# Patient Record
Sex: Female | Born: 2000 | Race: Black or African American | Hispanic: No | Marital: Single | Smoking: Former smoker
Health system: Southern US, Community
[De-identification: ages and names within clinical notes are randomized; demographics above are authoritative.]

## PROBLEM LIST (undated history)

## (undated) ENCOUNTER — Inpatient Hospital Stay (HOSPITAL_COMMUNITY): Payer: Self-pay

## (undated) DIAGNOSIS — J45909 Unspecified asthma, uncomplicated: Secondary | ICD-10-CM

## (undated) DIAGNOSIS — G43909 Migraine, unspecified, not intractable, without status migrainosus: Secondary | ICD-10-CM

## (undated) DIAGNOSIS — O139 Gestational [pregnancy-induced] hypertension without significant proteinuria, unspecified trimester: Secondary | ICD-10-CM

## (undated) HISTORY — PX: WISDOM TOOTH EXTRACTION: SHX21

## (undated) HISTORY — DX: Gestational (pregnancy-induced) hypertension without significant proteinuria, unspecified trimester: O13.9

## (undated) NOTE — *Deleted (*Deleted)
Checking on the patient at 1050 she tells me she is not feeling well. States back pain, nausea and dizziness.  Second dose of feraheme was completed at 1038. VS taken with blood pressure slightly lower that admission at 96/69 pulse 96 O2 sat at 100. 1 L NS started at 1055. Call placed to MD. Orders received at 1105 and Zofran given as ordered.  Fetal heart rate at 140 at 1057.  No emesis at this time but pt remains nauseous and dizzy.   1148 BP 91/66, P77, O2 sat 99. Pt states nausea is slightly better but is very sleepy at this point. Will continue to monitor  1220 Pt asked to get up to the bathroom. When she was up she stated that her back pain continues on left side along with numbness and aching in upper legs and lower abdomen. Nausea and sleepyness seem. better but she is chilling. VS 101/60 Pulse 76 Resp 18 Sats 100%. Called Dr Adrian Blackwater and he said to bring her to maturity assessment unit.  Brought pt over there at 1245 and report called to Imagene Sheller, RN

---
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
## 2020-01-04 ENCOUNTER — Encounter (HOSPITAL_COMMUNITY): Payer: Self-pay | Admitting: Family Medicine

## 2020-01-04 ENCOUNTER — Inpatient Hospital Stay (HOSPITAL_COMMUNITY)
Admission: AD | Admit: 2020-01-04 | Discharge: 2020-01-07 | DRG: 832 | Disposition: A | Discharge status: Home / Self Care | Payer: Medicaid Other | Attending: Family Medicine | Admitting: Family Medicine

## 2020-01-04 ENCOUNTER — Other Ambulatory Visit: Payer: Self-pay

## 2020-01-04 DIAGNOSIS — Z1639 Resistance to other specified antimicrobial drug: Secondary | ICD-10-CM | POA: Diagnosis not present

## 2020-01-04 DIAGNOSIS — A549 Gonococcal infection, unspecified: Secondary | ICD-10-CM | POA: Diagnosis present

## 2020-01-04 DIAGNOSIS — A568 Sexually transmitted chlamydial infection of other sites: Secondary | ICD-10-CM

## 2020-01-04 DIAGNOSIS — R8271 Bacteriuria: Secondary | ICD-10-CM

## 2020-01-04 DIAGNOSIS — Z20822 Contact with and (suspected) exposure to covid-19: Secondary | ICD-10-CM | POA: Diagnosis present

## 2020-01-04 DIAGNOSIS — O2302 Infections of kidney in pregnancy, second trimester: Secondary | ICD-10-CM | POA: Diagnosis not present

## 2020-01-04 DIAGNOSIS — O98212 Gonorrhea complicating pregnancy, second trimester: Secondary | ICD-10-CM | POA: Diagnosis present

## 2020-01-04 DIAGNOSIS — O09892 Supervision of other high risk pregnancies, second trimester: Secondary | ICD-10-CM | POA: Diagnosis not present

## 2020-01-04 DIAGNOSIS — N390 Urinary tract infection, site not specified: Secondary | ICD-10-CM | POA: Diagnosis not present

## 2020-01-04 DIAGNOSIS — Z3A23 23 weeks gestation of pregnancy: Secondary | ICD-10-CM

## 2020-01-04 DIAGNOSIS — O09212 Supervision of pregnancy with history of pre-term labor, second trimester: Secondary | ICD-10-CM | POA: Diagnosis not present

## 2020-01-04 DIAGNOSIS — B964 Proteus (mirabilis) (morganii) as the cause of diseases classified elsewhere: Secondary | ICD-10-CM | POA: Diagnosis not present

## 2020-01-04 DIAGNOSIS — O09899 Supervision of other high risk pregnancies, unspecified trimester: Secondary | ICD-10-CM

## 2020-01-04 DIAGNOSIS — O09299 Supervision of pregnancy with other poor reproductive or obstetric history, unspecified trimester: Secondary | ICD-10-CM

## 2020-01-04 DIAGNOSIS — R509 Fever, unspecified: Secondary | ICD-10-CM | POA: Diagnosis not present

## 2020-01-04 DIAGNOSIS — Z3480 Encounter for supervision of other normal pregnancy, unspecified trimester: Secondary | ICD-10-CM

## 2020-01-04 DIAGNOSIS — IMO0002 Reserved for concepts with insufficient information to code with codable children: Secondary | ICD-10-CM

## 2020-01-04 DIAGNOSIS — Z362 Encounter for other antenatal screening follow-up: Secondary | ICD-10-CM | POA: Diagnosis not present

## 2020-01-04 DIAGNOSIS — O99012 Anemia complicating pregnancy, second trimester: Secondary | ICD-10-CM | POA: Diagnosis not present

## 2020-01-04 DIAGNOSIS — D649 Anemia, unspecified: Secondary | ICD-10-CM | POA: Diagnosis not present

## 2020-01-04 DIAGNOSIS — Z1629 Resistance to other single specified antibiotic: Secondary | ICD-10-CM | POA: Diagnosis present

## 2020-01-04 DIAGNOSIS — O368320 Maternal care for abnormalities of the fetal heart rate or rhythm, second trimester, not applicable or unspecified: Secondary | ICD-10-CM | POA: Diagnosis present

## 2020-01-04 DIAGNOSIS — O09292 Supervision of pregnancy with other poor reproductive or obstetric history, second trimester: Secondary | ICD-10-CM | POA: Diagnosis not present

## 2020-01-04 LAB — CBC
HCT: 31.3 % — ABNORMAL LOW (ref 36.0–46.0)
Hemoglobin: 10.3 g/dL — ABNORMAL LOW (ref 12.0–15.0)
MCH: 28.5 pg (ref 26.0–34.0)
MCHC: 32.9 g/dL (ref 30.0–36.0)
MCV: 86.5 fL (ref 80.0–100.0)
Platelets: 283 10*3/uL (ref 150–400)
RBC: 3.62 MIL/uL — ABNORMAL LOW (ref 3.87–5.11)
RDW: 11.6 % (ref 11.5–15.5)
WBC: 14.3 10*3/uL — ABNORMAL HIGH (ref 4.0–10.5)
nRBC: 0 % (ref 0.0–0.2)

## 2020-01-04 LAB — DIFFERENTIAL
Abs Immature Granulocytes: 0.08 10*3/uL — ABNORMAL HIGH (ref 0.00–0.07)
Basophils Absolute: 0 10*3/uL (ref 0.0–0.1)
Basophils Relative: 0 %
Eosinophils Absolute: 0 10*3/uL (ref 0.0–0.5)
Eosinophils Relative: 0 %
Immature Granulocytes: 1 %
Lymphocytes Relative: 7 %
Lymphs Abs: 1.1 10*3/uL (ref 0.7–4.0)
Monocytes Absolute: 1.1 10*3/uL — ABNORMAL HIGH (ref 0.1–1.0)
Monocytes Relative: 8 %
Neutro Abs: 12 10*3/uL — ABNORMAL HIGH (ref 1.7–7.7)
Neutrophils Relative %: 84 %

## 2020-01-04 LAB — COMPREHENSIVE METABOLIC PANEL
ALT: 10 U/L (ref 0–44)
AST: 12 U/L — ABNORMAL LOW (ref 15–41)
Albumin: 2.8 g/dL — ABNORMAL LOW (ref 3.5–5.0)
Alkaline Phosphatase: 45 U/L (ref 38–126)
Anion gap: 12 (ref 5–15)
BUN: <5 mg/dL — ABNORMAL LOW (ref 6–20)
CO2: 19 mmol/L — ABNORMAL LOW (ref 22–32)
Calcium: 8.6 mg/dL — ABNORMAL LOW (ref 8.9–10.3)
Chloride: 99 mmol/L (ref 98–111)
Creatinine, Ser: 0.74 mg/dL (ref 0.44–1.00)
GFR calc Af Amer: >60 mL/min (ref >60)
GFR calc non Af Amer: >60 mL/min (ref >60)
Glucose, Bld: 82 mg/dL (ref 70–99)
Potassium: 3.2 mmol/L — ABNORMAL LOW (ref 3.5–5.1)
Sodium: 130 mmol/L — ABNORMAL LOW (ref 135–145)
Total Bilirubin: 1 mg/dL (ref 0.3–1.2)
Total Protein: 6.4 g/dL — ABNORMAL LOW (ref 6.5–8.1)

## 2020-01-04 LAB — URINALYSIS, ROUTINE W REFLEX MICROSCOPIC
Bilirubin Urine: NEGATIVE
Glucose, UA: NEGATIVE mg/dL
Hgb urine dipstick: NEGATIVE
Ketones, ur: 80 mg/dL — AB
Nitrite: NEGATIVE
Protein, ur: 30 mg/dL — AB
Specific Gravity, Urine: 1.024 (ref 1.005–1.030)
pH: 7 (ref 5.0–8.0)

## 2020-01-04 LAB — TYPE AND SCREEN
ABO/RH(D): A POS
Antibody Screen: NEGATIVE

## 2020-01-04 LAB — LACTIC ACID, PLASMA: Lactic Acid, Venous: 0.9 mmol/L (ref 0.5–1.9)

## 2020-01-04 LAB — WET PREP, GENITAL
Clue Cells Wet Prep HPF POC: NONE SEEN
Sperm: NONE SEEN
Trich, Wet Prep: NONE SEEN
Yeast Wet Prep HPF POC: NONE SEEN

## 2020-01-04 LAB — SARS CORONAVIRUS 2 BY RT PCR (HOSPITAL ORDER, PERFORMED IN ~~LOC~~ HOSPITAL LAB): SARS Coronavirus 2: NEGATIVE

## 2020-01-04 MED ORDER — LACTATED RINGERS IV SOLN: INTRAVENOUS | Status: DC

## 2020-01-04 MED ORDER — ACETAMINOPHEN 325 MG PO TABS
650.0000 mg | ORAL_TABLET | ORAL | Status: DC | PRN
  Administered 2020-01-05 – 2020-01-06 (×3): 650 mg via ORAL
  Filled 2020-01-04 (×3): qty 2

## 2020-01-04 MED ORDER — PRENATAL MULTIVITAMIN CH
1.0000 | ORAL_TABLET | Freq: Every day | ORAL | Status: DC
  Administered 2020-01-05 – 2020-01-07 (×3): 1 via ORAL
  Filled 2020-01-04 (×3): qty 1

## 2020-01-04 MED ORDER — DOCUSATE SODIUM 100 MG PO CAPS
100.0000 mg | ORAL_CAPSULE | Freq: Every day | ORAL | Status: DC
  Administered 2020-01-05: 100 mg via ORAL
  Filled 2020-01-04 (×2): qty 1

## 2020-01-04 MED ORDER — ENOXAPARIN SODIUM 40 MG/0.4ML ~~LOC~~ SOLN
40.0000 mg | Freq: Every day | SUBCUTANEOUS | Status: DC
  Administered 2020-01-05 – 2020-01-06 (×3): 40 mg via SUBCUTANEOUS
  Filled 2020-01-04 (×3): qty 0.4

## 2020-01-04 MED ORDER — CALCIUM CARBONATE ANTACID 500 MG PO CHEW
2.0000 | CHEWABLE_TABLET | ORAL | Status: DC | PRN
  Administered 2020-01-06: 400 mg via ORAL
  Filled 2020-01-04: qty 2

## 2020-01-04 MED ORDER — ASPIRIN EC 81 MG PO TBEC
81.0000 mg | DELAYED_RELEASE_TABLET | Freq: Every day | ORAL | Status: DC
  Administered 2020-01-05 – 2020-01-07 (×3): 81 mg via ORAL
  Filled 2020-01-04 (×3): qty 1

## 2020-01-04 MED ORDER — ZOLPIDEM TARTRATE 5 MG PO TABS: 5.0000 mg | ORAL_TABLET | Freq: Every evening | ORAL | Status: DC | PRN

## 2020-01-04 MED ORDER — ACETAMINOPHEN 500 MG PO TABS
1000.0000 mg | ORAL_TABLET | Freq: Once | ORAL | Status: AC
  Administered 2020-01-04: 1000 mg via ORAL
  Filled 2020-01-04: qty 2

## 2020-01-04 MED ORDER — SODIUM CHLORIDE 0.9 % IV SOLN
2.0000 g | INTRAVENOUS | Status: DC
  Administered 2020-01-04 – 2020-01-06 (×3): 2 g via INTRAVENOUS
  Filled 2020-01-04: qty 2
  Filled 2020-01-04 (×3): qty 20

## 2020-01-04 MED ORDER — IBUPROFEN 600 MG PO TABS
600.0000 mg | ORAL_TABLET | Freq: Four times a day (QID) | ORAL | Status: DC | PRN
  Administered 2020-01-05 (×2): 600 mg via ORAL
  Filled 2020-01-04 (×2): qty 1

## 2020-01-04 NOTE — MAU Note (Signed)
Pt SO brought to bedside. SO verbalizes understanding regarding visitor policy.

## 2020-01-04 NOTE — MAU Note (Signed)
Presents with H/A, chills, and body aches since yesterday.  Reports took Tylenol @ 1400 for symptoms.  Denies VB, reports LOF fluid since 0300 but stopped @ 1300.  Reports decreased FM, no movement since 0500.Marland Kitchen

## 2020-01-04 NOTE — MAU Provider Note (Signed)
FACULTY PRACTICE ANTEPARTUM ADMISSION HISTORY AND PHYSICAL NOTE   History of Present Illness: [place]queline [place] 19 y.o. 158w2d  [facility]  Comes to MAU with fever for 2 days.  Has not checked her temp at home but has felt bad - hot and with a headache - and today took 2 pills of Tylenol and did get any relief from it.  Currently has chills with fever and cannot get warm.  Has not had cough, runny nose, sneezing, or shortness of breath.  Did have a nose bleed.  Has not had dysuria or vaginal bleeding, but did have vaginal leaking that she thought might have been her water breaking but has not had any leaking since 1 pm today.  Has had a headache and earlier today had some back pain across her lower back that resolved once she was in the bed in MAU.  Patient Active Problem List   Diagnosis Date Noted  . UTI (urinary tract infection) 01/04/2020  . Nausea and vomiting during pregnancy prior to [redacted] weeks gestation 11/10/2019  . GBS bacteriuria 10/11/2019  . Hx of preeclampsia, prior pregnancy, currently pregnant 10/06/2019  . Hx of postpartum hemorrhage, currently pregnant 10/06/2019  . Short interval between pregnancies complicating pregnancy, antepartum 10/06/2019  . Chlamydia trachomatis infection during pregnancy in first trimester 10/06/2019  . Current nicotine use 10/06/2019  . Supervision of other normal pregnancy, antepartum 09/29/2019    Past Medical History:  Diagnosis Date  . Asthma   . Migraines   . Pregnancy induced hypertension     Past Surgical History:  Procedure Laterality Date  . WISDOM TOOTH EXTRACTION      OB History  Gravida Para Term Preterm AB Living  2 1 0 1 0 2  SAB TAB Ectopic Multiple Live Births  0 0 0 1 2    # Outcome Date GA Lbr Len/2nd Weight Sex Delivery Anes PTL Lv  2 Current           1A Preterm 10/27/18 5738w3d 06:39 / 00:26 2185 g F Vag-Spont EPI  LIV  1B Preterm 10/27/18 138w3d 06:39 / 00:32 2565 g F Vag-Spont EPI   LIV    Social History   Socioeconomic History  . Marital status: Single    Spouse name: Not on file  . Number of children: Not on file  . Years of education: Not on file  . Highest education level: Not on file  Occupational History  . Not on file  Tobacco Use  . Smoking status: Never Smoker  . Smokeless tobacco: Never Used  Vaping Use  . Vaping Use: Former  Substance and Sexual Activity  . Alcohol use: Never    Alcohol/week: 0.0 standard drinks  . Drug use: Not Currently    Types: Marijuana    Comment: NOT SMOKED DURING PREG  . Sexual activity: Yes    Comment: pregnant   Other Topics Concern  . Not on file  Social History Narrative   Lives with:  Godmother, going well   School:  is in 9th grade and is doing okay, at [employer]   Support Systems:  Mother, aunt and godmother   Future Plans:  college   Exercise:  Cheerleading      Confidentiality was discussed with the patient and if applicable, with caregiver as well.      Patient's personal or confidential phone number: [phone]   Tobacco?  yes, has tried blacks, thinking she is going to stop  Drugs/ETOH?  no   Partner preference?  female Sexually Active?  yes, 1 partner    Pregnancy Prevention:  none, reviewed condoms & plan B   Safe at home, in school & in relationships?  yes   Safe to self?   yes, previous SI, wellcontrolled with care plan now   Guns in the home?  no       Social Determinants of Health   Financial Resource Strain:   . Difficulty of Paying Living Expenses:   Food Insecurity:   . Worried About Programme researcher, broadcasting/film/video in the Last Year:   . Barista in the Last Year:   Transportation Needs:   . Freight forwarder (Medical):   Marland Kitchen Lack of Transportation (Non-Medical):   Physical Activity:   . Days of Exercise per Week:   . Minutes of Exercise per Session:   Stress:   . Feeling of Stress :   Social Connections:   . Frequency of Communication with Friends and Family:   . Frequency of  Social Gatherings with Friends and Family:   . Attends Religious Services:   . Active Member of Clubs or Organizations:   . Attends Banker Meetings:   Marland Kitchen Marital Status:     Family History  Problem Relation Age of Onset  . Hypertension Mother   . Asthma Brother   . Hypertension Brother   . Diabetes Brother   . Hypertension Maternal Aunt   . Diabetes Maternal Aunt   . Autism spectrum disorder Maternal Aunt   . Hypertension Maternal Grandmother   . Aneurysm Maternal Uncle     Allergies  Allergen Reactions  . Tomato Other (See Comments)    Makes asthma worse    Medications Prior to Admission  Medication Sig Dispense Refill Last Dose  . aspirin EC 81 MG tablet Take 1 tablet (81 mg total) by mouth daily. Take after 12 weeks for prevention of preeclampsia later in pregnancy. Start taking on May 18th 300 tablet 0   . cefadroxil (DURICEF) 500 MG capsule Take 1 capsule (500 mg total) by mouth 2 (two) times daily. (Patient not taking: Reported on 12/08/2019) 14 capsule 0   . Doxylamine-Pyridoxine (DICLEGIS) 10-10 MG TBEC Take 2 tablets at bedtime and one in the morning and one in the afternoon as needed for nausea. (Patient not taking: Reported on 12/08/2019) 60 tablet 2   . famotidine (PEPCID) 20 MG tablet Take 1 tablet (20 mg total) by mouth 2 (two) times daily. 60 tablet 0   . prenatal vitamin w/FE, FA (PRENATAL 1 + 1) 27-1 MG TABS tablet Take 1 tablet by mouth daily at 12 noon.     Marland Kitchen terconazole (TERAZOL 7) 0.4 % vaginal cream Place 1 applicator vaginally at bedtime. Use for seven days (Patient not taking: Reported on 12/08/2019) 45 g 0     Review of Systems Constitutional: Positive for fever.  HENT: Positive for nosebleeds. Negative for rhinorrhea and sore throat.   Respiratory: Negative for cough and shortness of breath.   Gastrointestinal: Positive for diarrhea. Negative for abdominal pain, nausea and vomiting.  Genitourinary: Positive for vaginal discharge. Negative for  dysuria and vaginal bleeding.  Neurological: Positive for headaches.  Musculoskeletal:  Back pain earlier today.  Vitals:  BP 121/68   Pulse (!) 113   Temp (!) 102.2 F (39 C) (Oral)   Resp 20   Ht  (1.6 m)   Wt 69.7 kg   LMP 07/07/2019  SpO2 99%   BMI 27.23 kg/m    Physical Exam Vitals and nursing note reviewed.  Constitutional:      Appearance: She is well-developed.     Comments: Chills with fever  HENT:     Head: Normocephalic.  Abdominal:     Palpations: Abdomen is soft.     Tenderness: There is no abdominal tenderness. There is no guarding or rebound.     Comments: FHT 170 baseline with moderate variability.  No contractions.  Hard to trace FHT consistently.  No decelerations.  Genitourinary: No CVA tenderness    Comments: Speculum exam: Vagina - Small amount of white discharge, no odor, no blood Cervix - No contact bleeding, no leaking with valsalva, cervix appears closed. Bimanual exam deferred GC/Chlam, wet prep done Chaperone present for exam.  Musculoskeletal:        General: Normal range of motion.     Cervical back: Neck supple.  Skin:    General: Skin is warm and dry.     Findings: No rash.  Neurological:     Mental Status: She is alert and oriented to person, place, and time.    Cervix: Evaluated by sterile speculum exam. and found to be visually closed. Membranes:intact - no leaking seen on sterile speculum exam Fetal Monitoring:Baseline: 170 bpm Tocometer: no contractions  Labs:  Results for orders placed or performed during the hospital encounter of 01/04/20 (from the past 24 hour(s))  Urinalysis, Routine w reflex microscopic   Collection Time: 01/04/20  6:24 PM  Result Value Ref Range   Color, Urine AMBER (A) YELLOW   APPearance HAZY (A) CLEAR   Specific Gravity, Urine 1.024 1.005 - 1.030   pH 7.0 5.0 - 8.0   Glucose, UA NEGATIVE NEGATIVE mg/dL   Hgb urine dipstick NEGATIVE NEGATIVE   Bilirubin Urine NEGATIVE NEGATIVE   Ketones, ur  80 (A) NEGATIVE mg/dL   Protein, ur 30 (A) NEGATIVE mg/dL   Nitrite NEGATIVE NEGATIVE   Leukocytes,Ua MODERATE (A) NEGATIVE   RBC / HPF 0-5 0 - 5 RBC/hpf   WBC, UA 11-20 0 - 5 WBC/hpf   Bacteria, UA MANY (A) NONE SEEN   Squamous Epithelial / LPF 11-20 0 - 5   Mucus PRESENT   SARS Coronavirus 2 by RT PCR (hospital order, performed in [facility] hospital lab) Nasopharyngeal Nasopharyngeal Swab   Collection Time: 01/04/20  6:55 PM   Specimen: Nasopharyngeal Swab  Result Value Ref Range   SARS Coronavirus 2 NEGATIVE NEGATIVE  Wet prep, genital   Collection Time: 01/04/20  7:10 PM  Result Value Ref Range   Yeast Wet Prep HPF POC NONE SEEN NONE SEEN   Trich, Wet Prep NONE SEEN NONE SEEN   Clue Cells Wet Prep HPF POC NONE SEEN NONE SEEN   WBC, Wet Prep HPF POC MANY (A) NONE SEEN   Sperm NONE SEEN   CBC   Collection Time: 01/04/20  7:37 PM  Result Value Ref Range   WBC 14.3 (H) 4.0 - 10.5 K/uL   RBC 3.62 (L) 3.87 - 5.11 MIL/uL   Hemoglobin 10.3 (L) 12.0 - 15.0 g/dL   HCT 57.8 (L) 36 - 46 %   MCV 86.5 80.0 - 100.0 fL   MCH 28.5 26.0 - 34.0 pg   MCHC 32.9 30.0 - 36.0 g/dL   RDW 46.9 62.9 - 52.8 %   Platelets 283 150 - 400 K/uL   nRBC 0.0 0.0 - 0.2 %  Comprehensive metabolic panel   Collection  Time: 01/04/20  7:37 PM  Result Value Ref Range   Sodium 130 (L) 135 - 145 mmol/L   Potassium 3.2 (L) 3.5 - 5.1 mmol/L   Chloride 99 98 - 111 mmol/L   CO2 19 (L) 22 - 32 mmol/L   Glucose, Bld 82 70 - 99 mg/dL   BUN <5 (L) 6 - 20 mg/dL   Creatinine, Ser 1.19 0.44 - 1.00 mg/dL   Calcium 8.6 (L) 8.9 - 10.3 mg/dL   Total Protein 6.4 (L) 6.5 - 8.1 g/dL   Albumin 2.8 (L) 3.5 - 5.0 g/dL   AST 12 (L) 15 - 41 U/L   ALT 10 0 - 44 U/L   Alkaline Phosphatase 45 38 - 126 U/L   Total Bilirubin 1.0 0.3 - 1.2 mg/dL   GFR calc non Af Amer >60 >60 mL/min   GFR calc Af Amer >60 >60 mL/min   Anion gap 12 5 - 15  Differential   Collection Time: 01/04/20  7:37 PM  Result Value Ref Range   Neutrophils  Relative % 84 %   Neutro Abs 12.0 (H) 1.7 - 7.7 K/uL   Lymphocytes Relative 7 %   Lymphs Abs 1.1 0.7 - 4.0 K/uL   Monocytes Relative 8 %   Monocytes Absolute 1.1 (H) 0 - 1 K/uL   Eosinophils Relative 0 %   Eosinophils Absolute 0.0 0 - 0 K/uL   Basophils Relative 0 %   Basophils Absolute 0.0 0 - 0 K/uL   Immature Granulocytes 1 %   Abs Immature Granulocytes 0.08 (H) 0.00 - 0.07 K/uL    Imaging Studies: [place] MFM OB DETAIL +14 WK  Result Date: 12/08/2019 ----------------------------------------------------------------------  OBSTETRICS REPORT                       (Signed Final 12/08/2019 11:51 am) ---------------------------------------------------------------------- Patient Info  ID #:       [id number]                          D.O.B.:  02/11/2001 (19 yrs)  Name:       Jacqueline [name]                Visit Date: 12/08/2019 10:22 am ---------------------------------------------------------------------- Performed By  Attending:        Ma Rings MD         Ref. Address:     [address]                                                             [address] [phone]  [place]                                                             [address]  Performed By:     Earley Brooke     [facility], RD[facility]                                 [facility]  Referred By:      [facility] ---------------------------------------------------------------------- Orders  #  Description                           Code        Ordered By  1  [place] MFM OB DETAIL +14 WK               76811.01    Steward Drone ----------------------------------------------------------------------  #  Order #                     Accession #                Episode #  1  [id number]                   [id number]                 [id number]  ---------------------------------------------------------------------- Indications  Poor obstetric history: Previous               O09.299  preeclampsia / eclampsia/gestational HTN  Encounter for antenatal screening for          Z36.3  malformations  [redacted] weeks gestation of pregnancy                Z3A.19  Tobacco use complicating pregnancy,            O99.332  second trimester  Poor obstetric history: Previous preterm       O09.219  delivery, antepartum (Twins at 36 weeks)  Short interval between pregancies, 2nd         O09.892  trimester (Delivered 10-27-2018)  LOW Risk NIPS     Negative Horizon ---------------------------------------------------------------------- Fetal Evaluation  Num Of Fetuses:         1  Fetal Heart Rate(bpm):  166  Cardiac Activity:       Observed  Presentation:           Breech  Placenta:               Anterior  P. Cord Insertion:      Visualized  Amniotic Fluid  AFI FV:      Within normal limits                              Largest Pocket(cm)                              6.2 ---------------------------------------------------------------------- Biometry  BPD:  46.8  mm     G. Age:  20w 1d         79  %    CI:        72.23   %    70 - 86                                                          FL/HC:      17.3   %    16.1 - 18.3  HC:      175.2  mm     G. Age:  20w 0d         71  %    HC/AC:      1.23        1.09 - 1.39  AC:       143   mm     G. Age:  19w 5d         53  %    FL/BPD:     64.7   %  FL:       30.3  mm     G. Age:  19w 3d         40  %    FL/AC:      21.2   %    20 - 24  HUM:      29.1  mm     G. Age:  19w 3d         53  %  CER:      21.3  mm     G. Age:  20w 1d         85  %  NFT:       3.0  mm  LV:        5.8  mm  CM:        5.9  mm  Est. FW:     301  gm    0 lb 11 oz      55  % ---------------------------------------------------------------------- Gestational Age  U/S Today:     19w 6d                                        EDD:   04/27/20  Best:          19w 3d     Det.  By:  Previous Ultrasound      EDD:   04/30/20                                      (09/06/19) ---------------------------------------------------------------------- Anatomy  Cranium:               Appears normal         Aortic Arch:            Appears normal  Cavum:                 Appears normal         Ductal Arch:            Appears normal  Ventricles:  Appears normal         Diaphragm:              Appears normal  Choroid Plexus:        Appears normal         Stomach:                Appears normal, left                                                                        sided  Cerebellum:            Appears normal         Abdomen:                Appears normal  Posterior Fossa:       Appears normal         Abdominal Wall:         Appears nml (cord                                                                        insert, abd wall)  Nuchal Fold:           Appears normal         Cord Vessels:           Appears normal (3                                                                        vessel cord)  Face:                  Appears normal         Kidneys:                Appear normal                         (orbits and profile)  Lips:                  Appears normal         Bladder:                Appears normal  Thoracic:              Appears normal         Spine:                  Appears normal  Heart:                 Appears normal         Upper Extremities:      Appears normal                         (  4CH, axis, and                         situs)  RVOT:                  Appears normal         Lower Extremities:      Appears normal  LVOT:                  Not well visualized  Other:  Fetus appears to be a female. Heels visualized. Nasal bone visualized.          Open hands visualized. ---------------------------------------------------------------------- Cervix Uterus Adnexa  Cervix  Length:           3.06  cm.  Normal appearance by transabdominal scan.  Right Ovary  Within normal limits.  Left  Ovary  Within normal limits.  Adnexa  No abnormality visualized. ---------------------------------------------------------------------- Comments  This patient was seen for a detailed fetal anatomy scan.  She denies any significant past medical history and denies  any problems in her current pregnancy.  She had a cell free DNA test earlier in her pregnancy which  indicated a low risk for trisomy 33, 43, and 13. A female fetus is  predicted.  She was informed that the fetal growth and amniotic fluid  level were appropriate for her gestational age.  There were no obvious fetal anomalies noted on today's  ultrasound exam.  However, the views of the fetal anatomy  were limited today due to the fetal position.  The patient was informed that anomalies may be missed due  to technical limitations. If the fetus is in a suboptimal position  or maternal habitus is increased, visualization of the fetus in  the maternal uterus may be impaired.  A follow-up exam was scheduled in 4 weeks to complete the  views of the fetal anatomy. ----------------------------------------------------------------------                   Ma Rings, MD Electronically Signed Final Report   12/08/2019 11:51 am ----------------------------------------------------------------------    Assessment and Plan: Patient Active Problem List   Diagnosis Date Noted  . UTI (urinary tract infection) 01/04/2020  . Nausea and vomiting during pregnancy prior to [redacted] weeks gestation 11/10/2019  . GBS bacteriuria 10/11/2019  . Hx of preeclampsia, prior pregnancy, currently pregnant 10/06/2019  . Hx of postpartum hemorrhage, currently pregnant 10/06/2019  . Short interval between pregnancies complicating pregnancy, antepartum 10/06/2019  . Chlamydia trachomatis infection during pregnancy in first trimester 10/06/2019  . Current nicotine use 10/06/2019  . Supervision of other normal pregnancy, antepartum 09/29/2019   Assessment  Fever - unknown cause but  could be pyelo or sepsis, Fetal tachycardia Headache Maternal tachycardia Negative Covid test - no respiratory symptoms Consult with Dr. Adrian Blackwater and reviewed symptoms and labs Admit to Antenatal - Dr. Adrian Blackwater to place orders Will switch IV fluids to normal saline for Rocephin infusion. Urine culture pending. Gc/Chlam pending.  Nolene Bernheim, RN, MSN, NP-BC Nurse Practitioner, [facility] [employer] [facility], [facility] 01/04/2020 9:01 PM

## 2020-01-05 ENCOUNTER — Inpatient Hospital Stay (HOSPITAL_BASED_OUTPATIENT_CLINIC_OR_DEPARTMENT_OTHER): Payer: Medicaid Other

## 2020-01-05 ENCOUNTER — Ambulatory Visit: Payer: Medicaid Other

## 2020-01-05 DIAGNOSIS — O09212 Supervision of pregnancy with history of pre-term labor, second trimester: Secondary | ICD-10-CM | POA: Diagnosis not present

## 2020-01-05 DIAGNOSIS — F17200 Nicotine dependence, unspecified, uncomplicated: Secondary | ICD-10-CM

## 2020-01-05 DIAGNOSIS — D649 Anemia, unspecified: Secondary | ICD-10-CM

## 2020-01-05 DIAGNOSIS — Z3A23 23 weeks gestation of pregnancy: Secondary | ICD-10-CM

## 2020-01-05 DIAGNOSIS — O09292 Supervision of pregnancy with other poor reproductive or obstetric history, second trimester: Secondary | ICD-10-CM

## 2020-01-05 DIAGNOSIS — O2302 Infections of kidney in pregnancy, second trimester: Principal | ICD-10-CM

## 2020-01-05 DIAGNOSIS — O99012 Anemia complicating pregnancy, second trimester: Secondary | ICD-10-CM

## 2020-01-05 DIAGNOSIS — N390 Urinary tract infection, site not specified: Secondary | ICD-10-CM

## 2020-01-05 DIAGNOSIS — Z362 Encounter for other antenatal screening follow-up: Secondary | ICD-10-CM

## 2020-01-05 DIAGNOSIS — O99332 Smoking (tobacco) complicating pregnancy, second trimester: Secondary | ICD-10-CM

## 2020-01-05 DIAGNOSIS — O09892 Supervision of other high risk pregnancies, second trimester: Secondary | ICD-10-CM | POA: Diagnosis not present

## 2020-01-05 LAB — CBC
HCT: 27.1 % — ABNORMAL LOW (ref 36.0–46.0)
Hemoglobin: 9.2 g/dL — ABNORMAL LOW (ref 12.0–15.0)
MCH: 29.3 pg (ref 26.0–34.0)
MCHC: 33.9 g/dL (ref 30.0–36.0)
MCV: 86.3 fL (ref 80.0–100.0)
Platelets: 253 10*3/uL (ref 150–400)
RBC: 3.14 MIL/uL — ABNORMAL LOW (ref 3.87–5.11)
RDW: 11.7 % (ref 11.5–15.5)
WBC: 13.6 10*3/uL — ABNORMAL HIGH (ref 4.0–10.5)
nRBC: 0 % (ref 0.0–0.2)

## 2020-01-05 LAB — GC/CHLAMYDIA PROBE AMP (~~LOC~~) NOT AT ARMC
Chlamydia: NEGATIVE
Comment: NEGATIVE
Comment: NORMAL
Neisseria Gonorrhea: POSITIVE — AB

## 2020-01-05 LAB — LACTIC ACID, PLASMA: Lactic Acid, Venous: 1.1 mmol/L (ref 0.5–1.9)

## 2020-01-05 MED ORDER — FERROUS SULFATE 325 (65 FE) MG PO TABS
325.0000 mg | ORAL_TABLET | ORAL | Status: DC
  Administered 2020-01-05 – 2020-01-07 (×2): 325 mg via ORAL
  Filled 2020-01-05 (×2): qty 1

## 2020-01-05 MED ORDER — FERROUS SULFATE 325 (65 FE) MG PO TABS: 325.0000 mg | ORAL_TABLET | Freq: Three times a day (TID) | ORAL | Status: DC

## 2020-01-05 MED ORDER — CEFTRIAXONE SODIUM 500 MG IJ SOLR: 500.0000 mg | INTRAMUSCULAR | Status: DC

## 2020-01-05 NOTE — H&P (Signed)
FACULTY PRACTICE ANTEPARTUM ADMISSION HISTORY AND PHYSICAL NOTE   History of Present Illness: [place] 19 y.o. 158w2d  [facility]  Comes to [facility]U with fever for 2 days.  Has not checked her temp at home but has felt bad - hot and with a headache - and today took 2 pills of Tylenol and did get any relief from it.  Currently has chills with fever and cannot get warm.  Has not had cough, runny nose, sneezing, or shortness of breath.  Did have a nose bleed.  Has not had dysuria or vaginal bleeding, but did have vaginal leaking that she thought might have been her water breaking but has not had any leaking since 1 pm today.  Has had a headache and earlier today had some back pain across her lower back that resolved once she was in the bed in MAU.  Patient Active Problem List   Diagnosis Date Noted  . UTI (urinary tract infection) 01/04/2020  . Nausea and vomiting during pregnancy prior to [redacted] weeks gestation 11/10/2019  . GBS bacteriuria 10/11/2019  . Hx of preeclampsia, prior pregnancy, currently pregnant 10/06/2019  . Hx of postpartum hemorrhage, currently pregnant 10/06/2019  . Short interval between pregnancies complicating pregnancy, antepartum 10/06/2019  . Chlamydia trachomatis infection during pregnancy in first trimester 10/06/2019  . Current nicotine use 10/06/2019  . Supervision of other normal pregnancy, antepartum 09/29/2019    Past Medical History:  Diagnosis Date  . Asthma   . Migraines   . Pregnancy induced hypertension     Past Surgical History:  Procedure Laterality Date  . WISDOM TOOTH EXTRACTION      OB History  Gravida Para Term Preterm AB Living  2 1 0 1 0 2  SAB TAB Ectopic Multiple Live Births  0 0 0 1 2    # Outcome Date GA Lbr Len/2nd Weight Sex Delivery Anes PTL Lv  2 Current           1A Preterm 10/27/18 5738w3d 06:39 / 00:26 2185 g F Vag-Spont EPI  LIV  1B Preterm 10/27/18 138w3d 06:39 / 00:32 2565 g F Vag-Spont EPI   LIV    Social History   Socioeconomic History  . Marital status: Single    Spouse name: Not on file  . Number of children: Not on file  . Years of education: Not on file  . Highest education level: Not on file  Occupational History  . Not on file  Tobacco Use  . Smoking status: Never Smoker  . Smokeless tobacco: Never Used  Vaping Use  . Vaping Use: Former  Substance and Sexual Activity  . Alcohol use: Never    Alcohol/week: 0.0 standard drinks  . Drug use: Not Currently    Types: Marijuana    Comment: NOT SMOKED DURING PREG  . Sexual activity: Yes    Comment: pregnant   Other Topics Concern  . Not on file  Social History Narrative   Lives with:  Godmother, going well   School:  is in 9th grade and is doing okay, at [employer]   Support Systems:  Mother, aunt and godmother   Future Plans:  college   Exercise:  Cheerleading      Confidentiality was discussed with the patient and if applicable, with caregiver as well.      Patient's personal or confidential phone number: [phone]   Tobacco?  yes, has tried blacks, thinking she is going to stop  Drugs/ETOH?  no   Partner preference?  female Sexually Active?  yes, 1 partner    Pregnancy Prevention:  none, reviewed condoms & plan B   Safe at home, in school & in relationships?  yes   Safe to self?   yes, previous SI, wellcontrolled with care plan now   Guns in the home?  no       Social Determinants of Health   Financial Resource Strain:   . DProgramme researcher, broadcasting/film/videoing Living Expenses:   Food Insecurity:   . Worried About Running Out of Food in the Last Year:   . Barista in the Last Year:   Transportation Needs:   . Freight forwarder (Medical):   Marland Kitchen Lack of Transportation (Non-Medical):   Physical Activity:   . Days of Exercise per Week:   . Minutes of Exercise per Session:   Stress:   . Feeling of Stress :   Social Connections:   . Frequency of Communication with Friends and Family:   . Frequency of  Social Gatherings with Friends and Family:   . Attends Religious Services:   . Active Member of Clubs or Organizations:   . Attends Banker Meetings:   Marland Kitchen Marital Status:     Family History  Problem Relation Age of Onset  . Hypertension Mother   . Asthma Brother   . Hypertension Brother   . Diabetes Brother   . Hypertension Maternal Aunt   . Diabetes Maternal Aunt   . Autism spectrum disorder Maternal Aunt   . Hypertension Maternal Grandmother   . Aneurysm Maternal Uncle     Allergies  Allergen Reactions  . Tomato Other (See Comments)    Makes asthma worse    Medications Prior to Admission  Medication Sig Dispense Refill Last Dose  . aspirin EC 81 MG tablet Take 1 tablet (81 mg total) by mouth daily. Take after 12 weeks for prevention of preeclampsia later in pregnancy. Start taking on May 18th 300 tablet 0   . cefadroxil (DURICEF) 500 MG capsule Take 1 capsule (500 mg total) by mouth 2 (two) times daily. (Patient not taking: Reported on 12/08/2019) 14 capsule 0   . Doxylamine-Pyridoxine (DICLEGIS) 10-10 MG TBEC Take 2 tablets at bedtime and one in the morning and one in the afternoon as needed for nausea. (Patient not taking: Reported on 12/08/2019) 60 tablet 2   . famotidine (PEPCID) 20 MG tablet Take 1 tablet (20 mg total) by mouth 2 (two) times daily. 60 tablet 0   . prenatal vitamin w/FE, FA (PRENATAL 1 + 1) 27-1 MG TABS tablet Take 1 tablet by mouth daily at 12 noon.     Marland Kitchen terconazole (TERAZOL 7) 0.4 % vaginal cream Place 1 applicator vaginally at bedtime. Use for seven days (Patient not taking: Reported on 12/08/2019) 45 g 0     Review of Systems Constitutional: Positive for fever.  HENT: Positive for nosebleeds. Negative for rhinorrhea and sore throat.   Respiratory: Negative for cough and shortness of breath.   Gastrointestinal: Positive for diarrhea. Negative for abdominal pain, nausea and vomiting.  Genitourinary: Positive for vaginal discharge. Negative for  dysuria and vaginal bleeding.  Neurological: Positive for headaches.  Musculoskeletal:  Back pain earlier today.  Vitals:  BP 121/68   Pulse (!) 113   Temp (!) 102.2 F (39 C) (Oral)   Resp 20   Ht  (1.6 m)   Wt 69.7 kg   LMP 07/07/2019  SpO2 99%   BMI 27.23 kg/m    Physical Exam Vitals and nursing note reviewed.  Constitutional:      Appearance: She is well-developed.     Comments: Chills with fever  HENT:     Head: Normocephalic.  Abdominal:     Palpations: Abdomen is soft.     Tenderness: There is no abdominal tenderness. There is no guarding or rebound.     Comments: FHT 170 baseline with moderate variability.  No contractions.  Hard to trace FHT consistently.  No decelerations.  Genitourinary: No CVA tenderness    Comments: Speculum exam: Vagina - Small amount of white discharge, no odor, no blood Cervix - No contact bleeding, no leaking with valsalva, cervix appears closed. Bimanual exam deferred GC/Chlam, wet prep done Chaperone present for exam.  Musculoskeletal:        General: Normal range of motion.     Cervical back: Neck supple.  Skin:    General: Skin is warm and dry.     Findings: No rash.  Neurological:     Mental Status: She is alert and oriented to person, place, and time.     Cervix: Evaluated by sterile speculum exam. and found to be visually closed. Membranes:intact - no leaking seen on sterile speculum exam Fetal Monitoring:Baseline: 170 bpm Tocometer: no contractions  Labs:  Results for orders placed or performed during the hospital encounter of 01/04/20 (from the past 24 hour(s))  Urinalysis, Routine w reflex microscopic   Collection Time: 01/04/20  6:24 PM  Result Value Ref Range   Color, Urine AMBER (A) YELLOW   APPearance HAZY (A) CLEAR   Specific Gravity, Urine 1.024 1.005 - 1.030   pH 7.0 5.0 - 8.0   Glucose, UA NEGATIVE NEGATIVE mg/dL   Hgb urine dipstick NEGATIVE NEGATIVE   Bilirubin Urine NEGATIVE NEGATIVE   Ketones, ur  80 (A) NEGATIVE mg/dL   Protein, ur 30 (A) NEGATIVE mg/dL   Nitrite NEGATIVE NEGATIVE   Leukocytes,Ua MODERATE (A) NEGATIVE   RBC / HPF 0-5 0 - 5 RBC/hpf   WBC, UA 11-20 0 - 5 WBC/hpf   Bacteria, UA MANY (A) NONE SEEN   Squamous Epithelial / LPF 11-20 0 - 5   Mucus PRESENT   SARS Coronavirus 2 by RT PCR (hospital order, performed in [facility] hospital lab) Nasopharyngeal Nasopharyngeal Swab   Collection Time: 01/04/20  6:55 PM   Specimen: Nasopharyngeal Swab  Result Value Ref Range   SARS Coronavirus 2 NEGATIVE NEGATIVE  Wet prep, genital   Collection Time: 01/04/20  7:10 PM  Result Value Ref Range   Yeast Wet Prep HPF POC NONE SEEN NONE SEEN   Trich, Wet Prep NONE SEEN NONE SEEN   Clue Cells Wet Prep HPF POC NONE SEEN NONE SEEN   WBC, Wet Prep HPF POC MANY (A) NONE SEEN   Sperm NONE SEEN   CBC   Collection Time: 01/04/20  7:37 PM  Result Value Ref Range   WBC 14.3 (H) 4.0 - 10.5 K/uL   RBC 3.62 (L) 3.87 - 5.11 MIL/uL   Hemoglobin 10.3 (L) 12.0 - 15.0 g/dL   HCT 75.1 (L) 36 - 46 %   MCV 86.5 80.0 - 100.0 fL   MCH 28.5 26.0 - 34.0 pg   MCHC 32.9 30.0 - 36.0 g/dL   RDW 02.5 85.2 - 77.8 %   Platelets 283 150 - 400 K/uL   nRBC 0.0 0.0 - 0.2 %  Comprehensive metabolic panel  Collection Time: 01/04/20  7:37 PM  Result Value Ref Range   Sodium 130 (L) 135 - 145 mmol/L   Potassium 3.2 (L) 3.5 - 5.1 mmol/L   Chloride 99 98 - 111 mmol/L   CO2 19 (L) 22 - 32 mmol/L   Glucose, Bld 82 70 - 99 mg/dL   BUN <5 (L) 6 - 20 mg/dL   Creatinine, Ser 0.450.74 0.44 - 1.00 mg/dL   Calcium 8.6 (L) 8.9 - 10.3 mg/dL   Total Protein 6.4 (L) 6.5 - 8.1 g/dL   Albumin 2.8 (L) 3.5 - 5.0 g/dL   AST 12 (L) 15 - 41 U/L   ALT 10 0 - 44 U/L   Alkaline Phosphatase 45 38 - 126 U/L   Total Bilirubin 1.0 0.3 - 1.2 mg/dL   GFR calc non Af Amer >60 >60 mL/min   GFR calc Af Amer >60 >60 mL/min   Anion gap 12 5 - 15  Differential   Collection Time: 01/04/20  7:37 PM  Result Value Ref Range   Neutrophils  Relative % 84 %   Neutro Abs 12.0 (H) 1.7 - 7.7 K/uL   Lymphocytes Relative 7 %   Lymphs Abs 1.1 0.7 - 4.0 K/uL   Monocytes Relative 8 %   Monocytes Absolute 1.1 (H) 0 - 1 K/uL   Eosinophils Relative 0 %   Eosinophils Absolute 0.0 0 - 0 K/uL   Basophils Relative 0 %   Basophils Absolute 0.0 0 - 0 K/uL   Immature Granulocytes 1 %   Abs Immature Granulocytes 0.08 (H) 0.00 - 0.07 K/uL    Imaging Studies: US MFM OB DETAIL +14 WK  Result Date: 12/08/2019 ----------------------------------------------------------------------  OBSTETRICS REPORT                       (Signed Final 12/08/2019 11:51 am) ---------------------------------------------------------------------- Patient Info  ID #:       [id number]                          D.O.B.:  03-17-2001 (19 yrs)  Name:       [place]                Visit Date: 12/08/2019 10:22 am ---------------------------------------------------------------------- Performed By  Attending:        Ma RingsVictor Fang MD         Ref. Address:     [address]                                                             [address]                                                             [address] [phone]  [place]                                                             [address]  Performed By:     Earley Brooke     [facility], RD[facility]                                 [facility]  Referred By:      [facility] ---------------------------------------------------------------------- Orders  #  Description                           Code        Ordered By  1  [place] MFM OB DETAIL +14 WK               76811.01    Steward Drone ----------------------------------------------------------------------  #  Order #                     Accession #                Episode #  1  [id number]                   [id number]                 [id number]  ---------------------------------------------------------------------- Indications  Poor obstetric history: Previous               O09.299  preeclampsia / eclampsia/gestational HTN  Encounter for antenatal screening for          Z36.3  malformations  [redacted] weeks gestation of pregnancy                Z3A.19  Tobacco use complicating pregnancy,            O99.332  second trimester  Poor obstetric history: Previous preterm       O09.219  delivery, antepartum (Twins at 36 weeks)  Short interval between pregancies, 2nd         O09.892  trimester (Delivered 10-27-2018)  LOW Risk NIPS     Negative Horizon ---------------------------------------------------------------------- Fetal Evaluation  Num Of Fetuses:         1  Fetal Heart Rate(bpm):  166  Cardiac Activity:       Observed  Presentation:           Breech  Placenta:               Anterior  P. Cord Insertion:      Visualized  Amniotic Fluid  AFI FV:      Within normal limits                              Largest Pocket(cm)                              6.2 ---------------------------------------------------------------------- Biometry  BPD:  46.8  mm     G. Age:  20w 1d         79  %    CI:        72.23   %    70 - 86                                                          FL/HC:      17.3   %    16.1 - 18.3  HC:      175.2  mm     G. Age:  20w 0d         71  %    HC/AC:      1.23        1.09 - 1.39  AC:       143   mm     G. Age:  19w 5d         53  %    FL/BPD:     64.7   %  FL:       30.3  mm     G. Age:  19w 3d         40  %    FL/AC:      21.2   %    20 - 24  HUM:      29.1  mm     G. Age:  19w 3d         53  %  CER:      21.3  mm     G. Age:  20w 1d         85  %  NFT:       3.0  mm  LV:        5.8  mm  CM:        5.9  mm  Est. FW:     301  gm    0 lb 11 oz      55  % ---------------------------------------------------------------------- Gestational Age  U/S Today:     19w 6d                                        EDD:   04/27/20  Best:          19w 3d     Det.  By:  Previous Ultrasound      EDD:   04/30/20                                      (09/06/19) ---------------------------------------------------------------------- Anatomy  Cranium:               Appears normal         Aortic Arch:            Appears normal  Cavum:                 Appears normal         Ductal Arch:            Appears normal  Ventricles:  Appears normal         Diaphragm:              Appears normal  Choroid Plexus:        Appears normal         Stomach:                Appears normal, left                                                                        sided  Cerebellum:            Appears normal         Abdomen:                Appears normal  Posterior Fossa:       Appears normal         Abdominal Wall:         Appears nml (cord                                                                        insert, abd wall)  Nuchal Fold:           Appears normal         Cord Vessels:           Appears normal (3                                                                        vessel cord)  Face:                  Appears normal         Kidneys:                Appear normal                         (orbits and profile)  Lips:                  Appears normal         Bladder:                Appears normal  Thoracic:              Appears normal         Spine:                  Appears normal  Heart:                 Appears normal         Upper Extremities:      Appears normal                         (  4CH, axis, and                         situs)  RVOT:                  Appears normal         Lower Extremities:      Appears normal  LVOT:                  Not well visualized  Other:  Fetus appears to be a female. Heels visualized. Nasal bone visualized.          Open hands visualized. ---------------------------------------------------------------------- Cervix Uterus Adnexa  Cervix  Length:           3.06  cm.  Normal appearance by transabdominal scan.  Right Ovary  Within normal limits.  Left  Ovary  Within normal limits.  Adnexa  No abnormality visualized. ---------------------------------------------------------------------- Comments  This patient was seen for a detailed fetal anatomy scan.  She denies any significant past medical history and denies  any problems in her current pregnancy.  She had a cell free DNA test earlier in her pregnancy which  indicated a low risk for trisomy 36, 16, and 13. A female fetus is  predicted.  She was informed that the fetal growth and amniotic fluid  level were appropriate for her gestational age.  There were no obvious fetal anomalies noted on today's  ultrasound exam.  However, the views of the fetal anatomy  were limited today due to the fetal position.  The patient was informed that anomalies may be missed due  to technical limitations. If the fetus is in a suboptimal position  or maternal habitus is increased, visualization of the fetus in  the maternal uterus may be impaired.  A follow-up exam was scheduled in 4 weeks to complete the  views of the fetal anatomy. ----------------------------------------------------------------------                   Ma Rings, MD Electronically Signed Final Report   12/08/2019 11:51 am ----------------------------------------------------------------------    Assessment and Plan: Patient Active Problem List   Diagnosis Date Noted  . UTI (urinary tract infection) 01/04/2020  . Nausea and vomiting during pregnancy prior to [redacted] weeks gestation 11/10/2019  . GBS bacteriuria 10/11/2019  . Hx of preeclampsia, prior pregnancy, currently pregnant 10/06/2019  . Hx of postpartum hemorrhage, currently pregnant 10/06/2019  . Short interval between pregnancies complicating pregnancy, antepartum 10/06/2019  . Chlamydia trachomatis infection during pregnancy in first trimester 10/06/2019  . Current nicotine use 10/06/2019  . Supervision of other normal pregnancy, antepartum 09/29/2019   Assessment  Fever - unknown cause but  could be pyelo or sepsis, Fetal tachycardia Headache Maternal tachycardia Negative Covid test - no respiratory symptoms Consult with Dr. Adrian Blackwater and reviewed symptoms and labs Admit to Antenatal - Dr. Adrian Blackwater to place orders Will switch IV fluids to normal saline for Rocephin infusion. Urine culture pending. Gc/Chlam pending.  Nolene Bernheim, RN, MSN, NP-BC Nurse Practitioner, [facility] for [employer] [facility], [facility] 01/04/2020 9:01 PM  Will cover for UTI, given patient's prior history of UTI.  Urine culture pending Lactic acid normal. Repeat CBC in AM.  Levie Heritage, DO

## 2020-01-05 NOTE — Progress Notes (Addendum)
GC + from yesterday. Results given to patient. Discussed with pharmacy--IV Rocephin is adequate for treatment. Advised to notify partner, abstain from intercourse.

## 2020-01-05 NOTE — Progress Notes (Addendum)
Patient ID: Jacqueline [name], female   DOB: May 25, 2001, 19 y.o.   MRN: [id number] FACULTY PRACTICE ANTEPARTUM(COMPREHENSIVE) NOTE  Jacqueline [name] is a 19 y.o. G2P0102 at [redacted]w[redacted]d by early ultrasound who is admitted for fever, presumed pyelonephritis.   Fetal presentation is unsure. Length of Stay:  1  Days  ASSESSMENT: Active Problems:   Anemia affecting pregnancy in second trimester   UTI (urinary tract infection)   PLAN: Continue IV Rocephin Tylenol plus ibuprofen (for 48 hours) for fever control Await urine culture results Begin every other day iron, per pharmacy this improves absorption with every other day dosing.  Subjective: Feels like her fever is breaking, she has sweating.  Feels well right now. Patient reports the fetal movement as active. Patient reports uterine contraction  activity as none. Patient reports  vaginal bleeding as none. Patient describes fluid per vagina as None.  Vitals:  Blood pressure (!) 94/39, pulse (!) 105, temperature 97.6 F (36.4 C), temperature source Oral, resp. rate 18, height 5\' 3"  (1.6 m), weight 69.7 kg, last menstrual period 07/07/2019, SpO2 100 %, currently breastfeeding. Physical Examination:  General appearance - alert, well appearing, and in no distress Chest - normal effort Abdomen - gravid, non-tender Fundal Height:  size equals dates Extremities: Homans sign is negative, no sign of DVT  Membranes:intact  Fetal Monitoring:  Baseline: 155 bpm, Variability: Good {> 6 bpm), Accelerations: Reactive and Decelerations: Absent  Labs:  Results for orders placed or performed during the hospital encounter of 01/04/20 (from the past 24 hour(s))  Urinalysis, Routine w reflex microscopic   Collection Time: 01/04/20  6:24 PM  Result Value Ref Range   Color, Urine AMBER (A) YELLOW   APPearance HAZY (A) CLEAR   Specific Gravity, Urine 1.024 1.005 - 1.030   pH 7.0 5.0 - 8.0   Glucose, UA NEGATIVE NEGATIVE mg/dL   Hgb urine dipstick NEGATIVE  NEGATIVE   Bilirubin Urine NEGATIVE NEGATIVE   Ketones, ur 80 (A) NEGATIVE mg/dL   Protein, ur 30 (A) NEGATIVE mg/dL   Nitrite NEGATIVE NEGATIVE   Leukocytes,Ua MODERATE (A) NEGATIVE   RBC / HPF 0-5 0 - 5 RBC/hpf   WBC, UA 11-20 0 - 5 WBC/hpf   Bacteria, UA MANY (A) NONE SEEN   Squamous Epithelial / LPF 11-20 0 - 5   Mucus PRESENT   SARS Coronavirus 2 by RT PCR (hospital order, performed in [facility] hospital lab) Nasopharyngeal Nasopharyngeal Swab   Collection Time: 01/04/20  6:55 PM   Specimen: Nasopharyngeal Swab  Result Value Ref Range   SARS Coronavirus 2 NEGATIVE NEGATIVE  Wet prep, genital   Collection Time: 01/04/20  7:10 PM  Result Value Ref Range   Yeast Wet Prep HPF POC NONE SEEN NONE SEEN   Trich, Wet Prep NONE SEEN NONE SEEN   Clue Cells Wet Prep HPF POC NONE SEEN NONE SEEN   WBC, Wet Prep HPF POC MANY (A) NONE SEEN   Sperm NONE SEEN   CBC   Collection Time: 01/04/20  7:37 PM  Result Value Ref Range   WBC 14.3 (H) 4.0 - 10.5 K/uL   RBC 3.62 (L) 3.87 - 5.11 MIL/uL   Hemoglobin 10.3 (L) 12.0 - 15.0 g/dL   HCT 03/05/20 (L) 36 - 46 %   MCV 86.5 80.0 - 100.0 fL   MCH 28.5 26.0 - 34.0 pg   MCHC 32.9 30.0 - 36.0 g/dL   RDW 42.5 95.6 - 38.7 %   Platelets 283 150 - 400 K/uL  nRBC 0.0 0.0 - 0.2 %  Comprehensive metabolic panel   Collection Time: 01/04/20  7:37 PM  Result Value Ref Range   Sodium 130 (L) 135 - 145 mmol/L   Potassium 3.2 (L) 3.5 - 5.1 mmol/L   Chloride 99 98 - 111 mmol/L   CO2 19 (L) 22 - 32 mmol/L   Glucose, Bld 82 70 - 99 mg/dL   BUN <5 (L) 6 - 20 mg/dL   Creatinine, Ser 7.32 0.44 - 1.00 mg/dL   Calcium 8.6 (L) 8.9 - 10.3 mg/dL   Total Protein 6.4 (L) 6.5 - 8.1 g/dL   Albumin 2.8 (L) 3.5 - 5.0 g/dL   AST 12 (L) 15 - 41 U/L   ALT 10 0 - 44 U/L   Alkaline Phosphatase 45 38 - 126 U/L   Total Bilirubin 1.0 0.3 - 1.2 mg/dL   GFR calc non Af Amer >60 >60 mL/min   GFR calc Af Amer >60 >60 mL/min   Anion gap 12 5 - 15  Differential   Collection  Time: 01/04/20  7:37 PM  Result Value Ref Range   Neutrophils Relative % 84 %   Neutro Abs 12.0 (H) 1.7 - 7.7 K/uL   Lymphocytes Relative 7 %   Lymphs Abs 1.1 0.7 - 4.0 K/uL   Monocytes Relative 8 %   Monocytes Absolute 1.1 (H) 0 - 1 K/uL   Eosinophils Relative 0 %   Eosinophils Absolute 0.0 0 - 0 K/uL   Basophils Relative 0 %   Basophils Absolute 0.0 0 - 0 K/uL   Immature Granulocytes 1 %   Abs Immature Granulocytes 0.08 (H) 0.00 - 0.07 K/uL  Type and screen [facility]   Collection Time: 01/04/20  7:37 PM  Result Value Ref Range   ABO/RH(D) A POS    Antibody Screen NEG    Sample Expiration      01/07/2020,2359 Performed at [facility], [address], [place], [place] [address]   Culture, blood (routine x 2)   Collection Time: 01/04/20  9:17 PM   Specimen: BLOOD  Result Value Ref Range   Specimen Description BLOOD LEFT ANTECUBITAL    Special Requests      BOTTLES DRAWN AEROBIC AND ANAEROBIC Blood Culture adequate volume   Culture      NO GROWTH < 12 HOURS Performed at [facility], [address], [place], [place] [address]    Report Status PENDING   Culture, blood (routine x 2)   Collection Time: 01/04/20  9:17 PM   Specimen: BLOOD  Result Value Ref Range   Specimen Description BLOOD LEFT ANTECUBITAL    Special Requests      BOTTLES DRAWN AEROBIC AND ANAEROBIC Blood Culture adequate volume   Culture      NO GROWTH < 12 HOURS Performed at [facility], [address] [place], [place] [address]    Report Status PENDING   Lactic acid, plasma   Collection Time: 01/04/20  9:17 PM  Result Value Ref Range   Lactic Acid, Venous 0.9 0.5 - 1.9 mmol/L  Lactic acid, plasma   Collection Time: 01/05/20 12:49 AM  Result Value Ref Range   Lactic Acid, Venous 1.1 0.5 - 1.9 mmol/L  CBC   Collection Time: 01/05/20  7:22 AM  Result Value Ref Range   WBC 13.6 (H) 4.0 - 10.5 K/uL   RBC 3.14 (L) 3.87 - 5.11 MIL/uL   Hemoglobin 9.2 (L) 12.0  -  15.0 g/dL   HCT 92.1 (L) 36 - 46 %   MCV 86.3 80.0 - 100.0 fL   MCH 29.3 26.0 - 34.0 pg   MCHC 33.9 30.0 - 36.0 g/dL   RDW 19.4 17.4 - 08.1 %   Platelets 253 150 - 400 K/uL   nRBC 0.0 0.0 - 0.2 %    Medications:  Scheduled . aspirin EC  81 mg Oral Daily  . docusate sodium  100 mg Oral Daily  . enoxaparin (LOVENOX) injection  40 mg Subcutaneous QHS  . ferrous sulfate  325 mg Oral TID WC  . prenatal multivitamin  1 tablet Oral Q1200   I have reviewed the patient's current medications.   Reva Bores, MD 01/05/2020,9:54 AM

## 2020-01-06 LAB — CULTURE, OB URINE: Culture: >=100000 — AB

## 2020-01-06 MED ORDER — FAMOTIDINE 20 MG PO TABS
20.0000 mg | ORAL_TABLET | Freq: Every day | ORAL | Status: DC
  Administered 2020-01-06 – 2020-01-07 (×2): 20 mg via ORAL
  Filled 2020-01-06 (×3): qty 1

## 2020-01-06 MED ORDER — BUTALBITAL-APAP-CAFFEINE 50-325-40 MG PO TABS
1.0000 | ORAL_TABLET | Freq: Four times a day (QID) | ORAL | Status: DC | PRN
  Administered 2020-01-06 (×2): 1 via ORAL
  Filled 2020-01-06 (×2): qty 1

## 2020-01-06 NOTE — Progress Notes (Signed)
Patient ID: [place] Lohr, female   DOB: Nov 09, 2000, 19 y.o.   MRN: [id number] FACULTY PRACTICE ANTEPARTUM(COMPREHENSIVE) NOTE  [place] Troop is a 19 y.o. P2R5188 with Estimated Date of Delivery: 04/30/20   By   [redacted]w[redacted]d  who is admitted for presumed pyelonephritis.    Fetal presentation is unsure. Length of Stay:  2  Days  Date of admission:01/04/2020  Subjective: Feels better, still has headaches throughout the day Patient reports the fetal movement as active. Patient reports uterine contraction  activity as none. Patient reports  vaginal bleeding as none. Patient describes fluid per vagina as None.  Vitals:  Blood pressure (!) 103/53, pulse (!) 116, temperature 99 F (37.2 C), temperature source Oral, resp. rate 18, height 5\' 3"  (1.6 m), weight 69.7 kg, last menstrual period 07/07/2019, SpO2 100 %, currently breastfeeding. Vitals:   01/05/20 1841 01/05/20 2023 01/05/20 2203 01/06/20 0556  BP:  (!) 99/41 (!) 102/51 (!) 103/53  Pulse:  81 92 (!) 116  Resp:  18 18 18   Temp: 98 F (36.7 C) 97.6 F (36.4 C) 97.7 F (36.5 C) 99 F (37.2 C)  TempSrc: Oral Oral Oral Oral  SpO2:  100%  100%  Weight:      Height:       Physical Examination:  General appearance - alert, well appearing, and in no distress Back exam - mild Right CVAT Fundal Height:  size equals dates Pelvic Exam:  examination not indicated Cervical Exam: Not evaluated. Extremities: extremities normal, atraumatic, no cyanosis or edema with DTRs 2+ bilaterally Membranes:intact  Fetal Monitoring:  Dopller 150s     Labs:  Results for orders placed or performed during the hospital encounter of 01/04/20 (from the past 24 hour(s))  CBC   Collection Time: 01/05/20  7:22 AM  Result Value Ref Range   WBC 13.6 (H) 4.0 - 10.5 K/uL   RBC 3.14 (L) 3.87 - 5.11 MIL/uL   Hemoglobin 9.2 (L) 12.0 - 15.0 g/dL   HCT 41.6 (L) 36 - 46 %   MCV 86.3 80.0 - 100.0 fL   MCH 29.3 26.0 - 34.0 pg   MCHC 33.9 30.0 - 36.0 g/dL   RDW 60.6  30.1 - 60.1 %   Platelets 253 150 - 400 K/uL   nRBC 0.0 0.0 - 0.2 %    Imaging Studies:    [place] MFM OB FOLLOW UP  Result Date: 01/05/2020 ----------------------------------------------------------------------  OBSTETRICS REPORT                       (Signed Final 01/05/2020 01:55 pm) ---------------------------------------------------------------------- Patient Info  ID #:       [id number]                          D.O.B.:  [birth year] (19 yrs)  Name:       [place] Brinkmeier                Visit Date: 01/05/2020 11:25 am ---------------------------------------------------------------------- Performed By  Attending:        Ma Rings MD         Secondary Phy.:   Judeth Horn  CNM  Performed By:     Percell Boston          Address:          [address]                    [address]                                                             [address]  Referred By:      [facility]             [facility]  Ref. Address:     [address]                    [address]                    [place]                    [address] ---------------------------------------------------------------------- Orders  #  Description                           Code        Ordered By  1  [place] MFM OB FOLLOW UP                   [phone]    Tinnie Gens ----------------------------------------------------------------------  #  Order #                     Accession #                Episode #  1  [id number]                   [id number]                 [id number] ---------------------------------------------------------------------- Indications  Tobacco use complicating pregnancy,            O99.332  second trimester  [redacted] weeks gestation of pregnancy                Z3A.23  Poor obstetric history: Previous               O09.299  preeclampsia / eclampsia/gestational HTN  Poor  obstetric history: Previous preterm       O09.219  delivery, antepartum (Twins at 81 weeks)  Short interval between pregancies, 2nd         O09.892  trimester (Delivered 10-27-2018)  LOW Risk NIPS     Negative Horizon  Encounter for other antenatal screening        Z36.2  follow-up ---------------------------------------------------------------------- Fetal Evaluation  Num Of  Fetuses:         1  Fetal Heart Rate(bpm):  126  Cardiac Activity:       Observed  Presentation:           Cephalic  Placenta:               Anterior  P. Cord Insertion:      Visualized, central  Amniotic Fluid  AFI FV:      Within normal limits                              Largest Pocket(cm)                              7.2 ---------------------------------------------------------------------- Biometry  BPD:      61.6  mm     G. Age:  25w 0d         92  %    CI:        76.16   %    70 - 86                                                          FL/HC:      17.6   %    19.2 - 20.8  HC:      223.7  mm     G. Age:  24w 3d         72  %    HC/AC:      1.16        1.05 - 1.21  AC:      192.6  mm     G. Age:  24w 0d         59  %    FL/BPD:     64.0   %    71 - 87  FL:       39.4  mm     G. Age:  22w 5d         17  %    FL/AC:      20.5   %    20 - 24  Est. FW:     609  gm      1 lb 5 oz     50  % ---------------------------------------------------------------------- Gestational Age  U/S Today:     24w 0d                                        EDD:   04/26/20  Best:          23w 3d     Det. By:  Previous Ultrasound      EDD:   04/30/20                                      (09/06/19) ---------------------------------------------------------------------- Anatomy  Cranium:               Appears normal         Aortic Arch:  Previously seen  Cavum:                 Previously seen        Ductal Arch:            Previously seen  Ventricles:            Appears normal         Diaphragm:              Appears normal  Choroid Plexus:        Previously  seen        Stomach:                Appears normal, left                                                                        sided  Cerebellum:            Previously seen        Abdomen:                Appears normal  Posterior Fossa:       Previously seen        Abdominal Wall:         Appears nml (cord                                                                        insert, abd wall)  Nuchal Fold:           Previously seen        Cord Vessels:           Previously seen  Face:                  Orbits and profile     Kidneys:                Appear normal                         previously seen  Lips:                  Previously seen        Bladder:                Appears normal  Thoracic:              Appears normal         Spine:                  Previously seen  Heart:                 Previously seen        Upper Extremities:      Previously seen  RVOT:                  Appears normal  Lower Extremities:      Previously seen  LVOT:                  Appears normal  Other:  Fetus appears to be a female. Heels visualized previously. Nasal bone          visualized previously. Open hands visualized previously. ---------------------------------------------------------------------- Cervix Uterus Adnexa  Cervix  Length:            3.5  cm.  Normal appearance by transabdominal scan. ---------------------------------------------------------------------- Comments  This patient was seen for a follow up growth scan.  The fetal growth and amniotic fluid level appears appropriate  for her gestational age.  Follow-up as indicated. ----------------------------------------------------------------------                   Ma Rings, MD Electronically Signed Final Report   01/05/2020 01:55 pm ----------------------------------------------------------------------    Medications:  Scheduled . aspirin EC  81 mg Oral Daily  . docusate sodium  100 mg Oral Daily  . enoxaparin (LOVENOX) injection  40 mg Subcutaneous QHS   . famotidine  20 mg Oral Daily  . ferrous sulfate  325 mg Oral QODAY  . prenatal multivitamin  1 tablet Oral Q1200   I have reviewed the patient's current medications.  ASSESSMENT: [id number] [redacted]w[redacted]d Estimated Date of Delivery: 04/30/20  Presumed pyelonephritis:GNR in urine culture +GC   Patient Active Problem List   Diagnosis Date Noted  . UTI (urinary tract infection) 01/04/2020  . Nausea and vomiting during pregnancy prior to [redacted] weeks gestation 11/10/2019  . GBS bacteriuria 10/11/2019  . Hx of preeclampsia, prior pregnancy, currently pregnant 10/06/2019  . Hx of postpartum hemorrhage, currently pregnant 10/06/2019  . Short interval between pregnancies complicating pregnancy, antepartum 10/06/2019  . Chlamydia trachomatis infection during pregnancy in first trimester 10/06/2019  . Current nicotine use 10/06/2019  . Supervision of other normal pregnancy, antepartum 09/29/2019  . Anemia affecting pregnancy in second trimester 10/26/2018    PLAN: >Await ID and sensitivities >Continue Rocehin for at least 24 more hours(gets daily dose at 2100) >fioricet for headache  Amaryllis Dyke Mailey Landstrom 01/06/2020,7:14 AM

## 2020-01-07 DIAGNOSIS — B964 Proteus (mirabilis) (morganii) as the cause of diseases classified elsewhere: Secondary | ICD-10-CM

## 2020-01-07 DIAGNOSIS — O98212 Gonorrhea complicating pregnancy, second trimester: Secondary | ICD-10-CM

## 2020-01-07 DIAGNOSIS — Z1639 Resistance to other specified antimicrobial drug: Secondary | ICD-10-CM

## 2020-01-07 DIAGNOSIS — A549 Gonococcal infection, unspecified: Secondary | ICD-10-CM

## 2020-01-07 MED ORDER — CEFADROXIL 500 MG PO CAPS: 500.0000 mg | ORAL_CAPSULE | Freq: Two times a day (BID) | ORAL | 0 refills | Status: AC

## 2020-01-07 MED ORDER — CEPHALEXIN 500 MG PO CAPS
500.0000 mg | ORAL_CAPSULE | Freq: Two times a day (BID) | ORAL | 2 refills | Status: DC
Start: 2020-01-07 — End: 2020-02-13

## 2020-01-07 NOTE — Progress Notes (Signed)
Discharge instructions and prescriptions given to pt. Discussed signs and symptoms to report to the MD, upcoming appointments, and meds. Pt verbalizes understanding and has no questions or concerns at this time. Pt discharged from hospital in stable condition. 

## 2020-01-07 NOTE — Discharge Summary (Signed)
Appears normal  Thoracic:              Appears normal         Spine:                  Appears normal  Heart:                 Appears normal         Upper Extremities:      Appears normal                         (4CH, axis, and                         situs)  RVOT:                  Appears normal         Lower Extremities:      Appears normal  LVOT:                  Not well visualized  Other:  Fetus appears to be a female. Heels visualized. Nasal bone visualized.          Open hands visualized. ---------------------------------------------------------------------- Cervix Uterus Adnexa  Cervix  Length:           3.06  cm.  Normal appearance by transabdominal scan.  Right Ovary  Within normal limits.  Left Ovary  Within normal limits.  Adnexa  No abnormality visualized.  ---------------------------------------------------------------------- Comments  This patient was seen for a detailed fetal anatomy scan.  She denies any significant past medical history and denies  any problems in her current pregnancy.  She had a cell free DNA test earlier in her pregnancy which  indicated a low risk for trisomy 85, 84, and 13. A female fetus is  predicted.  She was informed that the fetal growth and amniotic fluid  level were appropriate for her gestational age.  There were no obvious fetal anomalies noted on today's  ultrasound exam.  However, the views of the fetal anatomy  were limited today due to the fetal position.  The patient was informed that anomalies may be missed due  to technical limitations. If the fetus is in a suboptimal position  or maternal habitus is increased, visualization of the fetus in  the maternal uterus may be impaired.  A follow-up exam was scheduled in 4 weeks to complete the  views of the fetal anatomy. ----------------------------------------------------------------------                   Ma Rings, MD Electronically Signed Final Report   12/08/2019 11:51 am ----------------------------------------------------------------------  [place] [facility] FOLLOW UP  Result Date: 01/05/2020 ----------------------------------------------------------------------  OBSTETRICS REPORT                       (Signed Final 01/05/2020 01:55 pm) ---------------------------------------------------------------------- Patient Info  ID #:       [id number]                          D.O.B.:  04-19-2001 (19 yrs)  Name:       Jacqueline [name]                Visit Date: 01/05/2020 11:25 am ---------------------------------------------------------------------- Performed By  Attending:        Ma Rings MD  Antenatal Physician Discharge Summary  Patient ID: Jacqueline [name] MRN: [id number] DOB/AGE: [birth year] 19 y.o.  Admit date: 01/04/2020 Discharge date: 01/07/2020  Admission Diagnoses:  Discharge Diagnoses:   Prenatal Procedures: ultrasound  Consults: Neonatology, Maternal Fetal Medicine  Hospital Course:  Jacqueline [name] is a 19 y.o. [id number] with IUP at [redacted]w[redacted]d admitted for pyelonephritis.  She reported fever and chills at home.  Initial source was not exactly clear.  She had negative blood cultures x2.  She was started on Rocephin.  Urine culture grew Proteus mirabilis resistant to Macrobid.  She did test positive for gonorrhea, following discussion with ID pharmacy it was felt that her IV Rocephin would cover this infection.  She was instructed to have her partner treated and to abstain from sexual intercourse for 1 week.  She last had a temperature 48 hours prior to discharge.  She was feeling better.  She was deemed stable for discharge to home with outpatient follow up.  Discharge Exam: Temp:  [97.5 F (36.4 C)-99.4 F (37.4 C)] 98 F (36.7 C) (08/06 0852) Pulse Rate:  [75-102] 76 (08/06 0852) Resp:  [17-18] 18 (08/06 0852) BP: (90-111)/(43-63) 108/55 (08/06 0852) SpO2:  [97 %-100 %] 97 % (08/06 0852) Physical Examination: CONSTITUTIONAL: Well-developed, well-nourished female in no acute distress.  HENT:  Normocephalic, atraumatic, External right and left ear normal. Oropharynx is clear and moist EYES: Conjunctivae and EOM are normal.  No scleral icterus.  NECK: Normal range of motion, supple, no masses SKIN: Skin is warm and dry. No rash noted. Not diaphoretic. No erythema. No pallor. NEUROLGIC: Alert and oriented to person, place, and time. Normal coordination. No cranial nerve deficit noted. PSYCHIATRIC: Normal mood and affect. Normal behavior. Normal judgment and thought content. CARDIOVASCULAR: Normal heart rate noted, regular rhythm RESPIRATORY: Effort and breath sounds  normal, no problems with respiration noted MUSCULOSKELETAL: Normal range of motion. No edema and no tenderness. 2+ distal pulses.  No CVA tenderness ABDOMEN: Soft, nontender, nondistended, gravid. CERVIX:    Fetal monitoring: FHR: 145 bpm, Variability: moderate, Accelerations: Not present, appropriate for gestational age, Decelerations: Absent  Uterine activity: No contractions per hour  Significant Diagnostic Studies:  Results for orders placed or performed during the hospital encounter of 01/04/20 (from the past 168 hour(s))  Urinalysis, Routine w reflex microscopic   Collection Time: 01/04/20  6:24 PM  Result Value Ref Range   Color, Urine AMBER (A) YELLOW   APPearance HAZY (A) CLEAR   Specific Gravity, Urine 1.024 1.005 - 1.030   pH 7.0 5.0 - 8.0   Glucose, UA NEGATIVE NEGATIVE mg/dL   Hgb urine dipstick NEGATIVE NEGATIVE   Bilirubin Urine NEGATIVE NEGATIVE   Ketones, ur 80 (A) NEGATIVE mg/dL   Protein, ur 30 (A) NEGATIVE mg/dL   Nitrite NEGATIVE NEGATIVE   Leukocytes,Ua MODERATE (A) NEGATIVE   RBC / HPF 0-5 0 - 5 RBC/hpf   WBC, UA 11-20 0 - 5 WBC/hpf   Bacteria, UA MANY (A) NONE SEEN   Squamous Epithelial / LPF 11-20 0 - 5   Mucus PRESENT   SARS Coronavirus 2 by RT PCR (hospital order, performed in [facility] hospital lab) Nasopharyngeal Nasopharyngeal Swab   Collection Time: 01/04/20  6:55 PM   Specimen: Nasopharyngeal Swab  Result Value Ref Range   SARS Coronavirus 2 NEGATIVE NEGATIVE  Wet prep, genital   Collection Time: 01/04/20  7:10 PM  Result Value Ref Range   Yeast Wet Prep HPF POC NONE SEEN NONE SEEN   Trich, Wet Prep  Antenatal Physician Discharge Summary  Patient ID: Jacqueline [name] MRN: [id number] DOB/AGE: [birth year] 19 y.o.  Admit date: 01/04/2020 Discharge date: 01/07/2020  Admission Diagnoses:  Discharge Diagnoses:   Prenatal Procedures: ultrasound  Consults: Neonatology, Maternal Fetal Medicine  Hospital Course:  Jacqueline [name] is a 19 y.o. [id number] with IUP at [redacted]w[redacted]d admitted for pyelonephritis.  She reported fever and chills at home.  Initial source was not exactly clear.  She had negative blood cultures x2.  She was started on Rocephin.  Urine culture grew Proteus mirabilis resistant to Macrobid.  She did test positive for gonorrhea, following discussion with ID pharmacy it was felt that her IV Rocephin would cover this infection.  She was instructed to have her partner treated and to abstain from sexual intercourse for 1 week.  She last had a temperature 48 hours prior to discharge.  She was feeling better.  She was deemed stable for discharge to home with outpatient follow up.  Discharge Exam: Temp:  [97.5 F (36.4 C)-99.4 F (37.4 C)] 98 F (36.7 C) (08/06 0852) Pulse Rate:  [75-102] 76 (08/06 0852) Resp:  [17-18] 18 (08/06 0852) BP: (90-111)/(43-63) 108/55 (08/06 0852) SpO2:  [97 %-100 %] 97 % (08/06 0852) Physical Examination: CONSTITUTIONAL: Well-developed, well-nourished female in no acute distress.  HENT:  Normocephalic, atraumatic, External right and left ear normal. Oropharynx is clear and moist EYES: Conjunctivae and EOM are normal.  No scleral icterus.  NECK: Normal range of motion, supple, no masses SKIN: Skin is warm and dry. No rash noted. Not diaphoretic. No erythema. No pallor. NEUROLGIC: Alert and oriented to person, place, and time. Normal coordination. No cranial nerve deficit noted. PSYCHIATRIC: Normal mood and affect. Normal behavior. Normal judgment and thought content. CARDIOVASCULAR: Normal heart rate noted, regular rhythm RESPIRATORY: Effort and breath sounds  normal, no problems with respiration noted MUSCULOSKELETAL: Normal range of motion. No edema and no tenderness. 2+ distal pulses.  No CVA tenderness ABDOMEN: Soft, nontender, nondistended, gravid. CERVIX:    Fetal monitoring: FHR: 145 bpm, Variability: moderate, Accelerations: Not present, appropriate for gestational age, Decelerations: Absent  Uterine activity: No contractions per hour  Significant Diagnostic Studies:  Results for orders placed or performed during the hospital encounter of 01/04/20 (from the past 168 hour(s))  Urinalysis, Routine w reflex microscopic   Collection Time: 01/04/20  6:24 PM  Result Value Ref Range   Color, Urine AMBER (A) YELLOW   APPearance HAZY (A) CLEAR   Specific Gravity, Urine 1.024 1.005 - 1.030   pH 7.0 5.0 - 8.0   Glucose, UA NEGATIVE NEGATIVE mg/dL   Hgb urine dipstick NEGATIVE NEGATIVE   Bilirubin Urine NEGATIVE NEGATIVE   Ketones, ur 80 (A) NEGATIVE mg/dL   Protein, ur 30 (A) NEGATIVE mg/dL   Nitrite NEGATIVE NEGATIVE   Leukocytes,Ua MODERATE (A) NEGATIVE   RBC / HPF 0-5 0 - 5 RBC/hpf   WBC, UA 11-20 0 - 5 WBC/hpf   Bacteria, UA MANY (A) NONE SEEN   Squamous Epithelial / LPF 11-20 0 - 5   Mucus PRESENT   SARS Coronavirus 2 by RT PCR (hospital order, performed in [facility] hospital lab) Nasopharyngeal Nasopharyngeal Swab   Collection Time: 01/04/20  6:55 PM   Specimen: Nasopharyngeal Swab  Result Value Ref Range   SARS Coronavirus 2 NEGATIVE NEGATIVE  Wet prep, genital   Collection Time: 01/04/20  7:10 PM  Result Value Ref Range   Yeast Wet Prep HPF POC NONE SEEN NONE SEEN   Trich, Wet Prep  Antenatal Physician Discharge Summary  Patient ID: Jacqueline [name] MRN: [id number] DOB/AGE: [birth year] 19 y.o.  Admit date: 01/04/2020 Discharge date: 01/07/2020  Admission Diagnoses:  Discharge Diagnoses:   Prenatal Procedures: ultrasound  Consults: Neonatology, Maternal Fetal Medicine  Hospital Course:  Jacqueline [name] is a 19 y.o. [id number] with IUP at [redacted]w[redacted]d admitted for pyelonephritis.  She reported fever and chills at home.  Initial source was not exactly clear.  She had negative blood cultures x2.  She was started on Rocephin.  Urine culture grew Proteus mirabilis resistant to Macrobid.  She did test positive for gonorrhea, following discussion with ID pharmacy it was felt that her IV Rocephin would cover this infection.  She was instructed to have her partner treated and to abstain from sexual intercourse for 1 week.  She last had a temperature 48 hours prior to discharge.  She was feeling better.  She was deemed stable for discharge to home with outpatient follow up.  Discharge Exam: Temp:  [97.5 F (36.4 C)-99.4 F (37.4 C)] 98 F (36.7 C) (08/06 0852) Pulse Rate:  [75-102] 76 (08/06 0852) Resp:  [17-18] 18 (08/06 0852) BP: (90-111)/(43-63) 108/55 (08/06 0852) SpO2:  [97 %-100 %] 97 % (08/06 0852) Physical Examination: CONSTITUTIONAL: Well-developed, well-nourished female in no acute distress.  HENT:  Normocephalic, atraumatic, External right and left ear normal. Oropharynx is clear and moist EYES: Conjunctivae and EOM are normal.  No scleral icterus.  NECK: Normal range of motion, supple, no masses SKIN: Skin is warm and dry. No rash noted. Not diaphoretic. No erythema. No pallor. NEUROLGIC: Alert and oriented to person, place, and time. Normal coordination. No cranial nerve deficit noted. PSYCHIATRIC: Normal mood and affect. Normal behavior. Normal judgment and thought content. CARDIOVASCULAR: Normal heart rate noted, regular rhythm RESPIRATORY: Effort and breath sounds  normal, no problems with respiration noted MUSCULOSKELETAL: Normal range of motion. No edema and no tenderness. 2+ distal pulses.  No CVA tenderness ABDOMEN: Soft, nontender, nondistended, gravid. CERVIX:    Fetal monitoring: FHR: 145 bpm, Variability: moderate, Accelerations: Not present, appropriate for gestational age, Decelerations: Absent  Uterine activity: No contractions per hour  Significant Diagnostic Studies:  Results for orders placed or performed during the hospital encounter of 01/04/20 (from the past 168 hour(s))  Urinalysis, Routine w reflex microscopic   Collection Time: 01/04/20  6:24 PM  Result Value Ref Range   Color, Urine AMBER (A) YELLOW   APPearance HAZY (A) CLEAR   Specific Gravity, Urine 1.024 1.005 - 1.030   pH 7.0 5.0 - 8.0   Glucose, UA NEGATIVE NEGATIVE mg/dL   Hgb urine dipstick NEGATIVE NEGATIVE   Bilirubin Urine NEGATIVE NEGATIVE   Ketones, ur 80 (A) NEGATIVE mg/dL   Protein, ur 30 (A) NEGATIVE mg/dL   Nitrite NEGATIVE NEGATIVE   Leukocytes,Ua MODERATE (A) NEGATIVE   RBC / HPF 0-5 0 - 5 RBC/hpf   WBC, UA 11-20 0 - 5 WBC/hpf   Bacteria, UA MANY (A) NONE SEEN   Squamous Epithelial / LPF 11-20 0 - 5   Mucus PRESENT   SARS Coronavirus 2 by RT PCR (hospital order, performed in [facility] hospital lab) Nasopharyngeal Nasopharyngeal Swab   Collection Time: 01/04/20  6:55 PM   Specimen: Nasopharyngeal Swab  Result Value Ref Range   SARS Coronavirus 2 NEGATIVE NEGATIVE  Wet prep, genital   Collection Time: 01/04/20  7:10 PM  Result Value Ref Range   Yeast Wet Prep HPF POC NONE SEEN NONE SEEN   Trich, Wet Prep  Appears normal  Thoracic:              Appears normal         Spine:                  Appears normal  Heart:                 Appears normal         Upper Extremities:      Appears normal                         (4CH, axis, and                         situs)  RVOT:                  Appears normal         Lower Extremities:      Appears normal  LVOT:                  Not well visualized  Other:  Fetus appears to be a female. Heels visualized. Nasal bone visualized.          Open hands visualized. ---------------------------------------------------------------------- Cervix Uterus Adnexa  Cervix  Length:           3.06  cm.  Normal appearance by transabdominal scan.  Right Ovary  Within normal limits.  Left Ovary  Within normal limits.  Adnexa  No abnormality visualized.  ---------------------------------------------------------------------- Comments  This patient was seen for a detailed fetal anatomy scan.  She denies any significant past medical history and denies  any problems in her current pregnancy.  She had a cell free DNA test earlier in her pregnancy which  indicated a low risk for trisomy 85, 84, and 13. A female fetus is  predicted.  She was informed that the fetal growth and amniotic fluid  level were appropriate for her gestational age.  There were no obvious fetal anomalies noted on today's  ultrasound exam.  However, the views of the fetal anatomy  were limited today due to the fetal position.  The patient was informed that anomalies may be missed due  to technical limitations. If the fetus is in a suboptimal position  or maternal habitus is increased, visualization of the fetus in  the maternal uterus may be impaired.  A follow-up exam was scheduled in 4 weeks to complete the  views of the fetal anatomy. ----------------------------------------------------------------------                   Ma Rings, MD Electronically Signed Final Report   12/08/2019 11:51 am ----------------------------------------------------------------------  [place] [facility] OB FOLLOW UP  Result Date: 01/05/2020 ----------------------------------------------------------------------  OBSTETRICS REPORT                       (Signed Final 01/05/2020 01:55 pm) ---------------------------------------------------------------------- Patient Info  ID #:       [id number]                          D.O.B.:  04-19-2001 (19 yrs)  Name:       Jacqueline [name]                Visit Date: 01/05/2020 11:25 am ---------------------------------------------------------------------- Performed By  Attending:        Ma Rings MD  Appears normal  Thoracic:              Appears normal         Spine:                  Appears normal  Heart:                 Appears normal         Upper Extremities:      Appears normal                         (4CH, axis, and                         situs)  RVOT:                  Appears normal         Lower Extremities:      Appears normal  LVOT:                  Not well visualized  Other:  Fetus appears to be a female. Heels visualized. Nasal bone visualized.          Open hands visualized. ---------------------------------------------------------------------- Cervix Uterus Adnexa  Cervix  Length:           3.06  cm.  Normal appearance by transabdominal scan.  Right Ovary  Within normal limits.  Left Ovary  Within normal limits.  Adnexa  No abnormality visualized.  ---------------------------------------------------------------------- Comments  This patient was seen for a detailed fetal anatomy scan.  She denies any significant past medical history and denies  any problems in her current pregnancy.  She had a cell free DNA test earlier in her pregnancy which  indicated a low risk for trisomy 85, 84, and 13. A female fetus is  predicted.  She was informed that the fetal growth and amniotic fluid  level were appropriate for her gestational age.  There were no obvious fetal anomalies noted on today's  ultrasound exam.  However, the views of the fetal anatomy  were limited today due to the fetal position.  The patient was informed that anomalies may be missed due  to technical limitations. If the fetus is in a suboptimal position  or maternal habitus is increased, visualization of the fetus in  the maternal uterus may be impaired.  A follow-up exam was scheduled in 4 weeks to complete the  views of the fetal anatomy. ----------------------------------------------------------------------                   Ma Rings, MD Electronically Signed Final Report   12/08/2019 11:51 am ----------------------------------------------------------------------  [place] [facility] OB FOLLOW UP  Result Date: 01/05/2020 ----------------------------------------------------------------------  OBSTETRICS REPORT                       (Signed Final 01/05/2020 01:55 pm) ---------------------------------------------------------------------- Patient Info  ID #:       [id number]                          D.O.B.:  04-19-2001 (19 yrs)  Name:       Jacqueline [name]                Visit Date: 01/05/2020 11:25 am ---------------------------------------------------------------------- Performed By  Attending:        Ma Rings MD  Antenatal Physician Discharge Summary  Patient ID: Jacqueline [name] MRN: [id number] DOB/AGE: [birth year] 19 y.o.  Admit date: 01/04/2020 Discharge date: 01/07/2020  Admission Diagnoses:  Discharge Diagnoses:   Prenatal Procedures: ultrasound  Consults: Neonatology, Maternal Fetal Medicine  Hospital Course:  Jacqueline [name] is a 19 y.o. [id number] with IUP at [redacted]w[redacted]d admitted for pyelonephritis.  She reported fever and chills at home.  Initial source was not exactly clear.  She had negative blood cultures x2.  She was started on Rocephin.  Urine culture grew Proteus mirabilis resistant to Macrobid.  She did test positive for gonorrhea, following discussion with ID pharmacy it was felt that her IV Rocephin would cover this infection.  She was instructed to have her partner treated and to abstain from sexual intercourse for 1 week.  She last had a temperature 48 hours prior to discharge.  She was feeling better.  She was deemed stable for discharge to home with outpatient follow up.  Discharge Exam: Temp:  [97.5 F (36.4 C)-99.4 F (37.4 C)] 98 F (36.7 C) (08/06 0852) Pulse Rate:  [75-102] 76 (08/06 0852) Resp:  [17-18] 18 (08/06 0852) BP: (90-111)/(43-63) 108/55 (08/06 0852) SpO2:  [97 %-100 %] 97 % (08/06 0852) Physical Examination: CONSTITUTIONAL: Well-developed, well-nourished female in no acute distress.  HENT:  Normocephalic, atraumatic, External right and left ear normal. Oropharynx is clear and moist EYES: Conjunctivae and EOM are normal.  No scleral icterus.  NECK: Normal range of motion, supple, no masses SKIN: Skin is warm and dry. No rash noted. Not diaphoretic. No erythema. No pallor. NEUROLGIC: Alert and oriented to person, place, and time. Normal coordination. No cranial nerve deficit noted. PSYCHIATRIC: Normal mood and affect. Normal behavior. Normal judgment and thought content. CARDIOVASCULAR: Normal heart rate noted, regular rhythm RESPIRATORY: Effort and breath sounds  normal, no problems with respiration noted MUSCULOSKELETAL: Normal range of motion. No edema and no tenderness. 2+ distal pulses.  No CVA tenderness ABDOMEN: Soft, nontender, nondistended, gravid. CERVIX:    Fetal monitoring: FHR: 145 bpm, Variability: moderate, Accelerations: Not present, appropriate for gestational age, Decelerations: Absent  Uterine activity: No contractions per hour  Significant Diagnostic Studies:  Results for orders placed or performed during the hospital encounter of 01/04/20 (from the past 168 hour(s))  Urinalysis, Routine w reflex microscopic   Collection Time: 01/04/20  6:24 PM  Result Value Ref Range   Color, Urine AMBER (A) YELLOW   APPearance HAZY (A) CLEAR   Specific Gravity, Urine 1.024 1.005 - 1.030   pH 7.0 5.0 - 8.0   Glucose, UA NEGATIVE NEGATIVE mg/dL   Hgb urine dipstick NEGATIVE NEGATIVE   Bilirubin Urine NEGATIVE NEGATIVE   Ketones, ur 80 (A) NEGATIVE mg/dL   Protein, ur 30 (A) NEGATIVE mg/dL   Nitrite NEGATIVE NEGATIVE   Leukocytes,Ua MODERATE (A) NEGATIVE   RBC / HPF 0-5 0 - 5 RBC/hpf   WBC, UA 11-20 0 - 5 WBC/hpf   Bacteria, UA MANY (A) NONE SEEN   Squamous Epithelial / LPF 11-20 0 - 5   Mucus PRESENT   SARS Coronavirus 2 by RT PCR (hospital order, performed in [facility] hospital lab) Nasopharyngeal Nasopharyngeal Swab   Collection Time: 01/04/20  6:55 PM   Specimen: Nasopharyngeal Swab  Result Value Ref Range   SARS Coronavirus 2 NEGATIVE NEGATIVE  Wet prep, genital   Collection Time: 01/04/20  7:10 PM  Result Value Ref Range   Yeast Wet Prep HPF POC NONE SEEN NONE SEEN   Trich, Wet Prep  Appears normal  Thoracic:              Appears normal         Spine:                  Appears normal  Heart:                 Appears normal         Upper Extremities:      Appears normal                         (4CH, axis, and                         situs)  RVOT:                  Appears normal         Lower Extremities:      Appears normal  LVOT:                  Not well visualized  Other:  Fetus appears to be a female. Heels visualized. Nasal bone visualized.          Open hands visualized. ---------------------------------------------------------------------- Cervix Uterus Adnexa  Cervix  Length:           3.06  cm.  Normal appearance by transabdominal scan.  Right Ovary  Within normal limits.  Left Ovary  Within normal limits.  Adnexa  No abnormality visualized.  ---------------------------------------------------------------------- Comments  This patient was seen for a detailed fetal anatomy scan.  She denies any significant past medical history and denies  any problems in her current pregnancy.  She had a cell free DNA test earlier in her pregnancy which  indicated a low risk for trisomy 85, 84, and 13. A female fetus is  predicted.  She was informed that the fetal growth and amniotic fluid  level were appropriate for her gestational age.  There were no obvious fetal anomalies noted on today's  ultrasound exam.  However, the views of the fetal anatomy  were limited today due to the fetal position.  The patient was informed that anomalies may be missed due  to technical limitations. If the fetus is in a suboptimal position  or maternal habitus is increased, visualization of the fetus in  the maternal uterus may be impaired.  A follow-up exam was scheduled in 4 weeks to complete the  views of the fetal anatomy. ----------------------------------------------------------------------                   Ma Rings, MD Electronically Signed Final Report   12/08/2019 11:51 am ----------------------------------------------------------------------  [place] [facility] OB FOLLOW UP  Result Date: 01/05/2020 ----------------------------------------------------------------------  OBSTETRICS REPORT                       (Signed Final 01/05/2020 01:55 pm) ---------------------------------------------------------------------- Patient Info  ID #:       [id number]                          D.O.B.:  04-19-2001 (19 yrs)  Name:       Jacqueline [name]                Visit Date: 01/05/2020 11:25 am ---------------------------------------------------------------------- Performed By  Attending:        Ma Rings MD  Appears normal  Thoracic:              Appears normal         Spine:                  Appears normal  Heart:                 Appears normal         Upper Extremities:      Appears normal                         (4CH, axis, and                         situs)  RVOT:                  Appears normal         Lower Extremities:      Appears normal  LVOT:                  Not well visualized  Other:  Fetus appears to be a female. Heels visualized. Nasal bone visualized.          Open hands visualized. ---------------------------------------------------------------------- Cervix Uterus Adnexa  Cervix  Length:           3.06  cm.  Normal appearance by transabdominal scan.  Right Ovary  Within normal limits.  Left Ovary  Within normal limits.  Adnexa  No abnormality visualized.  ---------------------------------------------------------------------- Comments  This patient was seen for a detailed fetal anatomy scan.  She denies any significant past medical history and denies  any problems in her current pregnancy.  She had a cell free DNA test earlier in her pregnancy which  indicated a low risk for trisomy 85, 84, and 13. A female fetus is  predicted.  She was informed that the fetal growth and amniotic fluid  level were appropriate for her gestational age.  There were no obvious fetal anomalies noted on today's  ultrasound exam.  However, the views of the fetal anatomy  were limited today due to the fetal position.  The patient was informed that anomalies may be missed due  to technical limitations. If the fetus is in a suboptimal position  or maternal habitus is increased, visualization of the fetus in  the maternal uterus may be impaired.  A follow-up exam was scheduled in 4 weeks to complete the  views of the fetal anatomy. ----------------------------------------------------------------------                   Ma Rings, MD Electronically Signed Final Report   12/08/2019 11:51 am ----------------------------------------------------------------------  [place] [facility] OB FOLLOW UP  Result Date: 01/05/2020 ----------------------------------------------------------------------  OBSTETRICS REPORT                       (Signed Final 01/05/2020 01:55 pm) ---------------------------------------------------------------------- Patient Info  ID #:       [id number]                          D.O.B.:  04-19-2001 (19 yrs)  Name:       Jacqueline [name]                Visit Date: 01/05/2020 11:25 am ---------------------------------------------------------------------- Performed By  Attending:        Ma Rings MD  Antenatal Physician Discharge Summary  Patient ID: Jacqueline [name] MRN: [id number] DOB/AGE: [birth year] 19 y.o.  Admit date: 01/04/2020 Discharge date: 01/07/2020  Admission Diagnoses:  Discharge Diagnoses:   Prenatal Procedures: ultrasound  Consults: Neonatology, Maternal Fetal Medicine  Hospital Course:  Jacqueline [name] is a 19 y.o. [id number] with IUP at [redacted]w[redacted]d admitted for pyelonephritis.  She reported fever and chills at home.  Initial source was not exactly clear.  She had negative blood cultures x2.  She was started on Rocephin.  Urine culture grew Proteus mirabilis resistant to Macrobid.  She did test positive for gonorrhea, following discussion with ID pharmacy it was felt that her IV Rocephin would cover this infection.  She was instructed to have her partner treated and to abstain from sexual intercourse for 1 week.  She last had a temperature 48 hours prior to discharge.  She was feeling better.  She was deemed stable for discharge to home with outpatient follow up.  Discharge Exam: Temp:  [97.5 F (36.4 C)-99.4 F (37.4 C)] 98 F (36.7 C) (08/06 0852) Pulse Rate:  [75-102] 76 (08/06 0852) Resp:  [17-18] 18 (08/06 0852) BP: (90-111)/(43-63) 108/55 (08/06 0852) SpO2:  [97 %-100 %] 97 % (08/06 0852) Physical Examination: CONSTITUTIONAL: Well-developed, well-nourished female in no acute distress.  HENT:  Normocephalic, atraumatic, External right and left ear normal. Oropharynx is clear and moist EYES: Conjunctivae and EOM are normal.  No scleral icterus.  NECK: Normal range of motion, supple, no masses SKIN: Skin is warm and dry. No rash noted. Not diaphoretic. No erythema. No pallor. NEUROLGIC: Alert and oriented to person, place, and time. Normal coordination. No cranial nerve deficit noted. PSYCHIATRIC: Normal mood and affect. Normal behavior. Normal judgment and thought content. CARDIOVASCULAR: Normal heart rate noted, regular rhythm RESPIRATORY: Effort and breath sounds  normal, no problems with respiration noted MUSCULOSKELETAL: Normal range of motion. No edema and no tenderness. 2+ distal pulses.  No CVA tenderness ABDOMEN: Soft, nontender, nondistended, gravid. CERVIX:    Fetal monitoring: FHR: 145 bpm, Variability: moderate, Accelerations: Not present, appropriate for gestational age, Decelerations: Absent  Uterine activity: No contractions per hour  Significant Diagnostic Studies:  Results for orders placed or performed during the hospital encounter of 01/04/20 (from the past 168 hour(s))  Urinalysis, Routine w reflex microscopic   Collection Time: 01/04/20  6:24 PM  Result Value Ref Range   Color, Urine AMBER (A) YELLOW   APPearance HAZY (A) CLEAR   Specific Gravity, Urine 1.024 1.005 - 1.030   pH 7.0 5.0 - 8.0   Glucose, UA NEGATIVE NEGATIVE mg/dL   Hgb urine dipstick NEGATIVE NEGATIVE   Bilirubin Urine NEGATIVE NEGATIVE   Ketones, ur 80 (A) NEGATIVE mg/dL   Protein, ur 30 (A) NEGATIVE mg/dL   Nitrite NEGATIVE NEGATIVE   Leukocytes,Ua MODERATE (A) NEGATIVE   RBC / HPF 0-5 0 - 5 RBC/hpf   WBC, UA 11-20 0 - 5 WBC/hpf   Bacteria, UA MANY (A) NONE SEEN   Squamous Epithelial / LPF 11-20 0 - 5   Mucus PRESENT   SARS Coronavirus 2 by RT PCR (hospital order, performed in [facility] hospital lab) Nasopharyngeal Nasopharyngeal Swab   Collection Time: 01/04/20  6:55 PM   Specimen: Nasopharyngeal Swab  Result Value Ref Range   SARS Coronavirus 2 NEGATIVE NEGATIVE  Wet prep, genital   Collection Time: 01/04/20  7:10 PM  Result Value Ref Range   Yeast Wet Prep HPF POC NONE SEEN NONE SEEN   Trich, Wet Prep  Antenatal Physician Discharge Summary  Patient ID: Jacqueline [name] MRN: [id number] DOB/AGE: [birth year] 19 y.o.  Admit date: 01/04/2020 Discharge date: 01/07/2020  Admission Diagnoses:  Discharge Diagnoses:   Prenatal Procedures: ultrasound  Consults: Neonatology, Maternal Fetal Medicine  Hospital Course:  Jacqueline [name] is a 19 y.o. E7M0947 with IUP at [redacted]w[redacted]d admitted for pyelonephritis.  She reported fever and chills at home.  Initial source was not exactly clear.  She had negative blood cultures x2.  She was started on Rocephin.  Urine culture grew Proteus mirabilis resistant to Macrobid.  She did test positive for gonorrhea, following discussion with ID pharmacy it was felt that her IV Rocephin would cover this infection.  She was instructed to have her partner treated and to abstain from sexual intercourse for 1 week.  She last had a temperature 48 hours prior to discharge.  She was feeling better.  She was deemed stable for discharge to home with outpatient follow up.  Discharge Exam: Temp:  [97.5 F (36.4 C)-99.4 F (37.4 C)] 98 F (36.7 C) (08/06 0852) Pulse Rate:  [75-102] 76 (08/06 0852) Resp:  [17-18] 18 (08/06 0852) BP: (90-111)/(43-63) 108/55 (08/06 0852) SpO2:  [97 %-100 %] 97 % (08/06 0852) Physical Examination: CONSTITUTIONAL: Well-developed, well-nourished female in no acute distress.  HENT:  Normocephalic, atraumatic, External right and left ear normal. Oropharynx is clear and moist EYES: Conjunctivae and EOM are normal.  No scleral icterus.  NECK: Normal range of motion, supple, no masses SKIN: Skin is warm and dry. No rash noted. Not diaphoretic. No erythema. No pallor. NEUROLGIC: Alert and oriented to person, place, and time. Normal coordination. No cranial nerve deficit noted. PSYCHIATRIC: Normal mood and affect. Normal behavior. Normal judgment and thought content. CARDIOVASCULAR: Normal heart rate noted, regular rhythm RESPIRATORY: Effort and breath sounds  normal, no problems with respiration noted MUSCULOSKELETAL: Normal range of motion. No edema and no tenderness. 2+ distal pulses.  No CVA tenderness ABDOMEN: Soft, nontender, nondistended, gravid. CERVIX:    Fetal monitoring: FHR: 145 bpm, Variability: moderate, Accelerations: Not present, appropriate for gestational age, Decelerations: Absent  Uterine activity: No contractions per hour  Significant Diagnostic Studies:  Results for orders placed or performed during the hospital encounter of 01/04/20 (from the past 168 hour(s))  Urinalysis, Routine w reflex microscopic   Collection Time: 01/04/20  6:24 PM  Result Value Ref Range   Color, Urine AMBER (A) YELLOW   APPearance HAZY (A) CLEAR   Specific Gravity, Urine 1.024 1.005 - 1.030   pH 7.0 5.0 - 8.0   Glucose, UA NEGATIVE NEGATIVE mg/dL   Hgb urine dipstick NEGATIVE NEGATIVE   Bilirubin Urine NEGATIVE NEGATIVE   Ketones, ur 80 (A) NEGATIVE mg/dL   Protein, ur 30 (A) NEGATIVE mg/dL   Nitrite NEGATIVE NEGATIVE   Leukocytes,Ua MODERATE (A) NEGATIVE   RBC / HPF 0-5 0 - 5 RBC/hpf   WBC, UA 11-20 0 - 5 WBC/hpf   Bacteria, UA MANY (A) NONE SEEN   Squamous Epithelial / LPF 11-20 0 - 5   Mucus PRESENT   SARS Coronavirus 2 by RT PCR (hospital order, performed in [facility] hospital lab) Nasopharyngeal Nasopharyngeal Swab   Collection Time: 01/04/20  6:55 PM   Specimen: Nasopharyngeal Swab  Result Value Ref Range   SARS Coronavirus 2 NEGATIVE NEGATIVE  Wet prep, genital   Collection Time: 01/04/20  7:10 PM  Result Value Ref Range   Yeast Wet Prep HPF POC NONE SEEN NONE SEEN   Trich, Wet Prep  Appears normal  Thoracic:              Appears normal         Spine:                  Appears normal  Heart:                 Appears normal         Upper Extremities:      Appears normal                         (4CH, axis, and                         situs)  RVOT:                  Appears normal         Lower Extremities:      Appears normal  LVOT:                  Not well visualized  Other:  Fetus appears to be a female. Heels visualized. Nasal bone visualized.          Open hands visualized. ---------------------------------------------------------------------- Cervix Uterus Adnexa  Cervix  Length:           3.06  cm.  Normal appearance by transabdominal scan.  Right Ovary  Within normal limits.  Left Ovary  Within normal limits.  Adnexa  No abnormality visualized.  ---------------------------------------------------------------------- Comments  This patient was seen for a detailed fetal anatomy scan.  She denies any significant past medical history and denies  any problems in her current pregnancy.  She had a cell free DNA test earlier in her pregnancy which  indicated a low risk for trisomy 85, 84, and 13. A female fetus is  predicted.  She was informed that the fetal growth and amniotic fluid  level were appropriate for her gestational age.  There were no obvious fetal anomalies noted on today's  ultrasound exam.  However, the views of the fetal anatomy  were limited today due to the fetal position.  The patient was informed that anomalies may be missed due  to technical limitations. If the fetus is in a suboptimal position  or maternal habitus is increased, visualization of the fetus in  the maternal uterus may be impaired.  A follow-up exam was scheduled in 4 weeks to complete the  views of the fetal anatomy. ----------------------------------------------------------------------                   Ma Rings, MD Electronically Signed Final Report   12/08/2019 11:51 am ----------------------------------------------------------------------  [place] [facility] OB FOLLOW UP  Result Date: 01/05/2020 ----------------------------------------------------------------------  OBSTETRICS REPORT                       (Signed Final 01/05/2020 01:55 pm) ---------------------------------------------------------------------- Patient Info  ID #:       [id number]                          D.O.B.:  04-19-2001 (19 yrs)  Name:       Jacqueline [name]                Visit Date: 01/05/2020 11:25 am ---------------------------------------------------------------------- Performed By  Attending:        Ma Rings MD

## 2020-01-07 NOTE — Discharge Instructions (Signed)
Pyelonephritis During Pregnancy ° °What are the causes? °This condition is caused by a bacterial infection in the lower urinary tract that spreads to the kidney. °What increases the risk? °You are more likely to develop this condition if: °· You have diabetes. °· You have a history of frequent urinary tract infections. °What are the signs or symptoms? °Symptoms of this condition may begin with symptoms of a lower urinary tract infection. These may include: °· A frequent urge to pass urine. °· Burning pain when passing urine. °· Pain and pressure in your lower abdomen. °· Blood in your urine. °· Cloudy or smelly urine. °As the infection spreads to your kidney, you may have these symptoms: °· Fever. °· Chills. °· Pain and tenderness in your upper abdomen or in your back and sides (flank pain). Flank pain often affects one side of the body, usually the right side. °· Nausea, vomiting, or loss of appetite. °How is this diagnosed? °This condition may be diagnosed based on: °· Your symptoms and medical history. °· A physical exam. °· Tests to confirm the diagnosis. These may include: °? Blood tests to check kidney function and look for signs of infection. °? Urine tests to check for signs of infection, including bacteria, white or red blood cells, and protein. °? Tests to grow and identify the type of bacteria that is causing the infection (urine culture). °? Imaging studies of your kidneys to learn more about your condition. °How is this treated? °This condition is treated in the hospital with antibiotics that are given into one of your veins through an IV. Your health care provider: °· Will start you on an antibiotic that is effective against common urinary tract infections. °· May switch to another antibiotic if the results of your urine culture show that your infection is caused by different bacteria. °· Will be careful to choose antibiotics that are the safest during pregnancy. °Other treatments may include: °· IV  fluids if you are nauseous and not able to drink fluids. °· Pain and fever medicines. °· Medicines for nausea and vomiting. °You will be able to go home when your infection is under control. To prevent another infection, you may need to continue taking antibiotics by mouth until your baby is born. °Follow these instructions at home: °Medicines ° °· Take over-the-counter and prescription medicines only as told by your health care provider. °· Take your antibiotic medicine as told by your health care provider. Do not stop taking the antibiotic even if you start to feel better. °· Continue to take your prenatal vitamins. °Lifestyle ° °· Follow instructions from your health care provider about eating or drinking restrictions. You may need to avoid: °? Foods and drinks with added sugar. °? Caffeine and fruit juice. °· Drink enough fluid to keep your urine pale yellow. °· Go to the bathroom frequently. Do not hold your urine. Try to empty your bladder completely. °· Change your underwear every day. Wear all-cotton underwear. Do not wear tight underwear or pants. °General instructions ° °· Take these steps to lower the risk of bacteria getting into your urinary tract: °? Use liquid soap instead of bar soap when showering or bathing. Bacteria can grow on bar soap. °? When you wash yourself, clean the urethra opening first. Use a washcloth to clean the area between your vagina and anus. Pat the area dry with a clean towel. °? Wash your hands before and after you go to the bathroom. °? Wipe yourself from front to back   after going to the bathroom. °? Do not use douches, perfumed soap, creams, or powders. °? Do not soak in a bath for more than 30 minutes. °· Return to your normal activities as told by your health care provider. Ask your health care provider what activities are safe for you. °· Keep all follow-up visits as told by your health care provider. This is important. °Contact a health care provider if: °· You have  chills or a fever. °· You have any symptoms of infection that do not get better at home. °· Symptoms of infection come back. °· You have a reaction or side effects from your antibiotic. °Get help right away if: °· You start having contractions. °Summary °· Pyelonephritis is an infection of the kidney or kidneys. °· This condition results when a bacterial infection in the lower urinary tract spreads to the kidney. °· Lower urinary tract infections are common during pregnancy. °· Pyelonephritis causes chills, a fever, flank pain, and nausea. °· Pyelonephritis is a serious infection that is usually treated in the hospital with IV antibiotics. °This information is not intended to replace advice given to you by your health care provider. Make sure you discuss any questions you have with your health care provider. °Document Revised: 09/11/2018 Document Reviewed: 08/21/2017 °Elsevier Patient Education © 2020 [employer] ° °

## 2020-01-09 LAB — CULTURE, BLOOD (ROUTINE X 2)
Culture: NO GROWTH
Culture: NO GROWTH
Special Requests: ADEQUATE
Special Requests: ADEQUATE

## 2020-01-10 ENCOUNTER — Other Ambulatory Visit: Payer: Self-pay

## 2020-01-10 ENCOUNTER — Ambulatory Visit (INDEPENDENT_AMBULATORY_CARE_PROVIDER_SITE_OTHER): Payer: Medicaid Other | Admitting: Advanced Practice Midwife

## 2020-01-10 VITALS — BP 103/61 | HR 75 | Temp 98.9°F | Wt 160.2 lb

## 2020-01-10 DIAGNOSIS — Z3480 Encounter for supervision of other normal pregnancy, unspecified trimester: Secondary | ICD-10-CM

## 2020-01-10 DIAGNOSIS — Z3A24 24 weeks gestation of pregnancy: Secondary | ICD-10-CM

## 2020-01-10 DIAGNOSIS — O09899 Supervision of other high risk pregnancies, unspecified trimester: Secondary | ICD-10-CM

## 2020-01-10 DIAGNOSIS — O09299 Supervision of pregnancy with other poor reproductive or obstetric history, unspecified trimester: Secondary | ICD-10-CM

## 2020-01-10 NOTE — Progress Notes (Addendum)
   PRENATAL VISIT NOTE  Subjective:  Jacqueline [name] is a 19 y.o. G2P0102 at [redacted]w[redacted]d being seen today for ongoing prenatal care.  She is currently monitored for the following issues for this low-risk pregnancy and has Anemia affecting pregnancy in second trimester; Supervision of other normal pregnancy, antepartum; Hx of preeclampsia, prior pregnancy, currently pregnant; Hx of postpartum hemorrhage, currently pregnant; Short interval between pregnancies complicating pregnancy, antepartum; Chlamydia trachomatis infection during pregnancy in first trimester; Current nicotine use; GBS bacteriuria; Nausea and vomiting during pregnancy prior to [redacted] weeks gestation; and Pyelonephritis affecting pregnancy in second trimester on their problem list.  Patient reports no complaints.  Contractions: Not present. Vag. Bleeding: None.  Movement: Present. Denies leaking of fluid.   The following portions of the patient's history were reviewed and updated as appropriate: allergies, current medications, past family history, past medical history, past social history, past surgical history and problem list.   Objective:   Vitals:   01/10/20 0948  BP: 103/61  Pulse: 75  Temp: 98.9 F (37.2 C)  Weight: 160 lb 3.2 oz (72.7 kg)    Fetal Status: Fetal Heart Rate (bpm): 140   Movement: Present     General:  Alert, oriented and cooperative. Patient is in no acute distress.  Skin: Skin is warm and dry. No rash noted.   Cardiovascular: Normal heart rate noted  Respiratory: Normal respiratory effort, no problems with respiration noted  Abdomen: Soft, gravid, appropriate for gestational age.  Pain/Pressure: Absent     Pelvic: Cervical exam deferred        Extremities: Normal range of motion.  Edema: None  Mental Status: Normal mood and affect. Normal behavior. Normal judgment and thought content.   Assessment and Plan:  Pregnancy: G2P0102 at [redacted]w[redacted]d 1. Supervision of other normal pregnancy, antepartum --Anticipatory  guidance about next visits/weeks of pregnancy given. --Interest in waterbirth.  Had PEC last pregnancy with twins. --Questions answered, consent signed today. Pt to see midwives for most visits.  2. Hx of preeclampsia, prior pregnancy, currently pregnant --Normotensive this pregnancy, including at today's visit  3. Short interval between pregnancies complicating pregnancy, antepartum  4. [redacted] weeks gestation of pregnancy   Preterm labor symptoms and general obstetric precautions including but not limited to vaginal bleeding, contractions, leaking of fluid and fetal movement were reviewed in detail with the patient. Please refer to After Visit Summary for other counseling recommendations.   No follow-ups on file.  No future appointments.  Sharen Counter, CNM

## 2020-01-10 NOTE — Patient Instructions (Signed)
Considering Waterbirth? °Guide for patients at [facility] °Why consider waterbirth? °• Gentle birth for babies  °• Less pain medicine used in labor  °• May allow for passive descent/less pushing  °• May reduce perineal tears  °• More mobility and instinctive maternal position changes  °• Increased maternal relaxation  °• Reduced blood pressure in labor  ° °Is waterbirth safe? What are the risks of infection, drowning or other complications? °• Infection:  °• Very low risk (3.7 % for tub vs 4.8% for bed)  °• 7 in 8000 waterbirths with documented infection  °• Poorly cleaned equipment most common cause  °• Slightly lower group B strep transmission rate  °• Drowning  °• Maternal:  °• Very low risk  °• Related to seizures or fainting  °• Newborn:  °• Very low risk. No evidence of increased risk of respiratory problems in multiple large studies  °• Physiological protection from breathing under water  °• Avoid underwater birth if there are any fetal complications  °• Once baby's head is out of the water, keep it out.  °• Birth complication  °• Some reports of cord trauma, but risk decreased by bringing baby to surface gradually  °• No evidence of increased risk of shoulder dystocia. Mothers can usually change positions faster in water than in a bed, possibly aiding the maneuvers to free the shoulder.  °? °You must attend a Waterbirth class at [facility] [place] °• 3rd Wednesday of every month from 7-9pm  °• Free  °• Register by calling [phone] or online at [email or web address]  °• Bring us the certificate from the class to your prenatal appointment  °Meet with a midwife at 36 weeks to see if you can still plan a waterbirth and to sign the consent.  ° °If you plan a waterbirth at [facility] at , you can opt to purchase the following: °• Fish Net °• Bathing suit top (optional)  °• Long-handled mirror (optional)  °•  °Things that would  prevent you from having a waterbirth: °• Unknown or Positive COVID-19 diagnosis upon admission to hospital  °• Premature, <37wks  °• Previous cesarean birth  °• Presence of thick meconium-stained fluid  °• Multiple gestation (Twins, triplets, etc.)  °• Uncontrolled diabetes or gestational diabetes requiring medication  °• Hypertension requiring medication or diagnosis of pre-eclampsia  °• Heavy vaginal bleeding  °• Non-reassuring fetal heart rate  °• Active infection (MRSA, etc.). Group B Strep is NOT a contraindication for waterbirth.  °• If your labor has to be induced and induction method requires continuous monitoring of the baby's heart rate  °• Other risks/issues identified by your obstetrical provider  °Please remember that birth is unpredictable. Under certain unforeseeable circumstances your provider may advise against giving birth in the tub. These decisions will be made on a case-by-case basis and with the safety of you and your baby as our highest priority. ° °**Please remember that in order to have a waterbirth, you must test Negative to COVID-19 upon admission to the hospital.** ° °

## 2020-02-08 ENCOUNTER — Encounter: Payer: Self-pay | Admitting: Obstetrics and Gynecology

## 2020-02-08 ENCOUNTER — Other Ambulatory Visit: Payer: Self-pay

## 2020-02-08 ENCOUNTER — Ambulatory Visit (INDEPENDENT_AMBULATORY_CARE_PROVIDER_SITE_OTHER): Payer: Medicaid Other | Admitting: Obstetrics and Gynecology

## 2020-02-08 ENCOUNTER — Other Ambulatory Visit (HOSPITAL_COMMUNITY)
Admission: RE | Admit: 2020-02-08 | Discharge: 2020-02-08 | Disposition: A | Discharge status: Home / Self Care | Payer: Medicaid Other | Source: Ambulatory Visit | Attending: Obstetrics and Gynecology | Admitting: Obstetrics and Gynecology

## 2020-02-08 ENCOUNTER — Other Ambulatory Visit: Payer: Self-pay | Admitting: Obstetrics and Gynecology

## 2020-02-08 ENCOUNTER — Other Ambulatory Visit: Payer: Medicaid Other

## 2020-02-08 VITALS — BP 118/60 | HR 70 | Wt 163.0 lb

## 2020-02-08 DIAGNOSIS — R8271 Bacteriuria: Secondary | ICD-10-CM

## 2020-02-08 DIAGNOSIS — O98311 Other infections with a predominantly sexual mode of transmission complicating pregnancy, first trimester: Secondary | ICD-10-CM | POA: Insufficient documentation

## 2020-02-08 DIAGNOSIS — Z3480 Encounter for supervision of other normal pregnancy, unspecified trimester: Secondary | ICD-10-CM

## 2020-02-08 DIAGNOSIS — A568 Sexually transmitted chlamydial infection of other sites: Secondary | ICD-10-CM

## 2020-02-08 DIAGNOSIS — Z3A28 28 weeks gestation of pregnancy: Secondary | ICD-10-CM

## 2020-02-08 DIAGNOSIS — O09299 Supervision of pregnancy with other poor reproductive or obstetric history, unspecified trimester: Secondary | ICD-10-CM

## 2020-02-08 NOTE — Progress Notes (Signed)
   LOW-RISK PREGNANCY OFFICE VISIT Patient name: Jacqueline [name] Clay MRN [id number]  Date of birth: 2000/10/25 Chief Complaint:   Routine Prenatal Visit  History of Present Illness:   Jacqueline [name] Olkowski is a 19 y.o. G64P0102 female at [redacted]w[redacted]d with an Estimated Date of Delivery: 04/30/20 being seen today for ongoing management of a low-risk pregnancy.  Today she reports no complaints. Contractions: Not present. Vag. Bleeding: None.  Movement: Present. denies leaking of fluid. Review of Systems:   Pertinent items are noted in HPI Denies abnormal vaginal discharge w/ itching/odor/irritation, headaches, visual changes, shortness of breath, chest pain, abdominal pain, severe nausea/vomiting, or problems with urination or bowel movements unless otherwise stated above. Pertinent History Reviewed:  Reviewed past medical,surgical, social, obstetrical and family history.  Reviewed problem list, medications and allergies. Physical Assessment:   Vitals:   02/08/20 1036  BP: 118/60  Pulse: 70  Weight: 163 lb (73.9 kg)  Body mass index is 28.87 kg/m.        Physical Examination:   General appearance: Well appearing, and in no distress  Mental status: Alert, oriented to person, place, and time  Skin: Warm & dry  Cardiovascular: Normal heart rate noted  Respiratory: Normal respiratory effort, no distress  Abdomen: Soft, gravid, nontender  Pelvic: Cervical exam deferred         Extremities: Edema: None  Fetal Status: Fetal Heart Rate (bpm): 152   Movement: Present    No results found for this or any previous visit (from the past 24 hour(s)).  Assessment & Plan:  1) Low-risk pregnancy G2P0102 at [redacted]w[redacted]d with an Estimated Date of Delivery: 04/30/20   2) Supervision of other normal pregnancy, antepartum - Advised to get registered for and take the waterbirth class - Consent was reported to be signed at last visit, but not seen in scanned documents - Continue with CNM visits  3) Hx of preeclampsia,  prior pregnancy, currently pregnant - Taking bASA daily  4) GBS bacteriuria - Will not need GBS swab at 36 wks - treat with abx in labor  5) [redacted] weeks gestation of pregnancy  6) Chlamydia trachomatis infection during pregnancy in first trimester - TOC today   Meds: No orders of the defined types were placed in this encounter.  Labs/procedures today: 2 hr GTT, 3rd trimester labs, Wet Prep  Plan:  Continue routine obstetrical care   Reviewed: Preterm labor symptoms and general obstetric precautions including but not limited to vaginal bleeding, contractions, leaking of fluid and fetal movement were reviewed in detail with the patient.  All questions were answered. Has home bp cuff. Check bp weekly, let us know if >140/90.   Follow-up: No follow-ups on file.  No orders of the defined types were placed in this encounter.  Raelyn Mora MSN, CNM 02/08/2020 10:56 AM

## 2020-02-08 NOTE — Patient Instructions (Signed)
Fetal Movement Counts Patient Name: ________________________________________________ Patient Due Date: ____________________ What is a fetal movement count?  A fetal movement count is the number of times that you feel your baby move during a certain amount of time. This may also be called a fetal kick count. A fetal movement count is recommended for every pregnant woman. You may be asked to start counting fetal movements as early as week 28 of your pregnancy. Pay attention to when your baby is most active. You may notice your baby's sleep and wake cycles. You may also notice things that make your baby move more. You should do a fetal movement count:  When your baby is normally most active.  At the same time each day. A good time to count movements is while you are resting, after having something to eat and drink. How do I count fetal movements? 1. Find a quiet, comfortable area. Sit, or lie down on your side. 2. Write down the date, the start time and stop time, and the number of movements that you felt between those two times. Take this information with you to your health care visits. 3. Write down your start time when you feel the first movement. 4. Count kicks, flutters, swishes, rolls, and jabs. You should feel at least 10 movements. 5. You may stop counting after you have felt 10 movements, or if you have been counting for 2 hours. Write down the stop time. 6. If you do not feel 10 movements in 2 hours, contact your health care provider for further instructions. Your health care provider may want to do additional tests to assess your baby's well-being. Contact a health care provider if:  You feel fewer than 10 movements in 2 hours.  Your baby is not moving like he or she usually does. Date: ____________ Start time: ____________ Stop time: ____________ Movements: ____________ Date: ____________ Start time: ____________ Stop time: ____________ Movements: ____________ Date: ____________  Start time: ____________ Stop time: ____________ Movements: ____________ Date: ____________ Start time: ____________ Stop time: ____________ Movements: ____________ Date: ____________ Start time: ____________ Stop time: ____________ Movements: ____________ Date: ____________ Start time: ____________ Stop time: ____________ Movements: ____________ Date: ____________ Start time: ____________ Stop time: ____________ Movements: ____________ Date: ____________ Start time: ____________ Stop time: ____________ Movements: ____________ Date: ____________ Start time: ____________ Stop time: ____________ Movements: ____________ This information is not intended to replace advice given to you by your health care provider. Make sure you discuss any questions you have with your health care provider. Document Revised: 01/07/2019 Document Reviewed: 01/07/2019 Elsevier Patient Education  2020 Elsevier Inc. Iron-Rich Diet  Iron is a mineral that helps your body to produce hemoglobin. Hemoglobin is a protein in red blood cells that carries oxygen to your body's tissues. Eating too little iron may cause you to feel weak and tired, and it can increase your risk of infection. Iron is naturally found in many foods, and many foods have iron added to them (iron-fortified foods). You may need to follow an iron-rich diet if you do not have enough iron in your body due to certain medical conditions. The amount of iron that you need each day depends on your age, your sex, and any medical conditions you have. Follow instructions from your health care provider or a diet and nutrition specialist (dietitian) about how much iron you should eat each day. What are tips for following this plan? Reading food labels  Check food labels to see how many milligrams (mg) of iron are in each   serving. Cooking  Cook foods in pots and pans that are made from iron.  Take these steps to make it easier for your body to absorb iron from certain  foods: ? Soak beans overnight before cooking. ? Soak whole grains overnight and drain them before using. ? Ferment flours before baking, such as by using yeast in bread dough. Meal planning  When you eat foods that contain iron, you should eat them with foods that are high in vitamin C. These include oranges, peppers, tomatoes, potatoes, and mango. Vitamin C helps your body to absorb iron. General information  Take iron supplements only as told by your health care provider. An overdose of iron can be life-threatening. If you were prescribed iron supplements, take them with orange juice or a vitamin C supplement.  When you eat iron-fortified foods or take an iron supplement, you should also eat foods that naturally contain iron, such as meat, poultry, and fish. Eating naturally iron-rich foods helps your body to absorb the iron that is added to other foods or contained in a supplement.  Certain foods and drinks prevent your body from absorbing iron properly. Avoid eating these foods in the same meal as iron-rich foods or with iron supplements. These foods include: ? Coffee, black tea, and red wine. ? Milk, dairy products, and foods that are high in calcium. ? Beans and soybeans. ? Whole grains. What foods should I eat? Fruits Prunes. Raisins. Eat fruits high in vitamin C, such as oranges, grapefruits, and strawberries, alongside iron-rich foods. Vegetables Spinach (cooked). Green peas. Broccoli. Fermented vegetables. Eat vegetables high in vitamin C, such as leafy greens, potatoes, bell peppers, and tomatoes, alongside iron-rich foods. Grains Iron-fortified breakfast cereal. Iron-fortified whole-wheat bread. Enriched rice. Sprouted grains. Meats and other proteins Beef liver. Oysters. Beef. Shrimp. Turkey. Chicken. Tuna. Sardines. Chickpeas. Nuts. Tofu. Pumpkin seeds. Beverages Tomato juice. Fresh orange juice. Prune juice. Hibiscus tea. Fortified instant breakfast shakes. Sweets and  desserts Blackstrap molasses. Seasonings and condiments Tahini. Fermented soy sauce. Other foods Wheat germ. The items listed above may not be a complete list of recommended foods and beverages. Contact a dietitian for more information. What foods should I avoid? Grains Whole grains. Bran cereal. Bran flour. Oats. Meats and other proteins Soybeans. Products made from soy protein. Black beans. Lentils. Mung beans. Split peas. Dairy Milk. Cream. Cheese. Yogurt. Cottage cheese. Beverages Coffee. Black tea. Red wine. Sweets and desserts Cocoa. Chocolate. Ice cream. Other foods Basil. Oregano. Large amounts of parsley. The items listed above may not be a complete list of foods and beverages to avoid. Contact a dietitian for more information. Summary  Iron is a mineral that helps your body to produce hemoglobin. Hemoglobin is a protein in red blood cells that carries oxygen to your body's tissues.  Iron is naturally found in many foods, and many foods have iron added to them (iron-fortified foods).  When you eat foods that contain iron, you should eat them with foods that are high in vitamin C. Vitamin C helps your body to absorb iron.  Certain foods and drinks prevent your body from absorbing iron properly, such as whole grains and dairy products. You should avoid eating these foods in the same meal as iron-rich foods or with iron supplements. This information is not intended to replace advice given to you by your health care provider. Make sure you discuss any questions you have with your health care provider. Document Revised: 05/02/2017 Document Reviewed: 04/15/2017 Elsevier Patient Education  2020 Elsevier   Inc.  

## 2020-02-08 NOTE — Progress Notes (Signed)
Pt presents for ROB and 2 gtt labs. Tdap and flu declined per pt  TOC GC due today

## 2020-02-09 LAB — CBC
Hematocrit: 31.8 % — ABNORMAL LOW (ref 34.0–46.6)
Hemoglobin: 10.3 g/dL — ABNORMAL LOW (ref 11.1–15.9)
MCH: 27.6 pg (ref 26.6–33.0)
MCHC: 32.4 g/dL (ref 31.5–35.7)
MCV: 85 fL (ref 79–97)
Platelets: 330 10*3/uL (ref 150–450)
RBC: 3.73 x10E6/uL — ABNORMAL LOW (ref 3.77–5.28)
RDW: 12.7 % (ref 11.7–15.4)
WBC: 9.3 10*3/uL (ref 3.4–10.8)

## 2020-02-09 LAB — CERVICOVAGINAL ANCILLARY ONLY
Chlamydia: NEGATIVE
Comment: NEGATIVE
Comment: NORMAL
Neisseria Gonorrhea: NEGATIVE

## 2020-02-09 LAB — RPR: RPR Ser Ql: NONREACTIVE

## 2020-02-09 LAB — HIV ANTIBODY (ROUTINE TESTING W REFLEX): HIV Screen 4th Generation wRfx: NONREACTIVE

## 2020-02-09 LAB — GLUCOSE TOLERANCE, 2 HOURS W/ 1HR
Glucose, 1 hour: 64 mg/dL — ABNORMAL LOW (ref 65–179)
Glucose, 2 hour: 70 mg/dL (ref 65–152)
Glucose, Fasting: 74 mg/dL (ref 65–91)

## 2020-02-12 ENCOUNTER — Inpatient Hospital Stay (HOSPITAL_COMMUNITY)
Admission: AD | Admit: 2020-02-12 | Discharge: 2020-02-13 | Disposition: A | Discharge status: Home / Self Care | Payer: Medicaid Other | Attending: Obstetrics & Gynecology | Admitting: Obstetrics & Gynecology

## 2020-02-12 ENCOUNTER — Other Ambulatory Visit: Payer: Self-pay

## 2020-02-12 DIAGNOSIS — Z7982 Long term (current) use of aspirin: Secondary | ICD-10-CM | POA: Insufficient documentation

## 2020-02-12 DIAGNOSIS — Z3689 Encounter for other specified antenatal screening: Secondary | ICD-10-CM

## 2020-02-12 DIAGNOSIS — R109 Unspecified abdominal pain: Secondary | ICD-10-CM | POA: Insufficient documentation

## 2020-02-12 DIAGNOSIS — Z3A29 29 weeks gestation of pregnancy: Secondary | ICD-10-CM | POA: Insufficient documentation

## 2020-02-12 DIAGNOSIS — R102 Pelvic and perineal pain: Secondary | ICD-10-CM | POA: Insufficient documentation

## 2020-02-12 DIAGNOSIS — O26893 Other specified pregnancy related conditions, third trimester: Secondary | ICD-10-CM | POA: Insufficient documentation

## 2020-02-12 DIAGNOSIS — O4703 False labor before 37 completed weeks of gestation, third trimester: Secondary | ICD-10-CM | POA: Insufficient documentation

## 2020-02-13 ENCOUNTER — Encounter (HOSPITAL_COMMUNITY): Payer: Self-pay | Admitting: Obstetrics & Gynecology

## 2020-02-13 DIAGNOSIS — R102 Pelvic and perineal pain: Secondary | ICD-10-CM | POA: Diagnosis not present

## 2020-02-13 DIAGNOSIS — Z3A29 29 weeks gestation of pregnancy: Secondary | ICD-10-CM

## 2020-02-13 DIAGNOSIS — R109 Unspecified abdominal pain: Secondary | ICD-10-CM | POA: Diagnosis present

## 2020-02-13 DIAGNOSIS — O26893 Other specified pregnancy related conditions, third trimester: Secondary | ICD-10-CM | POA: Diagnosis not present

## 2020-02-13 DIAGNOSIS — O4703 False labor before 37 completed weeks of gestation, third trimester: Secondary | ICD-10-CM | POA: Diagnosis not present

## 2020-02-13 DIAGNOSIS — Z7982 Long term (current) use of aspirin: Secondary | ICD-10-CM | POA: Diagnosis not present

## 2020-02-13 LAB — WET PREP, GENITAL
Sperm: NONE SEEN
Trich, Wet Prep: NONE SEEN
Yeast Wet Prep HPF POC: NONE SEEN

## 2020-02-13 LAB — AMNISURE RUPTURE OF MEMBRANE (ROM) NOT AT ARMC: Amnisure ROM: NEGATIVE

## 2020-02-13 MED ORDER — BETAMETHASONE SOD PHOS & ACET 6 (3-3) MG/ML IJ SUSP
12.0000 mg | Freq: Once | INTRAMUSCULAR | Status: AC
  Administered 2020-02-13: 12 mg via INTRAMUSCULAR
  Filled 2020-02-13: qty 5

## 2020-02-13 MED ORDER — LACTATED RINGERS IV BOLUS
250.0000 mL | Freq: Once | INTRAVENOUS | Status: AC
  Administered 2020-02-13: 250 mL via INTRAVENOUS

## 2020-02-13 MED ORDER — NIFEDIPINE 10 MG PO CAPS: 10.0000 mg | ORAL_CAPSULE | Freq: Four times a day (QID) | ORAL | 1 refills | Status: DC | PRN

## 2020-02-13 MED ORDER — TERBUTALINE SULFATE 1 MG/ML IJ SOLN
0.2500 mg | Freq: Once | INTRAMUSCULAR | Status: AC
  Administered 2020-02-13: 0.25 mg via SUBCUTANEOUS
  Filled 2020-02-13: qty 1

## 2020-02-13 MED ORDER — FENTANYL CITRATE (PF) 100 MCG/2ML IJ SOLN
100.0000 ug | Freq: Once | INTRAMUSCULAR | Status: AC
  Administered 2020-02-13: 100 ug via INTRAVENOUS
  Filled 2020-02-13: qty 2

## 2020-02-13 MED ORDER — LACTATED RINGERS IV BOLUS
1000.0000 mL | Freq: Once | INTRAVENOUS | Status: AC
  Administered 2020-02-13: 1000 mL via INTRAVENOUS

## 2020-02-13 NOTE — Discharge Instructions (Signed)
Betamethasone injection What is this medicine? BETAMETHASONE (bay ta METH a sone) is a corticosteroid. It helps to reduce swelling, redness, itching, and allergic reactions. It is used to treat asthma, allergies, arthritis, Crohn's disease, and ulcerative colitis. It is also used for other conditions, like blood disorders and diseases of the adrenal glands. This medicine may be used for other purposes; ask your health care provider or pharmacist if you have questions. COMMON BRAND NAME(S): Adbeon, Beta 1 Kit, BSP 0820, Celestone What should I tell my health care provider before I take this medicine? They need to know if you have any of these conditions:  blood clotting problems  Cushing's syndrome  diabetes  eye disease, vision problems  glaucoma or cataracts  heart problems or disease  high blood pressure  infection like chickenpox, fungus, herpes, measles, or tuberculosis  kidney disease  liver disease  mental problems  myasthenia gravis  osteoporosis  seizures  stomach, intestinal disease  an unusual or allergic reaction to betamethasone, corticosteroids, other medicines, foods, dyes, or preservatives  pregnant or trying to get pregnant  breast-feeding How should I use this medicine? This medicine is for injection into a muscle, joint, lesion, or other tissue. It is given by a health care professional in a hospital or clinic setting. Talk to your pediatrician regarding the use of this medicine in children. Special care may be needed. Overdosage: If you think you have taken too much of this medicine contact a poison control center or emergency room at once. NOTE: This medicine is only for you. Do not share this medicine with others. What if I miss a dose? This does not apply. What may interact with this medicine? Do not take this medicine with any of the following medications:  mifepristone This medicine may also interact with the following  medications:  aspirin  vaccines  warfarin This list may not describe all possible interactions. Give your health care provider a list of all the medicines, herbs, non-prescription drugs, or dietary supplements you use. Also tell them if you smoke, drink alcohol, or use illegal drugs. Some items may interact with your medicine. What should I watch for while using this medicine? Visit your doctor or health care professional for regular checks on your progress. Tell your doctor or healthcare professional if your symptoms do not start to get better or if they get worse. If you are taking this medicine over a prolonged period, carry an identification card with your name and address, the type and dose of your medicine, and your doctor's name and address. This medicine may increase your risk of getting an infection. Stay away from people who are sick. Tell your doctor or health care professional if you are around anyone with measles or chickenpox. If you are going to have surgery, tell your doctor or health care professional that you have taken this medicine within the last twelve months. Ask your doctor or health care professional about your diet. You may need to lower the amount of salt you eat. This medicine may increase blood sugar. Ask your healthcare provider if changes in diet or medicines are needed if you have diabetes. You may need to avoid immunization with certain vaccines while you are taking this medicine. Tell your doctor if you have taken this medicine before receiving any vaccine. What side effects may I notice from receiving this medicine? Side effects that you should report to your doctor or health care professional as soon as possible:  allergic reactions like skin rash,  itching or hives, swelling of the face, lips, or tongue  black, tarry stools  breathing problems  bulging eyes  fever, sore throat, infection, sores that do not heal  frequent passing of urine  high blood  pressure  pain in hips, back, ribs, arms, shoulders, or legs  signs and symptoms of high blood sugar such as being more thirsty or hungry or having to urinate more than normal. You may also feel very tired or have blurry vision.  swelling of feet or lower legs Side effects that usually do not require medical attention (report to your doctor or health care professional if they continue or are bothersome):  confusion, excitement, restlessness  headache  nausea, vomiting  skin problems, acne, thin and shiny skin  stomach upset  trouble sleeping  weight gain This list may not describe all possible side effects. Call your doctor for medical advice about side effects. You may report side effects to FDA at 1-800-FDA-1088. Where should I keep my medicine? This drug is given in a hospital or clinic and will not be stored at home. NOTE: This sheet is a summary. It may not cover all possible information. If you have questions about this medicine, talk to your doctor, pharmacist, or health care provider.  2020 Elsevier/Gold Standard (2018-02-18 09:53:14)

## 2020-02-13 NOTE — MAU Provider Note (Signed)
History     CSN: [id number]  Arrival date and time: 02/12/20 2358   First Provider Initiated Contact with Patient 02/13/20 0030      Chief Complaint  Patient presents with   Abdominal Pain   Pelvic Pain   [place] is a 19 y.o. G2P0102 at [redacted]w[redacted]d who receives care at [facility].  She presents today for Abdominal Pain and Pelvic Pain.  Patient states she started contracting about one hour ago.  She states she was trying to rest when the contractions started.  Patient endorses fetal movement and denies vaginal bleeding or discharge.  Patient appears uncomfortable and breathing with contractions.  She reports last sexual activity on Thursday.    OB History    Gravida  2   Para  1   Term  0   Preterm  1   AB  0   Living  2     SAB  0   TAB  0   Ectopic  0   Multiple  1   Live Births  2           Past Medical History:  Diagnosis Date   Asthma    Migraines    Pregnancy induced hypertension     Past Surgical History:  Procedure Laterality Date   WISDOM TOOTH EXTRACTION      Family History  Problem Relation Age of Onset   Hypertension Mother    Asthma Brother    Hypertension Brother    Diabetes Brother    Hypertension Maternal Aunt    Diabetes Maternal Aunt    Autism spectrum disorder Maternal Aunt    Hypertension Maternal Grandmother    Aneurysm Maternal Uncle     Social History   Tobacco Use   Smoking status: Never Smoker   Smokeless tobacco: Never Used  Vaping Use   Vaping Use: Former  Substance Use Topics   Alcohol use: Never    Alcohol/week: 0.0 standard drinks   Drug use: Not Currently    Types: Marijuana    Comment: NOT SMOKED DURING PREG    Allergies:  Allergies  Allergen Reactions   Tomato Other (See Comments)    Makes asthma worse    Medications Prior to Admission  Medication Sig Dispense Refill Last Dose   aspirin EC 81 MG tablet Take 1 tablet (81 mg total) by mouth daily. Take after 12 weeks  for prevention of preeclampsia later in pregnancy. Start taking on May 18th 300 tablet 0 02/12/2020 at Unknown time   Prenatal Vit-Fe Fumarate-FA (MULTIVITAMIN-PRENATAL) 27-0.8 MG TABS tablet Take 1 tablet by mouth daily at 12 noon.   02/12/2020 at Unknown time   acetaminophen (TYLENOL) 325 MG tablet Take 650 mg by mouth every 6 (six) hours as needed for mild pain or headache. (Patient not taking: Reported on 02/08/2020)      cephALEXin (KEFLEX) 500 MG capsule Take 1 capsule (500 mg total) by mouth 2 (two) times daily. Begin after completion of Duricef (Patient not taking: Reported on 02/08/2020) 180 capsule 2    famotidine (PEPCID) 20 MG tablet Take 1 tablet (20 mg total) by mouth 2 (two) times daily. 60 tablet 0     Review of Systems  Gastrointestinal: Positive for abdominal pain. Negative for constipation, diarrhea, nausea and vomiting.  Genitourinary: Positive for dysuria (Pressure). Negative for difficulty urinating, vaginal bleeding and vaginal discharge.   Physical Exam   Blood pressure 116/65, pulse 98, temperature 98.1 F (36.7 C), temperature source Oral, last menstrual  period 07/07/2019, currently breastfeeding.  Physical Exam Vitals and nursing note reviewed. Exam conducted with a chaperone present.  Constitutional:      General: She is in acute distress.     Appearance: She is well-developed.  HENT:     Head: Normocephalic and atraumatic.  Eyes:     Conjunctiva/sclera: Conjunctivae normal.  Cardiovascular:     Rate and Rhythm: Normal rate and regular rhythm.  Pulmonary:     Effort: Pulmonary effort is normal. No respiratory distress.     Breath sounds: Normal breath sounds.  Abdominal:     Palpations: Abdomen is soft.     Tenderness: There is no abdominal tenderness.     Comments: Gravid, Appears AGA  Genitourinary:    Labia:        Right: No tenderness or lesion.        Left: No tenderness or lesion.      Vagina: Vaginal discharge present. No tenderness.     Cervix:  No cervical motion tenderness, friability or erythema.     Comments: Speculum Exam: -Normal External Genitalia: Non tender, no apparent discharge at introitus.  -Vaginal Vault: Pink mucosa with good rugae. Small amt pooling of murky colored liquid -wet and fern prep collected -Cervix:Pink, no lesions, cysts, or polyps.  Appears closed. No active bleeding or leaking from os-GC/CT collected -Bimanual Exam: Dilation: 1 Effacement (%): 50 Station: Ballotable Exam by:: Sabas Sous, CNM  Skin:    General: Skin is warm and dry.  Neurological:     Mental Status: She is alert and oriented to person, place, and time.  Psychiatric:        Mood and Affect: Mood normal.        Behavior: Behavior normal.        Thought Content: Thought content normal.     Fetal Assessment 140 bpm, Mod Var, -Decels, +15x15Accels Toco: Q56min, palpates moderate to strong  MAU Course   Results for orders placed or performed during the hospital encounter of 02/12/20 (from the past 24 hour(s))  Wet prep, genital     Status: Abnormal   Collection Time: 02/13/20 12:42 AM  Result Value Ref Range   Yeast Wet Prep HPF POC NONE SEEN NONE SEEN   Trich, Wet Prep NONE SEEN NONE SEEN   Clue Cells Wet Prep HPF POC PRESENT (A) NONE SEEN   WBC, Wet Prep HPF POC MANY (A) NONE SEEN   Sperm NONE SEEN   Amnisure rupture of membrane (rom)not at [facility]     Status: None   Collection Time: 02/13/20  1:20 AM  Result Value Ref Range   Amnisure ROM NEGATIVE    No results found.  MDM PE Labs: Wet Prep, GC/CT EFM Start IV LR Bolus  Pain Medication Tocolytics MD Consult Assessment and Plan  19 year old G8P0102  SIUP at 29weeks Cat I FT Preterm Contractions  -Start IV and LR bolus. -Collect and hold admission labs.  -Exam performed and findings discussed. -Fern collected and negative. -Dr. Macon Large updated on patient status and findings. Advised *Collect amnisure. -Will await results and monitor.  -NST Reactive   Cherre Robins MSN, CNM 02/13/2020, 12:30 AM   Reassessment (1:44 AM)  -Contractions pattern difficult to distinguish. -Provider to bedside and patient reports contractions remain 10/10. -Offered and accepts pain medication.  Fentanyl ordered. -Nurse instructed to adjust monitor. -Will continue to observe.   Reassessment (3:03 AM) -Patient continues to have contractions despite fluid bolus. -Patient reports pain remains  the same, but required moderate touch before acknowledging provider at bedside. -Will give terbutaline for rapid cessation of contractions. -Will evaluate cervix after tocolytic.   Reassessment (3:49 AM)  -Ctx subsided with terb dosing. -Cervical exam remains the same. -Discussed usage of procardia at home for contractions that are <4/hr -Instructed to report to [facility] on Monday for repeat BMZ dosing.  -Message sent to clinical and scheduling staff. -PTLabor and Bleeding Precautions Given. -Patient verbalized understanding and without questions or concerns. -Encouraged to call or return to MAU if symptoms worsen or with the onset of new symptoms. -Discharged to home in stable condition.  Cherre Robins MSN, CNM Advanced Practice Provider, [facility]

## 2020-02-13 NOTE — MAU Note (Signed)
Pt reports to MAU stating she is having a lot of pelvic pressure as well as a constant abdominal pain. Pt reports +FM. No bleeding or LOF. This is ongoing x 1 hour.

## 2020-02-14 ENCOUNTER — Other Ambulatory Visit: Payer: Self-pay

## 2020-02-14 ENCOUNTER — Inpatient Hospital Stay (HOSPITAL_COMMUNITY)
Admission: AD | Admit: 2020-02-14 | Discharge: 2020-02-14 | Disposition: A | Discharge status: Home / Self Care | Payer: Medicaid Other | Attending: Obstetrics and Gynecology | Admitting: Obstetrics and Gynecology

## 2020-02-14 DIAGNOSIS — O4703 False labor before 37 completed weeks of gestation, third trimester: Secondary | ICD-10-CM | POA: Insufficient documentation

## 2020-02-14 DIAGNOSIS — Z3A29 29 weeks gestation of pregnancy: Secondary | ICD-10-CM | POA: Diagnosis not present

## 2020-02-14 LAB — GC/CHLAMYDIA PROBE AMP (~~LOC~~) NOT AT ARMC
Chlamydia: NEGATIVE
Comment: NEGATIVE
Comment: NORMAL
Neisseria Gonorrhea: NEGATIVE

## 2020-02-14 MED ORDER — BETAMETHASONE SOD PHOS & ACET 6 (3-3) MG/ML IJ SUSP
12.0000 mg | Freq: Once | INTRAMUSCULAR | Status: AC
  Administered 2020-02-14: 12 mg via INTRAMUSCULAR

## 2020-02-14 NOTE — MAU Note (Signed)
Pt here for second dose of betamethasone. Denies any pain or cramping . But does report some pressure. Good fetal movement felt. Has not picked up procardia yet.

## 2020-02-14 NOTE — MAU Provider Note (Signed)
S Ms. [place] Dazey is a 19 y.o. [phone] non-pregnant female who presents to MAU today for her second dose of betamethasone. She denies worsening contractions, cramping, LOF, or VB.  She is still having some very occasional, very mild contractions but they are less than 1/hr. She has not been able to get her Procardia as she's been with her daughter who is admitted in the peds unit.    O LMP 07/07/2019  Physical Exam Vitals and nursing note reviewed.  Constitutional:      Appearance: Normal appearance. She is normal weight.  Cardiovascular:     Rate and Rhythm: Normal rate.     Pulses: Normal pulses.  Pulmonary:     Effort: Pulmonary effort is normal.  Abdominal:     Palpations: Abdomen is soft.  Musculoskeletal:        General: Normal range of motion.  Skin:    General: Skin is warm and dry.     Capillary Refill: Capillary refill takes less than 2 seconds.  Neurological:     Mental Status: She is alert and oriented to person, place, and time.  Psychiatric:        Mood and Affect: Mood normal.        Behavior: Behavior normal.        Thought Content: Thought content normal.        Judgment: Judgment normal.     A G2P0102 at [redacted]w[redacted]d Betamethasone shot (2nd) Medical screening exam complete  P Discharge from MAU in stable condition Preterm labor precautions given Follow up with regular OB provider  Edd Arbour, CNM, MSN, [facility] 02/14/20 8:30 PM

## 2020-02-14 NOTE — Telephone Encounter (Signed)
-----   Message from Gerrit Heck, [place] sent at 02/13/2020  4:50 AM EDT ----- Regarding: BMZ Hello Clinical and Research officer, trade union,  This patient will arrive on Monday, without an appt, for a repeat BMZ dose.  She was instructed to come in the morning.  If she has not arrived before 12, please call.  Thanks,  Shanda Bumps, [place]

## 2020-02-14 NOTE — Telephone Encounter (Signed)
Patient was seen in the Women's and Children unit last night. She received a dose of BMZ. She was instructed to come back to [facility] for her second dose on 02/14/2020. I called patient to see if I could schedule her an appointment. Patient stated her child has been admitted to [facility] and she will not be able to come in for her second dose. I explain to patient the importance of coming in for her next dose. I ask her if she could maybe walk down to Women's and children unit to explain to them what was going on and maybe they could assist her with her next dose. Patient voice understanding.

## 2020-02-14 NOTE — Discharge Instructions (Signed)

## 2020-03-03 ENCOUNTER — Telehealth: Payer: Self-pay

## 2020-03-03 NOTE — Telephone Encounter (Signed)
LVM for pt c/b

## 2020-03-07 ENCOUNTER — Telehealth (INDEPENDENT_AMBULATORY_CARE_PROVIDER_SITE_OTHER): Payer: Medicaid Other | Admitting: Advanced Practice Midwife

## 2020-03-07 DIAGNOSIS — O09299 Supervision of pregnancy with other poor reproductive or obstetric history, unspecified trimester: Secondary | ICD-10-CM

## 2020-03-07 DIAGNOSIS — Z3A32 32 weeks gestation of pregnancy: Secondary | ICD-10-CM

## 2020-03-07 DIAGNOSIS — O4703 False labor before 37 completed weeks of gestation, third trimester: Secondary | ICD-10-CM

## 2020-03-07 DIAGNOSIS — O0990 Supervision of high risk pregnancy, unspecified, unspecified trimester: Secondary | ICD-10-CM

## 2020-03-07 DIAGNOSIS — O09293 Supervision of pregnancy with other poor reproductive or obstetric history, third trimester: Secondary | ICD-10-CM

## 2020-03-07 NOTE — Progress Notes (Signed)
OBSTETRICS PRENATAL VIRTUAL VISIT ENCOUNTER NOTE  Provider location: [facility] [place]   I connected with [place] Hollerbach on 03/07/20 at  9:15 AM EDT by MyChart Video Encounter at home and verified that I am speaking with the correct person using two identifiers.   I discussed the limitations, risks, security and privacy concerns of performing an evaluation and management service virtually and the availability of in person appointments. I also discussed with the patient that there may be a patient responsible charge related to this service. The patient expressed understanding and agreed to proceed. Subjective:  [name] is a 19 y.o. G2P0102 at [redacted]w[redacted]d being seen today for ongoing prenatal care.  She is currently monitored for the following issues for this high-risk pregnancy and has Anemia affecting pregnancy in second trimester; Supervision of other normal pregnancy, antepartum; Hx of preeclampsia, prior pregnancy, currently pregnant; Hx of postpartum hemorrhage, currently pregnant; Short interval between pregnancies complicating pregnancy, antepartum; Chlamydia trachomatis infection during pregnancy in first trimester; Current nicotine use; GBS bacteriuria; Nausea and vomiting during pregnancy prior to [redacted] weeks gestation; and Pyelonephritis affecting pregnancy in second trimester on their problem list.  Patient reports frequent episodes of cramping/contractions that resolve.   .  .   . Denies any leaking of fluid.   The following portions of the patient's history were reviewed and updated as appropriate: allergies, current medications, past family history, past medical history, past social history, past surgical history and problem list.   Objective:  There were no vitals filed for this visit. BP 118/68 on home cuff today  Fetal Status:           General:  Alert, oriented and cooperative. Patient is in no acute distress.  Respiratory: Normal respiratory effort,  no problems with respiration noted  Mental Status: Normal mood and affect. Normal behavior. Normal judgment and thought content.  Rest of physical exam deferred due to type of encounter  Imaging: No results found.  Assessment and Plan:  Pregnancy: G2P0102 at 106w2d 1. Threatened premature labor in third trimester --Pt had MAU visits for PTL, cervix 1/50/-3 but with frequent contractions. BMZ given x 2 in MAU, pt was not admitted. --Contractions irregular today but there are episodes with frequent painful close together contractions that resolve eventually but are not improved with Procardia Rx that was given. --No signs of active labor during today's visit but precautions given on when to seek care in MAU --F/U in office in 1 week   2. Hx of preeclampsia, prior pregnancy, currently pregnant --No HTN this pregnancy, on BASA. --BP 118/68 on home cuff today  3. Supervision of high risk pregnancy, antepartum --Pregnancy is low risk except for recent MAU visits for contractions.  No HTN this pregnancy. Cervix was 1/50/-2.  No regular contractions, but irregular cramping at today's visit. --If no HTN this pregnancy, and no preterm labor/delivery, there are no contraindications to waterbirth.  Pt has class scheduled Thursday.  Needs consent. --F/U in 1 week in office due to frequent contractions.   --Needs waterbirth consent  4. [redacted] weeks gestation of pregnancy  Preterm labor symptoms and general obstetric precautions including but not limited to vaginal bleeding, contractions, leaking of fluid and fetal movement were reviewed in detail with the patient. I discussed the assessment and treatment plan with the patient. The patient was provided an opportunity to ask questions and all were answered. The patient agreed with the plan and demonstrated an understanding of the instructions. The patient was  advised to call back or seek an in-person office evaluation/go to MAU at [facility]  for any urgent or concerning symptoms. Please refer to After Visit Summary for other counseling recommendations.   I provided 7 minutes of face-to-face time during this encounter.  Return in about 1 week (around 03/14/2020).  No future appointments.  Sharen Counter, CNM [facility], [facility]

## 2020-03-10 ENCOUNTER — Telehealth: Payer: Self-pay

## 2020-03-10 NOTE — Telephone Encounter (Signed)
Patient called stating she has been bleeding for 2-3 days now. No recent trauma or sexual contact. Patient reports baby is moving. I have advised patient to report to Women's and Childrens unit to be seen.

## 2020-03-11 ENCOUNTER — Encounter (HOSPITAL_COMMUNITY): Payer: Self-pay | Admitting: Obstetrics and Gynecology

## 2020-03-11 ENCOUNTER — Other Ambulatory Visit: Payer: Self-pay

## 2020-03-11 ENCOUNTER — Inpatient Hospital Stay (HOSPITAL_COMMUNITY)
Admission: AD | Admit: 2020-03-11 | Discharge: 2020-03-11 | Disposition: A | Discharge status: Home / Self Care | Payer: Medicaid Other | Attending: Obstetrics and Gynecology | Admitting: Obstetrics and Gynecology

## 2020-03-11 DIAGNOSIS — Z7982 Long term (current) use of aspirin: Secondary | ICD-10-CM | POA: Diagnosis not present

## 2020-03-11 DIAGNOSIS — Z3A32 32 weeks gestation of pregnancy: Secondary | ICD-10-CM

## 2020-03-11 DIAGNOSIS — J45909 Unspecified asthma, uncomplicated: Secondary | ICD-10-CM | POA: Insufficient documentation

## 2020-03-11 DIAGNOSIS — Z0371 Encounter for suspected problem with amniotic cavity and membrane ruled out: Secondary | ICD-10-CM | POA: Diagnosis not present

## 2020-03-11 DIAGNOSIS — O98813 Other maternal infectious and parasitic diseases complicating pregnancy, third trimester: Secondary | ICD-10-CM

## 2020-03-11 DIAGNOSIS — B373 Candidiasis of vulva and vagina: Secondary | ICD-10-CM | POA: Diagnosis not present

## 2020-03-11 DIAGNOSIS — O99513 Diseases of the respiratory system complicating pregnancy, third trimester: Secondary | ICD-10-CM | POA: Insufficient documentation

## 2020-03-11 DIAGNOSIS — B3731 Acute candidiasis of vulva and vagina: Secondary | ICD-10-CM

## 2020-03-11 DIAGNOSIS — O26893 Other specified pregnancy related conditions, third trimester: Secondary | ICD-10-CM | POA: Diagnosis present

## 2020-03-11 LAB — URINALYSIS, ROUTINE W REFLEX MICROSCOPIC
Bilirubin Urine: NEGATIVE
Glucose, UA: NEGATIVE mg/dL
Ketones, ur: NEGATIVE mg/dL
Nitrite: NEGATIVE
Protein, ur: NEGATIVE mg/dL
Specific Gravity, Urine: 1.005 (ref 1.005–1.030)
WBC, UA: >50 WBC/hpf — ABNORMAL HIGH (ref 0–5)
pH: 7 (ref 5.0–8.0)

## 2020-03-11 LAB — WET PREP, GENITAL
Clue Cells Wet Prep HPF POC: NONE SEEN
Sperm: NONE SEEN
Trich, Wet Prep: NONE SEEN

## 2020-03-11 LAB — POCT FERN TEST: POCT Fern Test: NEGATIVE

## 2020-03-11 MED ORDER — TRIAMCINOLONE ACETONIDE 0.1 % EX CREA
TOPICAL_CREAM | Freq: Once | CUTANEOUS | Status: AC
  Administered 2020-03-11: 1 via TOPICAL
  Filled 2020-03-11: qty 15

## 2020-03-11 MED ORDER — TERCONAZOLE 0.4 % VA CREA
1.0000 | TOPICAL_CREAM | Freq: Every day | VAGINAL | 0 refills | Status: DC
Start: 2020-03-11 — End: 2020-03-27

## 2020-03-11 NOTE — MAU Note (Signed)
Pt stated she had some vag bleeding earlier this week. Stared having cramping and pain last night that woke her up. Has been hurting ever since.  Pain is more constant in lower  Pelvic are. Good fetal movement felt. Pt also reports she is leaking a yellow fluid for about 1 hr.

## 2020-03-11 NOTE — Discharge Instructions (Signed)
Vaginal Yeast Infection, Adult  Vaginal yeast infection is a condition that causes vaginal discharge as well as soreness, swelling, and redness (inflammation) of the vagina. This is a common condition. Some women get this infection frequently. What are the causes? This condition is caused by a change in the normal balance of the yeast (candida) and bacteria that live in the vagina. This change causes an overgrowth of yeast, which causes the inflammation. What increases the risk? The condition is more likely to develop in women who:  Take antibiotic medicines.  Have diabetes.  Take birth control pills.  Are pregnant.  Douche often.  Have a weak body defense system (immune system).  Have been taking steroid medicines for a long time.  Frequently wear tight clothing. What are the signs or symptoms? Symptoms of this condition include:  White, thick, creamy vaginal discharge.  Swelling, itching, redness, and irritation of the vagina. The lips of the vagina (vulva) may be affected as well.  Pain or a burning feeling while urinating.  Pain during sex. How is this diagnosed? This condition is diagnosed based on:  Your medical history.  A physical exam.  A pelvic exam. Your health care provider will examine a sample of your vaginal discharge under a microscope. Your health care provider may send this sample for testing to confirm the diagnosis. How is this treated? This condition is treated with medicine. Medicines may be over-the-counter or prescription. You may be told to use one or more of the following:  Medicine that is taken by mouth (orally).  Medicine that is applied as a cream (topically).  Medicine that is inserted directly into the vagina (suppository). Follow these instructions at home:  Lifestyle  Do not have sex until your health care provider approves. Tell your sex partner that you have a yeast infection. That person should go to his or her health care  provider and ask if they should also be treated.  Do not wear tight clothes, such as pantyhose or tight pants.  Wear breathable cotton underwear. General instructions  Take or apply over-the-counter and prescription medicines only as told by your health care provider.  Eat more yogurt. This may help to keep your yeast infection from returning.  Do not use tampons until your health care provider approves.  Try taking a sitz bath to help with discomfort. This is a warm water bath that is taken while you are sitting down. The water should only come up to your hips and should cover your buttocks. Do this 3-4 times per day or as told by your health care provider.  Do not douche.  If you have diabetes, keep your blood sugar levels under control.  Keep all follow-up visits as told by your health care provider. This is important. Contact a health care provider if:  You have a fever.  Your symptoms go away and then return.  Your symptoms do not get better with treatment.  Your symptoms get worse.  You have new symptoms.  You develop blisters in or around your vagina.  You have blood coming from your vagina and it is not your menstrual period.  You develop pain in your abdomen. Summary  Vaginal yeast infection is a condition that causes discharge as well as soreness, swelling, and redness (inflammation) of the vagina.  This condition is treated with medicine. Medicines may be over-the-counter or prescription.  Take or apply over-the-counter and prescription medicines only as told by your health care provider.  Do not douche.   Do not have sex or use tampons until your health care provider approves.  Contact a health care provider if your symptoms do not get better with treatment or your symptoms go away and then return. This information is not intended to replace advice given to you by your health care provider. Make sure you discuss any questions you have with your health care  provider. Document Revised: 12/18/2018 Document Reviewed: 10/06/2017 Elsevier Patient Education  2020 Elsevier Inc.  

## 2020-03-11 NOTE — MAU Provider Note (Signed)
History     CSN: [id number]  Arrival date and time: 03/11/20 1550   First Provider Initiated Contact with Patient 03/11/20 1640      Chief Complaint  Patient presents with  . Rupture of Membranes  . Contractions   [place] Caslin is a 19 y.o. D5H2992 at [redacted]w[redacted]d who presents with complaints of vaginal discharge & abdominal pain. Reports gush of clear yellow fluid earlier today & feels like her underwear has been wet. Has had vaginal itching & irritation since yesterday. Also reports lower abdominal cramping. Can't tell how frequently. Denies fever, dysuria, n/v/d, vaginal bleeding. Last intercourse was 2 days ago. Good fetal movement.   Location: pelvic Quality: pressure Severity: 10/10 on pain scale Duration: 1 day Timing: intermittent Modifying factors: none Associated signs and symptoms: vaginal discharge    OB History    Gravida  2   Para  1   Term  0   Preterm  1   AB  0   Living  2     SAB  0   TAB  0   Ectopic  0   Multiple  1   Live Births  2           Past Medical History:  Diagnosis Date  . Asthma   . Migraines   . Pregnancy induced hypertension     Past Surgical History:  Procedure Laterality Date  . WISDOM TOOTH EXTRACTION      Family History  Problem Relation Age of Onset  . Hypertension Mother   . Asthma Brother   . Hypertension Brother   . Diabetes Brother   . Hypertension Maternal Aunt   . Diabetes Maternal Aunt   . Autism spectrum disorder Maternal Aunt   . Hypertension Maternal Grandmother   . Aneurysm Maternal Uncle     Social History   Tobacco Use  . Smoking status: Never Smoker  . Smokeless tobacco: Never Used  Vaping Use  . Vaping Use: Former  Substance Use Topics  . Alcohol use: Never    Alcohol/week: 0.0 standard drinks  . Drug use: Not Currently    Types: Marijuana    Comment: NOT SMOKED DURING PREG    Allergies:  Allergies  Allergen Reactions  . Tomato Other (See Comments)    Makes asthma worse     Medications Prior to Admission  Medication Sig Dispense Refill Last Dose  . aspirin EC 81 MG tablet Take 1 tablet (81 mg total) by mouth daily. Take after 12 weeks for prevention of preeclampsia later in pregnancy. Start taking on May 18th 300 tablet 0 03/11/2020 at Unknown time  . NIFEdipine (PROCARDIA) 10 MG capsule Take 1 capsule (10 mg total) by mouth every 6 (six) hours as needed. For contractions. 30 capsule 1 03/11/2020 at Unknown time  . Prenatal Vit-Fe Fumarate-FA (MULTIVITAMIN-PRENATAL) 27-0.8 MG TABS tablet Take 1 tablet by mouth daily at 12 noon.   03/11/2020 at Unknown time  . famotidine (PEPCID) 20 MG tablet Take 1 tablet (20 mg total) by mouth 2 (two) times daily. 60 tablet 0     Review of Systems  Constitutional: Negative.   Gastrointestinal: Positive for abdominal pain. Negative for constipation, diarrhea, nausea and vomiting.  Genitourinary: Positive for vaginal discharge. Negative for dysuria, flank pain, frequency, genital sores and vaginal bleeding.   Physical Exam   Blood pressure 116/70, pulse 81, temperature 98.2 F (36.8 C), resp. rate 18, height 5\' 4"  (1.626 m), weight 73 kg, last menstrual period 07/07/2019, currently  breastfeeding.  Physical Exam Vitals and nursing note reviewed. Exam conducted with a chaperone present.  Constitutional:      General: She is not in acute distress.    Appearance: Normal appearance.  HENT:     Head: Normocephalic and atraumatic.  Pulmonary:     Effort: Pulmonary effort is normal. No respiratory distress.  Abdominal:     Tenderness: There is no abdominal tenderness.     Comments: Gravid uterus  Genitourinary:    Exam position: Lithotomy position.     Labia:        Right: Tenderness present.        Left: Tenderness present.      Vagina: Vaginal discharge, erythema and tenderness present. No bleeding.     Comments: Bilateral labia swollen & erythematous with excoriation.  Moderate amount of white clumpy discharge adherent  to vaginal walls & cervix.   Dilation: Closed Effacement (%): 50 Cervical Position: Posterior Exam by:: E. Mauricio Dahlen,NP  Neurological:     Mental Status: She is alert.      Fetal Tracing:  Baseline: 140 Variability: moderate Accelerations: 15x15 Decelerations: none  Toco: none   MAU Course  Procedures Results for orders placed or performed during the hospital encounter of 03/11/20 (from the past 24 hour(s))  POCT fern test     Status: Normal   Collection Time: 03/11/20  5:01 PM  Result Value Ref Range   POCT Fern Test Negative = intact amniotic membranes   Wet prep, genital     Status: Abnormal   Collection Time: 03/11/20  5:02 PM   Specimen: PATH Cytology Cervicovaginal Ancillary Only  Result Value Ref Range   Yeast Wet Prep HPF POC PRESENT (A) NONE SEEN   Trich, Wet Prep NONE SEEN NONE SEEN   Clue Cells Wet Prep HPF POC NONE SEEN NONE SEEN   WBC, Wet Prep HPF POC MANY (A) NONE SEEN   Sperm NONE SEEN   Urinalysis, Routine w reflex microscopic Urine, Clean Catch     Status: Abnormal   Collection Time: 03/11/20  5:14 PM  Result Value Ref Range   Color, Urine YELLOW YELLOW   APPearance HAZY (A) CLEAR   Specific Gravity, Urine 1.005 1.005 - 1.030   pH 7.0 5.0 - 8.0   Glucose, UA NEGATIVE NEGATIVE mg/dL   Hgb urine dipstick SMALL (A) NEGATIVE   Bilirubin Urine NEGATIVE NEGATIVE   Ketones, ur NEGATIVE NEGATIVE mg/dL   Protein, ur NEGATIVE NEGATIVE mg/dL   Nitrite NEGATIVE NEGATIVE   Leukocytes,Ua LARGE (A) NEGATIVE   RBC / HPF 6-10 0 - 5 RBC/hpf   WBC, UA >50 (H) 0 - 5 WBC/hpf   Bacteria, UA RARE (A) NONE SEEN   Squamous Epithelial / LPF 6-10 0 - 5   Mucus PRESENT    Budding Yeast PRESENT    Non Squamous Epithelial 0-5 (A) NONE SEEN    MDM Reactive fetal tracing. No contractions on monitor. Abdomen soft & non tender. Cervix closed/thick. Patient with hx of pyelo this pregnancy. Currently no urinary complaints, flank pain, or fever. U/a with leuks, will get  urine culture.   Vaginal yeast infection evident on exam & wet prep. No evidence of PROM (no pooling & fern negative). Topical triamcinolone given in MAU to apply to vulva. Will d/c home with rx terazol. GC/CT pending.   Assessment and Plan   1. Vaginal yeast infection  -positive on wet prep & exam -Rx terazol -no intercourse until treatment completed -continue to use triamcinolone  externally until symptoms improve -GC/CT pending  2. Encounter for suspected PROM, with rupture of membranes not found  -no pooling & fern negative -reviewed PTL precautions  3. [redacted] weeks gestation of pregnancy      Judeth Horn 03/11/2020, 5:15 PM

## 2020-03-12 LAB — CULTURE, OB URINE

## 2020-03-13 LAB — GC/CHLAMYDIA PROBE AMP (~~LOC~~) NOT AT ARMC
Chlamydia: NEGATIVE
Comment: NEGATIVE
Comment: NORMAL
Neisseria Gonorrhea: NEGATIVE

## 2020-03-21 ENCOUNTER — Other Ambulatory Visit: Payer: Self-pay

## 2020-03-21 ENCOUNTER — Encounter: Payer: Self-pay | Admitting: Obstetrics

## 2020-03-21 ENCOUNTER — Ambulatory Visit (INDEPENDENT_AMBULATORY_CARE_PROVIDER_SITE_OTHER): Payer: Medicaid Other | Admitting: Advanced Practice Midwife

## 2020-03-21 VITALS — BP 109/63 | HR 92 | Wt 161.4 lb

## 2020-03-21 DIAGNOSIS — Z3A34 34 weeks gestation of pregnancy: Secondary | ICD-10-CM

## 2020-03-21 DIAGNOSIS — Z3480 Encounter for supervision of other normal pregnancy, unspecified trimester: Secondary | ICD-10-CM

## 2020-03-21 DIAGNOSIS — R102 Pelvic and perineal pain: Secondary | ICD-10-CM

## 2020-03-21 DIAGNOSIS — O26893 Other specified pregnancy related conditions, third trimester: Secondary | ICD-10-CM

## 2020-03-21 MED ORDER — COMFORT FIT MATERNITY SUPP MED MISC: 1.0000 | Freq: Every day | 0 refills | Status: DC

## 2020-03-21 NOTE — Patient Instructions (Signed)
Third Trimester of Pregnancy The third trimester is from week 28 through week 40 (months 7 through 9). The third trimester is a time when the unborn baby (fetus) is growing rapidly. At the end of the ninth month, the fetus is about 20 inches in length and weighs 6-10 pounds. Body changes during your third trimester Your body will continue to go through many changes during pregnancy. The changes vary from woman to woman. During the third trimester:  Your weight will continue to increase. You can expect to gain 25-35 pounds (11-16 kg) by the end of the pregnancy.  You may begin to get stretch marks on your hips, abdomen, and breasts.  You may urinate more often because the fetus is moving lower into your pelvis and pressing on your bladder.  You may develop or continue to have heartburn. This is caused by increased hormones that slow down muscles in the digestive tract.  You may develop or continue to have constipation because increased hormones slow digestion and cause the muscles that push waste through your intestines to relax.  You may develop hemorrhoids. These are swollen veins (varicose veins) in the rectum that can itch or be painful.  You may develop swollen, bulging veins (varicose veins) in your legs.  You may have increased body aches in the pelvis, back, or thighs. This is due to weight gain and increased hormones that are relaxing your joints.  You may have changes in your hair. These can include thickening of your hair, rapid growth, and changes in texture. Some women also have hair loss during or after pregnancy, or hair that feels dry or thin. Your hair will most likely return to normal after your baby is born.  Your breasts will continue to grow and they will continue to become tender. A yellow fluid (colostrum) may leak from your breasts. This is the first milk you are producing for your baby.  Your belly button may stick out.  You may notice more swelling in your hands,  face, or ankles.  You may have increased tingling or numbness in your hands, arms, and legs. The skin on your belly may also feel numb.  You may feel short of breath because of your expanding uterus.  You may have more problems sleeping. This can be caused by the size of your belly, increased need to urinate, and an increase in your body's metabolism.  You may notice the fetus "dropping," or moving lower in your abdomen (lightening).  You may have increased vaginal discharge.  You may notice your joints feel loose and you may have pain around your pelvic bone. What to expect at prenatal visits You will have prenatal exams every 2 weeks until week 36. Then you will have weekly prenatal exams. During a routine prenatal visit:  You will be weighed to make sure you and the baby are growing normally.  Your blood pressure will be taken.  Your abdomen will be measured to track your baby's growth.  The fetal heartbeat will be listened to.  Any test results from the previous visit will be discussed.  You may have a cervical check near your due date to see if your cervix has softened or thinned (effaced).  You will be tested for Group B streptococcus. This happens between 35 and 37 weeks. Your health care provider may ask you:  What your birth plan is.  How you are feeling.  If you are feeling the baby move.  If you have had any abnormal   symptoms, such as leaking fluid, bleeding, severe headaches, or abdominal cramping.  If you are using any tobacco products, including cigarettes, chewing tobacco, and electronic cigarettes.  If you have any questions. Other tests or screenings that may be performed during your third trimester include:  Blood tests that check for low iron levels (anemia).  Fetal testing to check the health, activity level, and growth of the fetus. Testing is done if you have certain medical conditions or if there are problems during the pregnancy.  Nonstress test  (NST). This test checks the health of your baby to make sure there are no signs of problems, such as the baby not getting enough oxygen. During this test, a belt is placed around your belly. The baby is made to move, and its heart rate is monitored during movement. What is false labor? False labor is a condition in which you feel small, irregular tightenings of the muscles in the womb (contractions) that usually go away with rest, changing position, or drinking water. These are called Braxton Hicks contractions. Contractions may last for hours, days, or even weeks before true labor sets in. If contractions come at regular intervals, become more frequent, increase in intensity, or become painful, you should see your health care provider. What are the signs of labor?  Abdominal cramps.  Regular contractions that start at 10 minutes apart and become stronger and more frequent with time.  Contractions that start on the top of the uterus and spread down to the lower abdomen and back.  Increased pelvic pressure and dull back pain.  A watery or bloody mucus discharge that comes from the vagina.  Leaking of amniotic fluid. This is also known as your "water breaking." It could be a slow trickle or a gush. Let your health care provider know if it has a color or strange odor. If you have any of these signs, call your health care provider right away, even if it is before your due date. Follow these instructions at home: Medicines  Follow your health care provider's instructions regarding medicine use. Specific medicines may be either safe or unsafe to take during pregnancy.  Take a prenatal vitamin that contains at least 600 micrograms (mcg) of folic acid.  If you develop constipation, try taking a stool softener if your health care provider approves. Eating and drinking   Eat a balanced diet that includes fresh fruits and vegetables, whole grains, good sources of protein such as meat, eggs, or tofu,  and low-fat dairy. Your health care provider will help you determine the amount of weight gain that is right for you.  Avoid raw meat and uncooked cheese. These carry germs that can cause birth defects in the baby.  If you have low calcium intake from food, talk to your health care provider about whether you should take a daily calcium supplement.  Eat four or five small meals rather than three large meals a day.  Limit foods that are high in fat and processed sugars, such as fried and sweet foods.  To prevent constipation: ? Drink enough fluid to keep your urine clear or pale yellow. ? Eat foods that are high in fiber, such as fresh fruits and vegetables, whole grains, and beans. Activity  Exercise only as directed by your health care provider. Most women can continue their usual exercise routine during pregnancy. Try to exercise for 30 minutes at least 5 days a week. Stop exercising if you experience uterine contractions.  Avoid heavy lifting.  Do   not exercise in extreme heat or humidity, or at high altitudes.  Wear low-heel, comfortable shoes.  Practice good posture.  You may continue to have sex unless your health care provider tells you otherwise. Relieving pain and discomfort  Take frequent breaks and rest with your legs elevated if you have leg cramps or low back pain.  Take warm sitz baths to soothe any pain or discomfort caused by hemorrhoids. Use hemorrhoid cream if your health care provider approves.  Wear a good support bra to prevent discomfort from breast tenderness.  If you develop varicose veins: ? Wear support pantyhose or compression stockings as told by your healthcare provider. ? Elevate your feet for 15 minutes, 3-4 times a day. Prenatal care  Write down your questions. Take them to your prenatal visits.  Keep all your prenatal visits as told by your health care provider. This is important. Safety  Wear your seat belt at all times when driving.  Make  a list of emergency phone numbers, including numbers for family, friends, the hospital, and police and fire departments. General instructions  Avoid cat litter boxes and soil used by cats. These carry germs that can cause birth defects in the baby. If you have a cat, ask someone to clean the litter box for you.  Do not travel far distances unless it is absolutely necessary and only with the approval of your health care provider.  Do not use hot tubs, steam rooms, or saunas.  Do not drink alcohol.  Do not use any products that contain nicotine or tobacco, such as cigarettes and e-cigarettes. If you need help quitting, ask your health care provider.  Do not use any medicinal herbs or unprescribed drugs. These chemicals affect the formation and growth of the baby.  Do not douche or use tampons or scented sanitary pads.  Do not cross your legs for long periods of time.  To prepare for the arrival of your baby: ? Take prenatal classes to understand, practice, and ask questions about labor and delivery. ? Make a trial run to the hospital. ? Visit the hospital and tour the maternity area. ? Arrange for maternity or paternity leave through employers. ? Arrange for family and friends to take care of pets while you are in the hospital. ? Purchase a rear-facing car seat and make sure you know how to install it in your car. ? Pack your hospital bag. ? Prepare the baby's nursery. Make sure to remove all pillows and stuffed animals from the baby's crib to prevent suffocation.  Visit your dentist if you have not gone during your pregnancy. Use a soft toothbrush to brush your teeth and be gentle when you floss. Contact a health care provider if:  You are unsure if you are in labor or if your water has broken.  You become dizzy.  You have mild pelvic cramps, pelvic pressure, or nagging pain in your abdominal area.  You have lower back pain.  You have persistent nausea, vomiting, or  diarrhea.  You have an unusual or bad smelling vaginal discharge.  You have pain when you urinate. Get help right away if:  Your water breaks before 37 weeks.  You have regular contractions less than 5 minutes apart before 37 weeks.  You have a fever.  You are leaking fluid from your vagina.  You have spotting or bleeding from your vagina.  You have severe abdominal pain or cramping.  You have rapid weight loss or weight gain.  You have   shortness of breath with chest pain.  You notice sudden or extreme swelling of your face, hands, ankles, feet, or legs.  Your baby makes fewer than 10 movements in 2 hours.  You have severe headaches that do not go away when you take medicine.  You have vision changes. Summary  The third trimester is from week 28 through week 40, months 7 through 9. The third trimester is a time when the unborn baby (fetus) is growing rapidly.  During the third trimester, your discomfort may increase as you and your baby continue to gain weight. You may have abdominal, leg, and back pain, sleeping problems, and an increased need to urinate.  During the third trimester your breasts will keep growing and they will continue to become tender. A yellow fluid (colostrum) may leak from your breasts. This is the first milk you are producing for your baby.  False labor is a condition in which you feel small, irregular tightenings of the muscles in the womb (contractions) that eventually go away. These are called Braxton Hicks contractions. Contractions may last for hours, days, or even weeks before true labor sets in.  Signs of labor can include: abdominal cramps; regular contractions that start at 10 minutes apart and become stronger and more frequent with time; watery or bloody mucus discharge that comes from the vagina; increased pelvic pressure and dull back pain; and leaking of amniotic fluid. This information is not intended to replace advice given to you by your  health care provider. Make sure you discuss any questions you have with your health care provider. Document Revised: 09/10/2018 Document Reviewed: 06/25/2016 Elsevier Patient Education  2020 [employer]  

## 2020-03-21 NOTE — Progress Notes (Signed)
   PRENATAL VISIT NOTE  Subjective:  [name] is a 19 y.o. G2P0102 at [redacted]w[redacted]d being seen today for ongoing prenatal care.  She is currently monitored for the following issues for this low-risk pregnancy and has Supervision of other normal pregnancy, antepartum; Hx of preeclampsia, prior pregnancy, currently pregnant; Hx of postpartum hemorrhage, currently pregnant; Short interval between pregnancies complicating pregnancy, antepartum; Chlamydia trachomatis infection during pregnancy in first trimester; Current nicotine use; Nausea and vomiting during pregnancy prior to [redacted] weeks gestation; and Pyelonephritis affecting pregnancy in second trimester on their problem list.  Patient reports pelvic pain.  Contractions: Not present. Vag. Bleeding: Scant.  Movement: Present. Denies leaking of fluid.   The following portions of the patient's history were reviewed and updated as appropriate: allergies, current medications, past family history, past medical history, past social history, past surgical history and problem list.   Objective:   Vitals:   03/21/20 0915  BP: 109/63  Pulse: 92  Weight: 161 lb 6.4 oz (73.2 kg)    Fetal Status: Fetal Heart Rate (bpm): 135   Movement: Present     General:  Alert, oriented and cooperative. Patient is in no acute distress.  Skin: Skin is warm and dry. No rash noted.   Cardiovascular: Normal heart rate noted  Respiratory: Normal respiratory effort, no problems with respiration noted  Abdomen: Soft, gravid, appropriate for gestational age.  Pain/Pressure: Absent     Pelvic: Cervical exam deferred        Extremities: Normal range of motion.  Edema: None  Mental Status: Normal mood and affect. Normal behavior. Normal judgment and thought content.   Assessment and Plan:  Pregnancy: G2P0102 at [redacted]w[redacted]d 1. Supervision of other normal pregnancy, antepartum --Anticipatory guidance about next visits/weeks of pregnancy given. --Next visit in 2 weeks in office for  GBS  2. [redacted] weeks gestation of pregnancy   3. Pelvic pain affecting pregnancy in third trimester, antepartum --Pain with walking, movement --Rest/ice/heat/warm bath/Tylenol/pregnancy support belt  - Elastic Bandages & Supports (COMFORT FIT MATERNITY SUPP MED) MISC; 1 Device by Does not apply route daily.  Dispense: 1 each; Refill: 0  Preterm labor symptoms and general obstetric precautions including but not limited to vaginal bleeding, contractions, leaking of fluid and fetal movement were reviewed in detail with the patient. Please refer to After Visit Summary for other counseling recommendations.   Return in about 2 weeks (around 04/04/2020).  Future Appointments  Date Time Provider Department Center  04/04/2020  8:55 AM [place]Kirby, Wilmer Floor, CNM [facility] None    Sharen Counter, CNM

## 2020-03-27 ENCOUNTER — Encounter (HOSPITAL_COMMUNITY): Payer: Self-pay | Admitting: Obstetrics and Gynecology

## 2020-03-27 ENCOUNTER — Other Ambulatory Visit: Payer: Self-pay

## 2020-03-27 ENCOUNTER — Inpatient Hospital Stay (HOSPITAL_COMMUNITY)
Admission: AD | Admit: 2020-03-27 | Discharge: 2020-03-27 | Disposition: A | Discharge status: Home / Self Care | Payer: Medicaid Other | Attending: Obstetrics and Gynecology | Admitting: Obstetrics and Gynecology

## 2020-03-27 DIAGNOSIS — Z3A35 35 weeks gestation of pregnancy: Secondary | ICD-10-CM | POA: Diagnosis not present

## 2020-03-27 DIAGNOSIS — O479 False labor, unspecified: Secondary | ICD-10-CM

## 2020-03-27 DIAGNOSIS — O4703 False labor before 37 completed weeks of gestation, third trimester: Secondary | ICD-10-CM

## 2020-03-27 LAB — URINALYSIS, ROUTINE W REFLEX MICROSCOPIC
Bilirubin Urine: NEGATIVE
Glucose, UA: NEGATIVE mg/dL
Hgb urine dipstick: NEGATIVE
Ketones, ur: NEGATIVE mg/dL
Nitrite: NEGATIVE
Protein, ur: NEGATIVE mg/dL
Specific Gravity, Urine: 1.009 (ref 1.005–1.030)
WBC, UA: >50 WBC/hpf — ABNORMAL HIGH (ref 0–5)
pH: 7 (ref 5.0–8.0)

## 2020-03-27 NOTE — MAU Note (Signed)
...  [name] is a 19 y.o. at [redacted]w[redacted]d here in MAU reporting: CTX that have been increasing in intensity since 2351 last night. She reports they were initially 4 minutes apart and are now 16 minutes apart. She reports she has not felt the baby move since 2351 last night when the CTX began. No VB or LOF.

## 2020-03-27 NOTE — MAU Provider Note (Signed)
History     CSN: [id number]  Arrival date and time: 03/27/20 0127   First Provider Initiated Contact with Patient 03/27/20 0210      Chief Complaint  Patient presents with  . Contractions   HPI [name] is a 19 y.o. G2P0102 at [redacted]w[redacted]d who presents with contractions. Symptoms started around 2315. Initially reports contractions were every 4 minutes but states more recently they are every 16 minutes. Denies n/v, dysuria, vaginal bleeding, or LOF. Good fetal movement.  Had twins at 36 wks last year - states she was induced.   Location: abdomen Quality: contractions Severity: 10/10 on pain scale Duration: 3 hours Timing: every 4-16 minutes Modifying factors: none Associated signs and symptoms: none  OB History    Gravida  2   Para  1   Term  0   Preterm  1   AB  0   Living  2     SAB  0   TAB  0   Ectopic  0   Multiple  1   Live Births  2           Past Medical History:  Diagnosis Date  . Asthma   . Migraines   . Pregnancy induced hypertension     Past Surgical History:  Procedure Laterality Date  . WISDOM TOOTH EXTRACTION      Family History  Problem Relation Age of Onset  . Hypertension Mother   . Asthma Brother   . Hypertension Brother   . Diabetes Brother   . Hypertension Maternal Aunt   . Diabetes Maternal Aunt   . Autism spectrum disorder Maternal Aunt   . Hypertension Maternal Grandmother   . Aneurysm Maternal Uncle     Social History   Tobacco Use  . Smoking status: Never Smoker  . Smokeless tobacco: Never Used  Vaping Use  . Vaping Use: Former  Substance Use Topics  . Alcohol use: Never    Alcohol/week: 0.0 standard drinks  . Drug use: Not Currently    Types: Marijuana    Comment: NOT SMOKED DURING PREG    Allergies:  Allergies  Allergen Reactions  . Tomato Other (See Comments)    Makes asthma worse    Medications Prior to Admission  Medication Sig Dispense Refill Last Dose  . aspirin EC 81 MG tablet  Take 1 tablet (81 mg total) by mouth daily. Take after 12 weeks for prevention of preeclampsia later in pregnancy. Start taking on May 18th 300 tablet 0 03/26/2020 at Unknown time  . NIFEdipine (PROCARDIA) 10 MG capsule Take 1 capsule (10 mg total) by mouth every 6 (six) hours as needed. For contractions. 30 capsule 1 03/26/2020 at 2315  . Prenatal Vit-Fe Fumarate-FA (MULTIVITAMIN-PRENATAL) 27-0.8 MG TABS tablet Take 1 tablet by mouth daily at 12 noon.   03/26/2020 at Unknown time  . Elastic Bandages & Supports (COMFORT FIT MATERNITY SUPP MED) MISC 1 Device by Does not apply route daily. 1 each 0   . famotidine (PEPCID) 20 MG tablet Take 1 tablet (20 mg total) by mouth 2 (two) times daily. 60 tablet 0   . terconazole (TERAZOL 7) 0.4 % vaginal cream Place 1 applicator vaginally at bedtime. Use for seven days (Patient not taking: Reported on 03/21/2020) 45 g 0     Review of Systems  Constitutional: Negative.   Gastrointestinal: Positive for abdominal pain.  Genitourinary: Negative.    Physical Exam   Blood pressure 113/64, pulse 99, temperature 98.5 F (36.9 C),  temperature source Oral, resp. rate 17, height 5\' 4"  (1.626 m), weight 72.9 kg, last menstrual period 07/07/2019, SpO2 100 %, currently breastfeeding.  Physical Exam Vitals and nursing note reviewed. Exam conducted with a chaperone present.  Constitutional:      Appearance: Normal appearance.  Pulmonary:     Effort: Pulmonary effort is normal. No respiratory distress.  Abdominal:     Comments: Gravid uterus  Genitourinary:    Comments: Dilation: 2.5 Effacement (%): 60 Station: -3 Presentation: Vertex Exam by:: [email or web address], NP  Neurological:     Mental Status: She is alert.      Fetal Tracing:  Baseline: 140 Variability: moderate Accelerations: 15x15 Decelerations: none  Toco: Q2-4 minutes   MAU Course  Procedures Results for orders placed or performed during the hospital encounter of 03/27/20 (from the past  24 hour(s))  Urinalysis, Routine w reflex microscopic Urine, Clean Catch     Status: Abnormal   Collection Time: 03/27/20  2:51 AM  Result Value Ref Range   Color, Urine YELLOW YELLOW   APPearance CLOUDY (A) CLEAR   Specific Gravity, Urine 1.009 1.005 - 1.030   pH 7.0 5.0 - 8.0   Glucose, UA NEGATIVE NEGATIVE mg/dL   Hgb urine dipstick NEGATIVE NEGATIVE   Bilirubin Urine NEGATIVE NEGATIVE   Ketones, ur NEGATIVE NEGATIVE mg/dL   Protein, ur NEGATIVE NEGATIVE mg/dL   Nitrite NEGATIVE NEGATIVE   Leukocytes,Ua LARGE (A) NEGATIVE   RBC / HPF 6-10 0 - 5 RBC/hpf   WBC, UA >50 (H) 0 - 5 WBC/hpf   Bacteria, UA FEW (A) NONE SEEN   Squamous Epithelial / LPF 6-10 0 - 5    MDM Contractions spaced out while in MAU with oral fluids & rest. Cervix unchanged. Patient ok to be discharged home.   Assessment and Plan   1. Braxton Hick's contraction   2. [redacted] weeks gestation of pregnancy    - Reviewed reasons to return to MAU  03/29/20 03/27/2020, 5:05 AM

## 2020-03-27 NOTE — Discharge Instructions (Signed)
Braxton Hicks Contractions Contractions of the uterus can occur throughout pregnancy, but they are not always a sign that you are in labor. You may have practice contractions called Braxton Hicks contractions. These false labor contractions are sometimes confused with true labor. What are Braxton Hicks contractions? Braxton Hicks contractions are tightening movements that occur in the muscles of the uterus before labor. Unlike true labor contractions, these contractions do not result in opening (dilation) and thinning of the cervix. Toward the end of pregnancy (32-34 weeks), Braxton Hicks contractions can happen more often and may become stronger. These contractions are sometimes difficult to tell apart from true labor because they can be very uncomfortable. You should not feel embarrassed if you go to the hospital with false labor. Sometimes, the only way to tell if you are in true labor is for your health care provider to look for changes in the cervix. The health care provider will do a physical exam and may monitor your contractions. If you are not in true labor, the exam should show that your cervix is not dilating and your water has not broken. If there are no other health problems associated with your pregnancy, it is completely safe for you to be sent home with false labor. You may continue to have Braxton Hicks contractions until you go into true labor. How to tell the difference between true labor and false labor True labor  Contractions last 30-70 seconds.  Contractions become very regular.  Discomfort is usually felt in the top of the uterus, and it spreads to the lower abdomen and low back.  Contractions do not go away with walking.  Contractions usually become more intense and increase in frequency.  The cervix dilates and gets thinner. False labor  Contractions are usually shorter and not as strong as true labor contractions.  Contractions are usually irregular.  Contractions  are often felt in the front of the lower abdomen and in the groin.  Contractions may go away when you walk around or change positions while lying down.  Contractions get weaker and are shorter-lasting as time goes on.  The cervix usually does not dilate or become thin. Follow these instructions at home:   Take over-the-counter and prescription medicines only as told by your health care provider.  Keep up with your usual exercises and follow other instructions from your health care provider.  Eat and drink lightly if you think you are going into labor.  If Braxton Hicks contractions are making you uncomfortable: ? Change your position from lying down or resting to walking, or change from walking to resting. ? Sit and rest in a tub of warm water. ? Drink enough fluid to keep your urine pale yellow. Dehydration may cause these contractions. ? Do slow and deep breathing several times an hour.  Keep all follow-up prenatal visits as told by your health care provider. This is important. Contact a health care provider if:  You have a fever.  You have continuous pain in your abdomen. Get help right away if:  Your contractions become stronger, more regular, and closer together.  You have fluid leaking or gushing from your vagina.  You pass blood-tinged mucus (bloody show).  You have bleeding from your vagina.  You have low back pain that you never had before.  You feel your baby's head pushing down and causing pelvic pressure.  Your baby is not moving inside you as much as it used to. Summary  Contractions that occur before labor are   called Braxton Hicks contractions, false labor, or practice contractions.  Braxton Hicks contractions are usually shorter, weaker, farther apart, and less regular than true labor contractions. True labor contractions usually become progressively stronger and regular, and they become more frequent.  Manage discomfort from Braxton Hicks contractions  by changing position, resting in a warm bath, drinking plenty of water, or practicing deep breathing. This information is not intended to replace advice given to you by your health care provider. Make sure you discuss any questions you have with your health care provider. Document Revised: 05/02/2017 Document Reviewed: 10/03/2016 Elsevier Patient Education  2020 [employer] Fetal Movement Counts Patient Name: ________________________________________________ Patient Due Date: ____________________ What is a fetal movement count?  A fetal movement count is the number of times that you feel your baby move during a certain amount of time. This may also be called a fetal kick count. A fetal movement count is recommended for every pregnant woman. You may be asked to start counting fetal movements as early as week 28 of your pregnancy. Pay attention to when your baby is most active. You may notice your baby's sleep and wake cycles. You may also notice things that make your baby move more. You should do a fetal movement count:  When your baby is normally most active.  At the same time each day. A good time to count movements is while you are resting, after having something to eat and drink. How do I count fetal movements? 1. Find a quiet, comfortable area. Sit, or lie down on your side. 2. Write down the date, the start time and stop time, and the number of movements that you felt between those two times. Take this information with you to your health care visits. 3. Write down your start time when you feel the first movement. 4. Count kicks, flutters, swishes, rolls, and jabs. You should feel at least 10 movements. 5. You may stop counting after you have felt 10 movements, or if you have been counting for 2 hours. Write down the stop time. 6. If you do not feel 10 movements in 2 hours, contact your health care provider for further instructions. Your health care provider may want to do additional tests  to assess your baby's well-being. Contact a health care provider if:  You feel fewer than 10 movements in 2 hours.  Your baby is not moving like he or she usually does. Date: ____________ Start time: ____________ Stop time: ____________ Movements: ____________ Date: ____________ Start time: ____________ Stop time: ____________ Movements: ____________ Date: ____________ Start time: ____________ Stop time: ____________ Movements: ____________ Date: ____________ Start time: ____________ Stop time: ____________ Movements: ____________ Date: ____________ Start time: ____________ Stop time: ____________ Movements: ____________ Date: ____________ Start time: ____________ Stop time: ____________ Movements: ____________ Date: ____________ Start time: ____________ Stop time: ____________ Movements: ____________ Date: ____________ Start time: ____________ Stop time: ____________ Movements: ____________ Date: ____________ Start time: ____________ Stop time: ____________ Movements: ____________ This information is not intended to replace advice given to you by your health care provider. Make sure you discuss any questions you have with your health care provider. Document Revised: 01/07/2019 Document Reviewed: 01/07/2019 Elsevier Patient Education  2020 [employer] Inc[employer]  

## 2020-03-29 LAB — CULTURE, OB URINE: Culture: 10000 — AB

## 2020-04-03 DIAGNOSIS — O219 Vomiting of pregnancy, unspecified: Secondary | ICD-10-CM

## 2020-04-03 DIAGNOSIS — R519 Headache, unspecified: Secondary | ICD-10-CM

## 2020-04-03 DIAGNOSIS — O26893 Other specified pregnancy related conditions, third trimester: Secondary | ICD-10-CM

## 2020-04-03 MED ORDER — BUTALBITAL-APAP-CAFFEINE 50-325-40 MG PO CAPS: 1.0000 | ORAL_CAPSULE | Freq: Four times a day (QID) | ORAL | 0 refills | Status: DC | PRN

## 2020-04-03 MED ORDER — ONDANSETRON 4 MG PO TBDP: 4.0000 mg | ORAL_TABLET | Freq: Four times a day (QID) | ORAL | 0 refills | Status: DC | PRN

## 2020-04-04 ENCOUNTER — Ambulatory Visit (INDEPENDENT_AMBULATORY_CARE_PROVIDER_SITE_OTHER): Payer: Medicaid Other | Admitting: Advanced Practice Midwife

## 2020-04-04 ENCOUNTER — Encounter (HOSPITAL_COMMUNITY): Payer: Self-pay | Admitting: Obstetrics and Gynecology

## 2020-04-04 ENCOUNTER — Other Ambulatory Visit: Payer: Self-pay

## 2020-04-04 ENCOUNTER — Other Ambulatory Visit: Payer: Self-pay | Admitting: Advanced Practice Midwife

## 2020-04-04 ENCOUNTER — Inpatient Hospital Stay (HOSPITAL_COMMUNITY)
Admission: AD | Admit: 2020-04-04 | Discharge: 2020-04-04 | Disposition: A | Discharge status: Home / Self Care | Payer: Medicaid Other | Attending: Obstetrics and Gynecology | Admitting: Obstetrics and Gynecology

## 2020-04-04 VITALS — BP 110/67 | HR 89 | Wt 163.0 lb

## 2020-04-04 DIAGNOSIS — R1011 Right upper quadrant pain: Secondary | ICD-10-CM | POA: Insufficient documentation

## 2020-04-04 DIAGNOSIS — Z3480 Encounter for supervision of other normal pregnancy, unspecified trimester: Secondary | ICD-10-CM

## 2020-04-04 DIAGNOSIS — R519 Headache, unspecified: Secondary | ICD-10-CM | POA: Diagnosis present

## 2020-04-04 DIAGNOSIS — O99353 Diseases of the nervous system complicating pregnancy, third trimester: Secondary | ICD-10-CM | POA: Insufficient documentation

## 2020-04-04 DIAGNOSIS — G43009 Migraine without aura, not intractable, without status migrainosus: Secondary | ICD-10-CM | POA: Diagnosis not present

## 2020-04-04 DIAGNOSIS — O26893 Other specified pregnancy related conditions, third trimester: Secondary | ICD-10-CM | POA: Diagnosis not present

## 2020-04-04 DIAGNOSIS — Z3A36 36 weeks gestation of pregnancy: Secondary | ICD-10-CM

## 2020-04-04 DIAGNOSIS — O219 Vomiting of pregnancy, unspecified: Secondary | ICD-10-CM

## 2020-04-04 LAB — CBC
HCT: 30 % — ABNORMAL LOW (ref 36.0–46.0)
Hemoglobin: 9.5 g/dL — ABNORMAL LOW (ref 12.0–15.0)
MCH: 25.5 pg — ABNORMAL LOW (ref 26.0–34.0)
MCHC: 31.7 g/dL (ref 30.0–36.0)
MCV: 80.6 fL (ref 80.0–100.0)
Platelets: 308 10*3/uL (ref 150–400)
RBC: 3.72 MIL/uL — ABNORMAL LOW (ref 3.87–5.11)
RDW: 14.5 % (ref 11.5–15.5)
WBC: 8.2 10*3/uL (ref 4.0–10.5)
nRBC: 0 % (ref 0.0–0.2)

## 2020-04-04 LAB — COMPREHENSIVE METABOLIC PANEL
ALT: 13 U/L (ref 0–44)
AST: 18 U/L (ref 15–41)
Albumin: 2.5 g/dL — ABNORMAL LOW (ref 3.5–5.0)
Alkaline Phosphatase: 122 U/L (ref 38–126)
Anion gap: 10 (ref 5–15)
BUN: <5 mg/dL — ABNORMAL LOW (ref 6–20)
CO2: 20 mmol/L — ABNORMAL LOW (ref 22–32)
Calcium: 8.6 mg/dL — ABNORMAL LOW (ref 8.9–10.3)
Chloride: 105 mmol/L (ref 98–111)
Creatinine, Ser: 0.55 mg/dL (ref 0.44–1.00)
GFR, Estimated: >60 mL/min (ref >60)
Glucose, Bld: 81 mg/dL (ref 70–99)
Potassium: 3.6 mmol/L (ref 3.5–5.1)
Sodium: 135 mmol/L (ref 135–145)
Total Bilirubin: 0.4 mg/dL (ref 0.3–1.2)
Total Protein: 6.2 g/dL — ABNORMAL LOW (ref 6.5–8.1)

## 2020-04-04 LAB — URINALYSIS, ROUTINE W REFLEX MICROSCOPIC
Bilirubin Urine: NEGATIVE
Glucose, UA: NEGATIVE mg/dL
Hgb urine dipstick: NEGATIVE
Ketones, ur: NEGATIVE mg/dL
Nitrite: NEGATIVE
Protein, ur: NEGATIVE mg/dL
Specific Gravity, Urine: 1.01 (ref 1.005–1.030)
pH: 7.5 (ref 5.0–8.0)

## 2020-04-04 LAB — PROTEIN / CREATININE RATIO, URINE
Creatinine, Urine: 78.3 mg/dL
Total Protein, Urine: <6 mg/dL

## 2020-04-04 LAB — URINALYSIS, MICROSCOPIC (REFLEX): RBC / HPF: NONE SEEN RBC/hpf (ref 0–5)

## 2020-04-04 LAB — LIPASE, BLOOD: Lipase: 24 U/L (ref 11–51)

## 2020-04-04 MED ORDER — METOCLOPRAMIDE HCL 10 MG PO TABS: 10.0000 mg | ORAL_TABLET | Freq: Three times a day (TID) | ORAL | 0 refills | Status: DC | PRN

## 2020-04-04 MED ORDER — LACTATED RINGERS IV BOLUS
1000.0000 mL | Freq: Once | INTRAVENOUS | Status: AC
  Administered 2020-04-04: 1000 mL via INTRAVENOUS

## 2020-04-04 MED ORDER — FERUMOXYTOL INJECTION 510 MG/17 ML: 510.0000 mg | INTRAVENOUS | 0 refills | Status: DC

## 2020-04-04 MED ORDER — DIPHENHYDRAMINE HCL 50 MG/ML IJ SOLN
25.0000 mg | Freq: Once | INTRAMUSCULAR | Status: AC
  Administered 2020-04-04: 25 mg via INTRAVENOUS
  Filled 2020-04-04: qty 1

## 2020-04-04 MED ORDER — DEXAMETHASONE SODIUM PHOSPHATE 10 MG/ML IJ SOLN
10.0000 mg | Freq: Once | INTRAMUSCULAR | Status: AC
  Administered 2020-04-04: 10 mg via INTRAVENOUS
  Filled 2020-04-04: qty 1

## 2020-04-04 MED ORDER — METOCLOPRAMIDE HCL 5 MG/ML IJ SOLN
10.0000 mg | Freq: Once | INTRAMUSCULAR | Status: AC
  Administered 2020-04-04: 10 mg via INTRAVENOUS
  Filled 2020-04-04: qty 2

## 2020-04-04 MED ORDER — CYCLOBENZAPRINE HCL 5 MG PO TABS: 5.0000 mg | ORAL_TABLET | Freq: Three times a day (TID) | ORAL | 0 refills | Status: DC | PRN

## 2020-04-04 NOTE — Progress Notes (Signed)
Outpatient Fereheme infusions, weekly x 2 doses ordered. Pt with hgb 9.5 04/04/20 with frequent headaches.

## 2020-04-04 NOTE — Progress Notes (Signed)
   PRENATAL VISIT NOTE  Subjective:  Jacqueline [name] is a 19 y.o. G2P0102 at [redacted]w[redacted]d being seen today for ongoing prenatal care.  She is currently monitored for the following issues for this low-risk pregnancy and has Supervision of other normal pregnancy, antepartum; Hx of preeclampsia, prior pregnancy, currently pregnant; Hx of postpartum hemorrhage, currently pregnant; Short interval between pregnancies complicating pregnancy, antepartum; Chlamydia trachomatis infection during pregnancy in first trimester; Current nicotine use; Nausea and vomiting during pregnancy prior to [redacted] weeks gestation; and Pyelonephritis affecting pregnancy in second trimester on their problem list.  Patient reports severe headache with n/v x 2 days, contractions x 2 weeks, worse at night.   .  .   . Denies leaking of fluid.   The following portions of the patient's history were reviewed and updated as appropriate: allergies, current medications, past family history, past medical history, past social history, past surgical history and problem list.   Objective:  There were no vitals filed for this visit.  Fetal Status:           General:  Alert, oriented and cooperative. Patient is in no acute distress.  Skin: Skin is warm and dry. No rash noted.   Cardiovascular: Normal heart rate noted  Respiratory: Normal respiratory effort, no problems with respiration noted  Abdomen: Soft, gravid, appropriate for gestational age.        Pelvic: Cervical exam deferred        Extremities: Normal range of motion.     Mental Status: Normal mood and affect. Normal behavior. Normal judgment and thought content.   Assessment and Plan:  Pregnancy: G2P0102 at [redacted]w[redacted]d 1. [redacted] weeks gestation of pregnancy   2. Supervision of other normal pregnancy, antepartum --Anticipatory guidance about next visits/weeks of pregnancy given. --Next visit in 1 week in office with midwife  3. Pregnancy headache in third trimester --Headache, ?  Migraine given photophobia and nausea.  Prescribed Zofran and Fioricet yesterday, pt hasn't picked up. Took Diclegis she had at home but it didn't help. --BP wnl today and yesterday when checked at home --Pt unable to keep down even water in last 2 days, likely contributing to headache. --Pt does not feel like she can manage this at home with meds, so will send to [facility]U for migraine treatment.   4. Nausea and vomiting in pregnancy --See above  Term labor symptoms and general obstetric precautions including but not limited to vaginal bleeding, contractions, leaking of fluid and fetal movement were reviewed in detail with the patient. Please refer to After Visit Summary for other counseling recommendations.   No follow-ups on file.  Future Appointments  Date Time Provider Department Center  04/04/2020  8:55 AM Leftwich-Kirby, Wilmer Floor, CNM [facility] None    Jacqueline [name], CNM

## 2020-04-04 NOTE — [facility]U Provider Note (Signed)
History     CSN: [id number]  Arrival date and time: 04/04/20 1051   First Provider Initiated Contact with Patient 04/04/20 1134      Chief Complaint  Patient presents with  . Headache   HPI [place] Baus is a 19 y.o. G2P0102 at [redacted]w[redacted]d who presents from the office for a headache. Has history of migraines. Current headache started 2 days ago. Was prescribed fioricet & zofran yesterday but hasn't picked up her meds yet. Took a tylenol last night without relief. Has had n/v with her headache as well and endorses photophobia. Reports some RUQ pain with deep breaths. No visual disturbance. Denies any OB complaints. Good fetal movement. Was induced at 36 wks with her last pregnancy for preeclampsia but reports no issues with her blood pressures this pregnancy.   Location: head Quality: aching Severity: 10/10 on pain scale Duration: 2 days Timing: constant Modifying factors: worse with lights Associated signs and symptoms: n/v  OB History    Gravida  2   Para  1   Term  0   Preterm  1   AB  0   Living  2     SAB  0   TAB  0   Ectopic  0   Multiple  1   Live Births  2           Past Medical History:  Diagnosis Date  . Asthma   . Migraines   . Pregnancy induced hypertension     Past Surgical History:  Procedure Laterality Date  . WISDOM TOOTH EXTRACTION      Family History  Problem Relation Age of Onset  . Hypertension Mother   . Asthma Brother   . Hypertension Brother   . Diabetes Brother   . Hypertension Maternal Aunt   . Diabetes Maternal Aunt   . Autism spectrum disorder Maternal Aunt   . Hypertension Maternal Grandmother   . Aneurysm Maternal Uncle     Social History   Tobacco Use  . Smoking status: Never Smoker  . Smokeless tobacco: Never Used  Vaping Use  . Vaping Use: Former  Substance Use Topics  . Alcohol use: Never    Alcohol/week: 0.0 standard drinks  . Drug use: Not Currently    Types: Marijuana    Comment: NOT SMOKED  DURING PREG    Allergies:  Allergies  Allergen Reactions  . Tomato Other (See Comments)    Makes asthma worse    Medications Prior to Admission  Medication Sig Dispense Refill Last Dose  . aspirin EC 81 MG tablet Take 1 tablet (81 mg total) by mouth daily. Take after 12 weeks for prevention of preeclampsia later in pregnancy. Start taking on May 18th 300 tablet 0   . Butalbital-APAP-Caffeine 50-325-40 MG capsule Take 1-2 capsules by mouth every 6 (six) hours as needed for headache. 30 capsule 0   . Elastic Bandages & Supports (COMFORT FIT MATERNITY SUPP MED) MISC 1 Device by Does not apply route daily. 1 each 0   . famotidine (PEPCID) 20 MG tablet Take 1 tablet (20 mg total) by mouth 2 (two) times daily. 60 tablet 0   . NIFEdipine (PROCARDIA) 10 MG capsule Take 1 capsule (10 mg total) by mouth every 6 (six) hours as needed. For contractions. 30 capsule 1   . ondansetron (ZOFRAN ODT) 4 MG disintegrating tablet Take 1 tablet (4 mg total) by mouth every 6 (six) hours as needed for nausea. 20 tablet 0   . Prenatal  Vit-Fe Fumarate-FA (MULTIVITAMIN-PRENATAL) 27-0.8 MG TABS tablet Take 1 tablet by mouth daily at 12 noon.       Review of Systems  Constitutional: Negative.   Eyes: Positive for photophobia. Negative for visual disturbance.  Respiratory: Negative.   Cardiovascular: Negative.   Gastrointestinal: Positive for abdominal pain, nausea and vomiting. Negative for constipation and diarrhea.  Genitourinary: Negative.   Neurological: Positive for headaches.   Physical Exam   Blood pressure 106/63, pulse 96, temperature 98.4 F (36.9 C), temperature source Oral, resp. rate 18, height 5\' 3"  (1.6 m), weight 74 kg, last menstrual period 07/07/2019, SpO2 99 %, currently breastfeeding.  Patient Vitals for the past 24 hrs:  BP Temp Temp src Pulse Resp SpO2 Height Weight  04/04/20 1131 106/63 -- -- 96 -- -- -- --  04/04/20 1121 117/67 -- -- (!) 104 -- -- -- --  04/04/20 1108 114/65 98.4 F  (36.9 C) Oral (!) 101 18 99 % -- --  04/04/20 1101 -- -- -- -- -- -- 5\' 3"  (1.6 m) 74 kg    Physical Exam Vitals and nursing note reviewed.  Constitutional:      General: She is not in acute distress.    Appearance: She is well-developed.  HENT:     Head: Normocephalic.  Cardiovascular:     Rate and Rhythm: Normal rate and regular rhythm.     Heart sounds: Normal heart sounds.  Pulmonary:     Effort: Pulmonary effort is normal. No respiratory distress.     Breath sounds: Normal breath sounds. No wheezing.  Abdominal:     Palpations: Abdomen is soft.     Tenderness: There is no abdominal tenderness.  Musculoskeletal:     Right lower leg: No edema.     Left lower leg: No edema.  Skin:    General: Skin is warm and dry.  Neurological:     Mental Status: She is alert.     Deep Tendon Reflexes: Reflexes normal.  Psychiatric:        Mood and Affect: Mood normal.        Behavior: Behavior normal.    Fetal Tracing:  Baseline: 135 Variability: moderate Accelerations: 15x15 Decelerations: none  Toco: UI   MAU Course  Procedures Results for orders placed or performed during the hospital encounter of 04/04/20 (from the past 24 hour(s))  CBC     Status: Abnormal   Collection Time: 04/04/20 12:04 PM  Result Value Ref Range   WBC 8.2 4.0 - 10.5 K/uL   RBC 3.72 (L) 3.87 - 5.11 MIL/uL   Hemoglobin 9.5 (L) 12.0 - 15.0 g/dL   HCT 13/02/21 (L) 36 - 46 %   MCV 80.6 80.0 - 100.0 fL   MCH 25.5 (L) 26.0 - 34.0 pg   MCHC 31.7 30.0 - 36.0 g/dL   RDW 13/02/21 29.9 - 37.1 %   Platelets 308 150 - 400 K/uL   nRBC 0.0 0.0 - 0.2 %  Comprehensive metabolic panel     Status: Abnormal   Collection Time: 04/04/20 12:04 PM  Result Value Ref Range   Sodium 135 135 - 145 mmol/L   Potassium 3.6 3.5 - 5.1 mmol/L   Chloride 105 98 - 111 mmol/L   CO2 20 (L) 22 - 32 mmol/L   Glucose, Bld 81 70 - 99 mg/dL   BUN <5 (L) 6 - 20 mg/dL   Creatinine, Ser 78.9 0.44 - 1.00 mg/dL   Calcium 8.6 (L) 8.9 - 10.3  mg/dL  Total Protein 6.2 (L) 6.5 - 8.1 g/dL   Albumin 2.5 (L) 3.5 - 5.0 g/dL   AST 18 15 - 41 U/L   ALT 13 0 - 44 U/L   Alkaline Phosphatase 122 38 - 126 U/L   Total Bilirubin 0.4 0.3 - 1.2 mg/dL   GFR, Estimated >67 >20 mL/min   Anion gap 10 5 - 15  Lipase, blood     Status: None   Collection Time: 04/04/20 12:04 PM  Result Value Ref Range   Lipase 24 11 - 51 U/L  Urinalysis, Routine w reflex microscopic     Status: Abnormal   Collection Time: 04/04/20 12:32 PM  Result Value Ref Range   Color, Urine YELLOW YELLOW   APPearance CLEAR CLEAR   Specific Gravity, Urine 1.010 1.005 - 1.030   pH 7.5 5.0 - 8.0   Glucose, UA NEGATIVE NEGATIVE mg/dL   Hgb urine dipstick NEGATIVE NEGATIVE   Bilirubin Urine NEGATIVE NEGATIVE   Ketones, ur NEGATIVE NEGATIVE mg/dL   Protein, ur NEGATIVE NEGATIVE mg/dL   Nitrite NEGATIVE NEGATIVE   Leukocytes,Ua SMALL (A) NEGATIVE  Protein / creatinine ratio, urine     Status: None   Collection Time: 04/04/20 12:32 PM  Result Value Ref Range   Creatinine, Urine 78.30 mg/dL   Total Protein, Urine <6 mg/dL   Protein Creatinine Ratio        0.00 - 0.15 mg/mg[Cre]  Urinalysis, Microscopic (reflex)     Status: Abnormal   Collection Time: 04/04/20 12:32 PM  Result Value Ref Range   RBC / HPF NONE SEEN 0 - 5 RBC/hpf   WBC, UA 6-10 0 - 5 WBC/hpf   Bacteria, UA FEW (A) NONE SEEN   Squamous Epithelial / LPF 0-5 0 - 5    MDM Patient here for treatment of migraine. Has history of preeclampsia but is normotensive today. Preeclampsia labs collected d/t h/a, history, & RUQ pain. Labs normal.   IV headache cocktail given (decadron, reglan, benadryl). Pt reports improvement in symptoms. Will send home with rx reglan & flexeril to use with tylenol and/or fioricet for future headaches.   Patient's hemoglobin is back down to 9.5 - she states she hasn't been taking her iron supplement Her ob (Leftwich-Kirby) is working on getting her set up for outpatient iron  transfusion.   Assessment and Plan   1. Migraine without aura and without status migrainosus, not intractable   2. [redacted] weeks gestation of pregnancy    -rx flexeril & reglan. Given instructions on medications & how to take them to treat her headaches.  -Reviewed preeclampsia precautions & reasons to return to MAU  Judeth Horn 04/04/2020, 11:34 AM

## 2020-04-04 NOTE — MAU Note (Signed)
Presents with c/o H/A, unrelieved with Tylenol.  Endorses visual disturbances and epigastric pain.  States Hx Pre-Eclampsia with previous pregnancy.  States no FM this morning, seen @ [facility] for appointment this am, told "everything is fine".  Denies VB, having spotting.  Denies LOF.

## 2020-04-04 NOTE — Patient Instructions (Signed)
Things to Try After 37 weeks to Encourage Labor/Get Ready for Labor:   1.  Try the Miles Circuit at www.milescircuit.com daily to improve baby's position and encourage the onset of labor.  2. Walk a little and rest a little every day.  Change positions often.  3. Cervical Ripening: May try one or both a. Red Raspberry Leaf capsules or tea:  two 300mg or 400mg tablets with each meal, 2-3 times a day, or 1-3 cups of tea daily  Potential Side Effects Of Raspberry Leaf:  Most women do not experience any side effects from drinking raspberry leaf tea. However, nausea and loose stools are possible   b. Evening Primrose Oil capsules: may take 1 to 3 capsules daily. Take 1-2 capsules by mouth each day and place one capsule vaginally at night.  You may also prick the vaginal capsule to release the oil prior to inserting in the vagina. Some of the potential side effects:  Upset stomach  Loose stools or diarrhea  Headaches  Nausea  4. Sex (and especially sex with orgasm) can also help the cervix ripen and encourage labor onset.  Labor Precautions Reasons to come to [facility] at [facility]:  1.  Contractions are  5 minutes apart or less, each last 1 minute, these have been going on for 1-2 hours, and you cannot walk or talk during them 2.  You have a large gush of fluid, or a trickle of fluid that will not stop and you have to wear a pad 3.  You have bleeding that is bright red, heavier than spotting--like menstrual bleeding (spotting can be normal in early labor or after a check of your cervix) 4.  You do not feel the baby moving like he/she normally does 

## 2020-04-04 NOTE — Discharge Instructions (Signed)
For prevention of migraines in pregnancy: -Magnesium, 400mg by mouth, once daily -Vitamin B2, 400mg by mouth, once daily  For treatment of migraines in pregnancy: -take medication at the first sign of the pain of a headache, or the first sign of your aura -start with 1000mg Tylenol (do not exceed 4000mg of Tylenol in 24hrs), with or without Reglan 10mg -if no relief after 1-2hours, can take Flexeril 10mg -if headache is severe and not relieved by the above, may take Fioricet, 1 tablet, no more than 3 days per month -Fioricet should only be used as a rescue medication, when absolutely necessary -if the above regimen does not resolve your headache at all, please come to MAU for additional treatment -if you take Fioricet, please be aware that this has Tylenol in it and will contribute to the 4,000mg of Tylenol that you are allowed to take per day  

## 2020-04-07 ENCOUNTER — Other Ambulatory Visit (HOSPITAL_COMMUNITY): Payer: Self-pay | Admitting: *Deleted

## 2020-04-11 ENCOUNTER — Other Ambulatory Visit: Payer: Self-pay

## 2020-04-11 ENCOUNTER — Encounter (HOSPITAL_COMMUNITY)
Admission: RE | Admit: 2020-04-11 | Discharge: 2020-04-11 | Disposition: A | Discharge status: Home / Self Care | Payer: Medicaid Other | Source: Ambulatory Visit | Attending: Obstetrics and Gynecology | Admitting: Obstetrics and Gynecology

## 2020-04-11 DIAGNOSIS — Z3A00 Weeks of gestation of pregnancy not specified: Secondary | ICD-10-CM | POA: Insufficient documentation

## 2020-04-11 DIAGNOSIS — D649 Anemia, unspecified: Secondary | ICD-10-CM | POA: Diagnosis not present

## 2020-04-11 DIAGNOSIS — O99019 Anemia complicating pregnancy, unspecified trimester: Secondary | ICD-10-CM | POA: Diagnosis not present

## 2020-04-11 IMAGING — US US PELVIS COMPLETE
1 series · 13 of 25 positions shown · non-contrast
Comparison: Ultrasound 10/29/2017

CLINICAL DATA: Vaginal bleeding with pelvic pain

EXAM:
TRANSABDOMINAL AND TRANSVAGINAL ULTRASOUND OF PELVIS
DOPPLER ULTRASOUND OF OVARIES
TECHNIQUE: Both transabdominal and transvaginal ultrasound examinations of the
pelvis were performed. Transabdominal technique was performed for
global imaging of the pelvis including uterus, ovaries, adnexal
regions, and pelvic cul-de-sac.
It was necessary to proceed with endovaginal exam following the
transabdominal exam to visualize the uterus endometrium ovaries.
Color and duplex Doppler ultrasound was utilized to evaluate blood
flow to the ovaries.

[Series 1: us pelvis complete · 86 acquisitions, 13 frames shown]
[im 1/86]
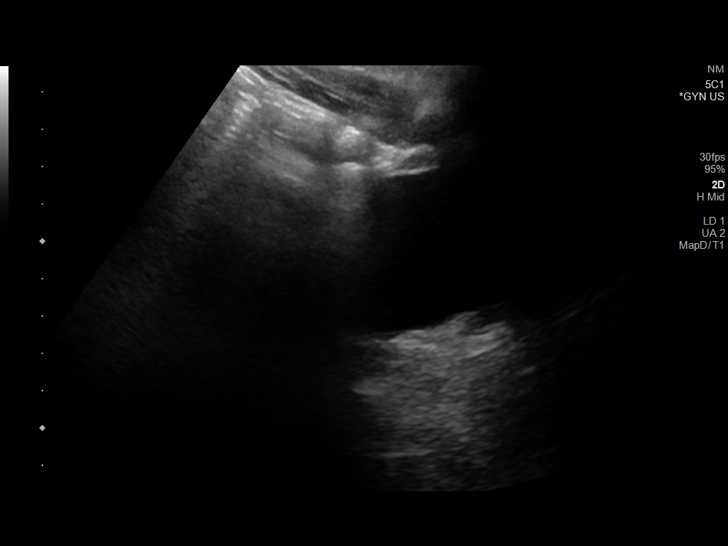
[im 8/86]
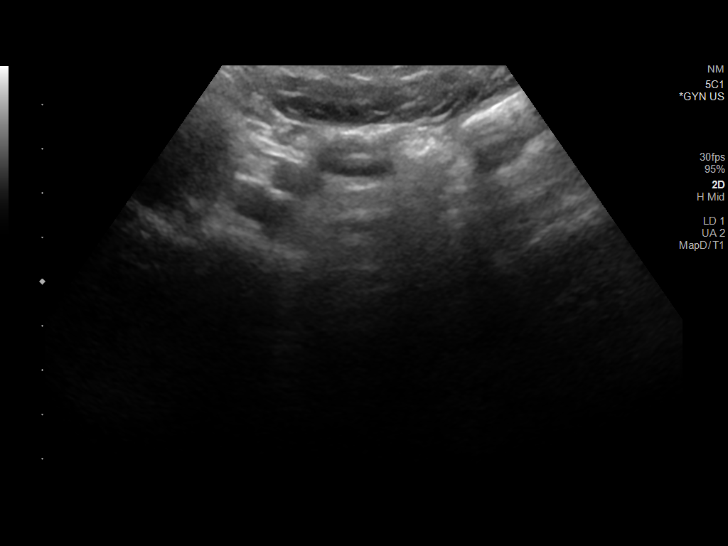
[im 15/86]
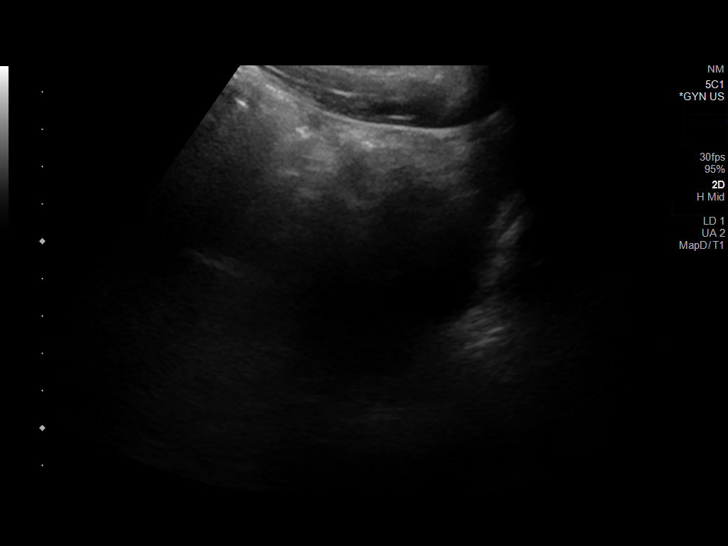
[im 22/86]
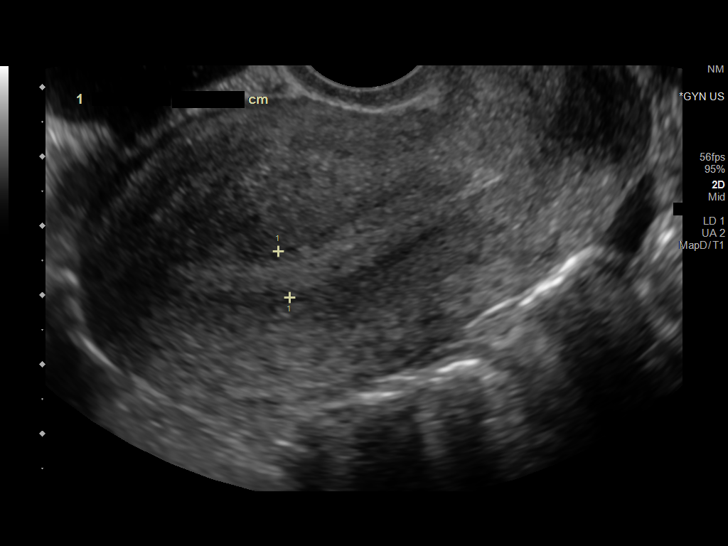
[im 29/86]
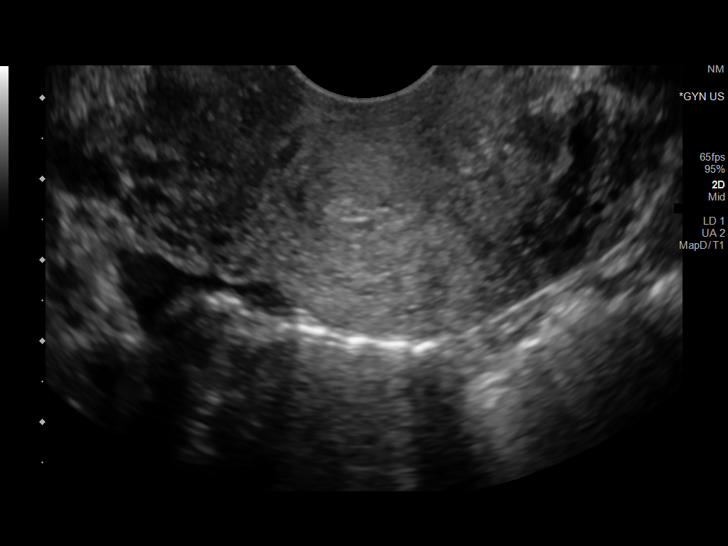
[im 36/86]
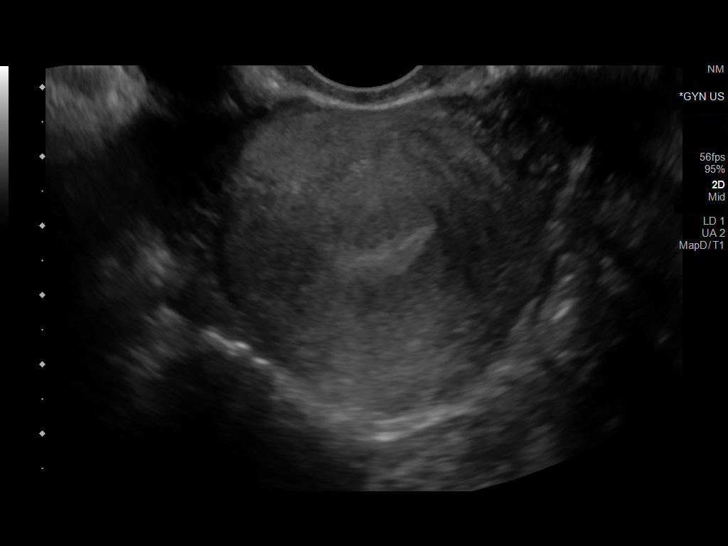
[im 43/86]
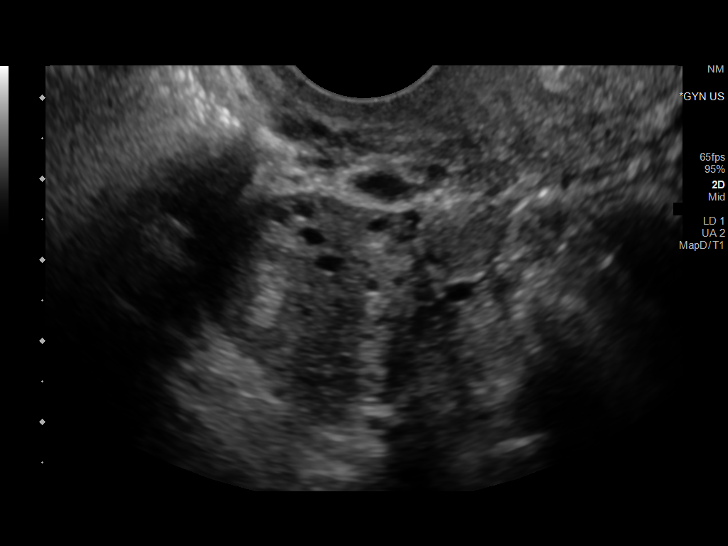
[im 50/86]
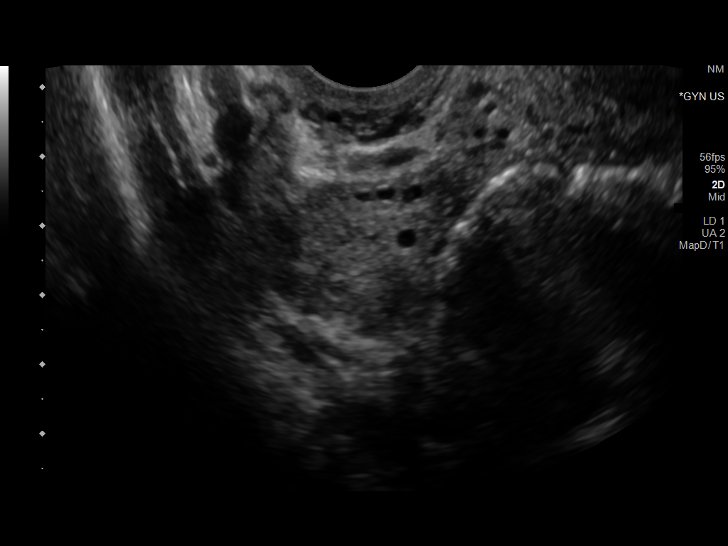
[im 57/86]
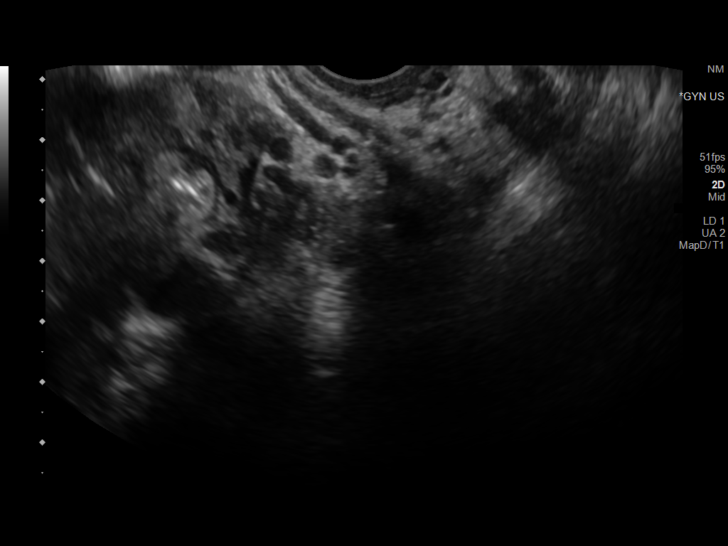
[im 64/86]
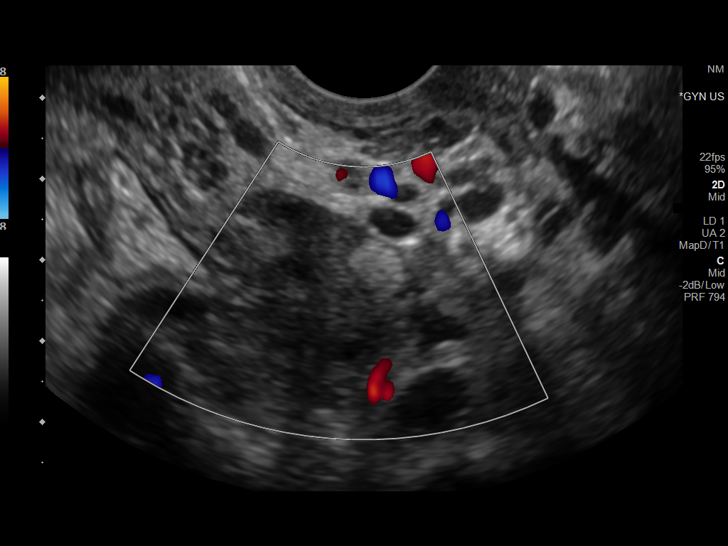
[im 71/86]
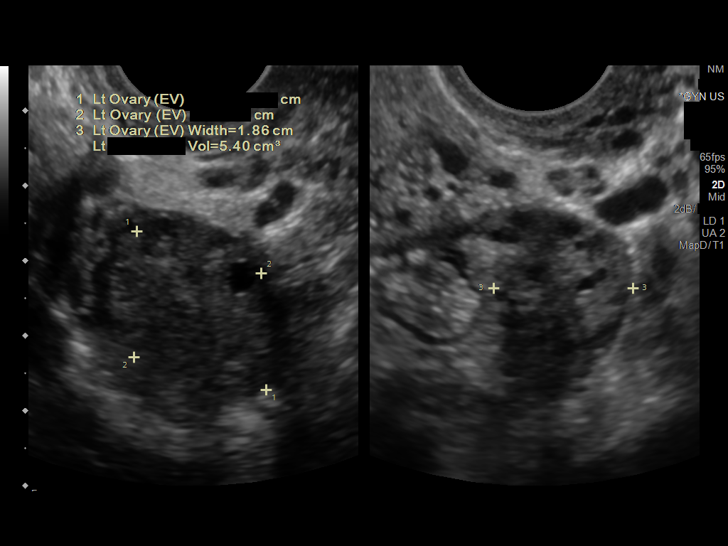
[im 78/86]
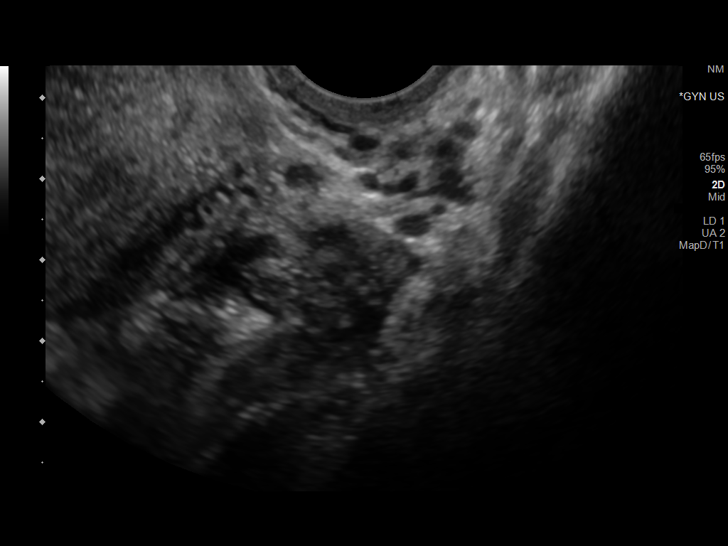
[im 86/86]
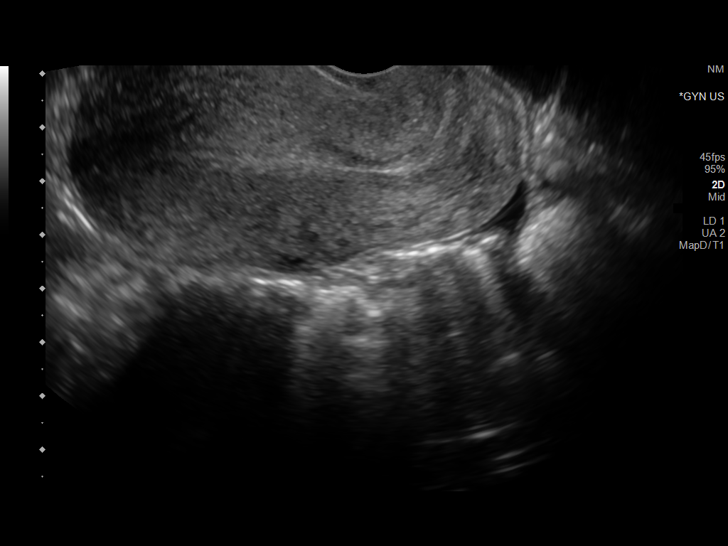

[13 of 25 positions shown; findings below may reference images not displayed]

FINDINGS: Uterus

Measurements: 8.6 x 4.4 x 5 cm = volume: 96.6 mL. No fibroids or
other mass visualized.

Endometrium

Thickness: 6.9 mm.  No focal abnormality visualized.

Right ovary

Measurements: 3 x 2.6 x 2.7 cm = volume: 10.8 mL. Normal
appearance/no adnexal mass. Nonspecific echogenic focus measuring
1.1 cm possible hemorrhagic follicle

Left ovary

Measurements: 2.8 x 2.1 x 1.9 cm = volume: 5.4 mL. Normal
appearance/no adnexal mass. Nonspecific 8 mm echogenic focus,
possible hemorrhagic follicles

Pulsed Doppler evaluation of both ovaries demonstrates normal
low-resistance arterial and venous waveforms.

Other findings

No abnormal free fluid.
IMPRESSION: No sonographic evidence for torsion. Normal premenopausal
endometrial thickness.

## 2020-04-11 IMAGING — US US TRANSVAGINAL NON-OB
1 series · 13 of 25 positions shown · non-contrast
Comparison: Ultrasound 10/29/2017

CLINICAL DATA: Vaginal bleeding with pelvic pain

EXAM:
TRANSABDOMINAL AND TRANSVAGINAL ULTRASOUND OF PELVIS
DOPPLER ULTRASOUND OF OVARIES
TECHNIQUE: Both transabdominal and transvaginal ultrasound examinations of the
pelvis were performed. Transabdominal technique was performed for
global imaging of the pelvis including uterus, ovaries, adnexal
regions, and pelvic cul-de-sac.
It was necessary to proceed with endovaginal exam following the
transabdominal exam to visualize the uterus endometrium ovaries.
Color and duplex Doppler ultrasound was utilized to evaluate blood
flow to the ovaries.

[Series 1: us transvaginal non-ob · 87 acquisitions, 13 frames shown]
[im 1/87]
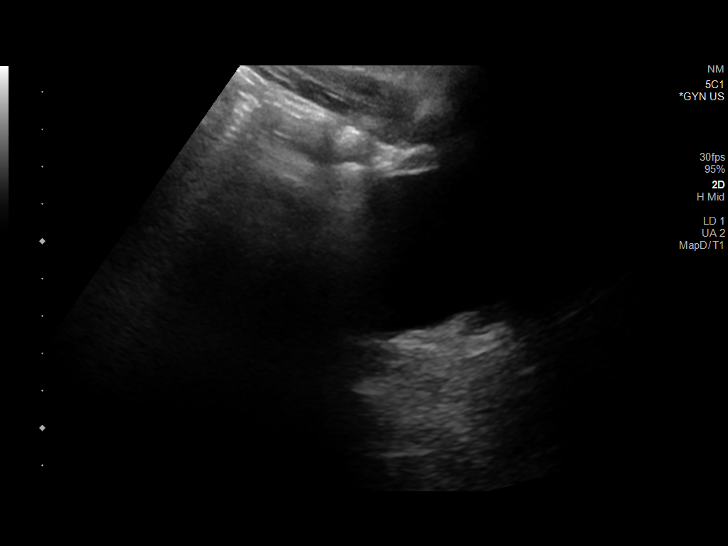
[im 8/87]
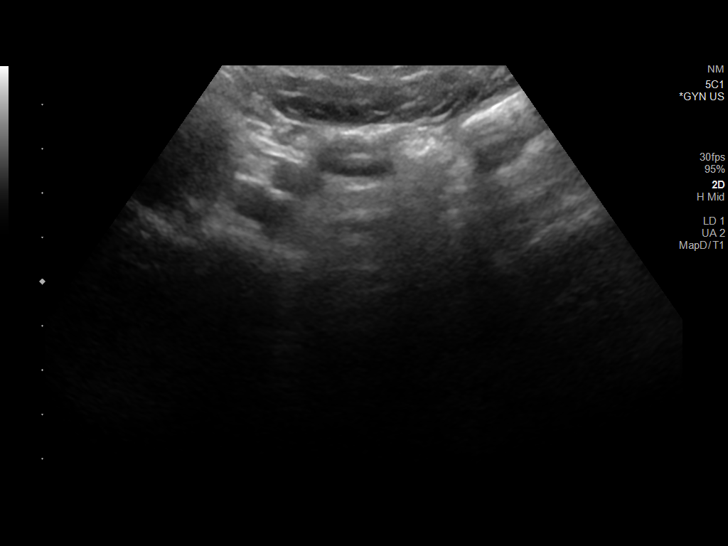
[im 15/87]
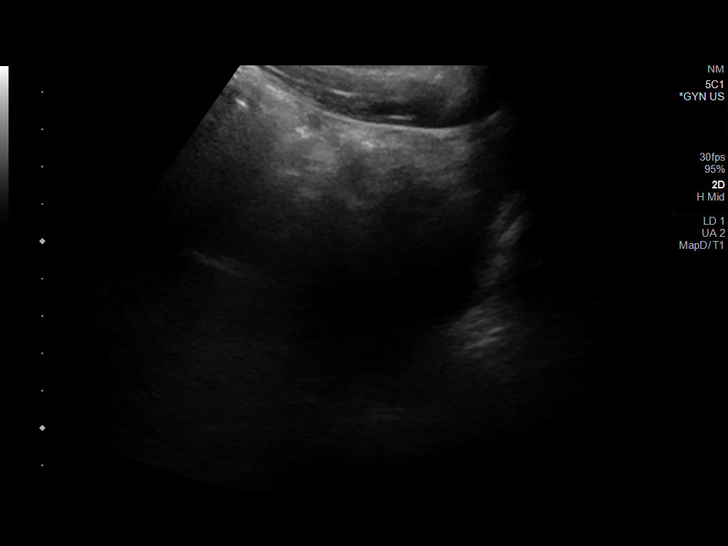
[im 22/87]
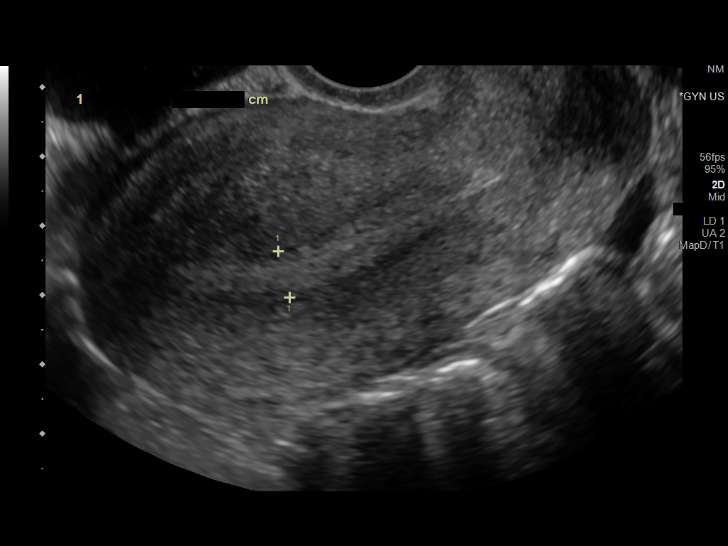
[im 29/87]
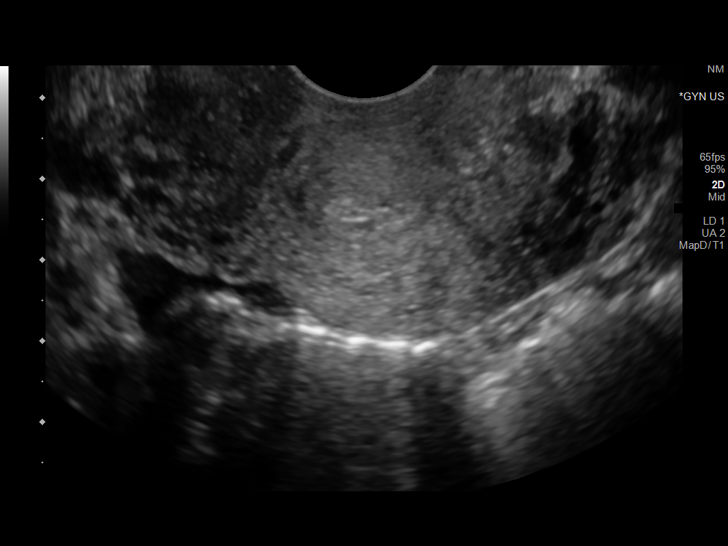
[im 36/87]
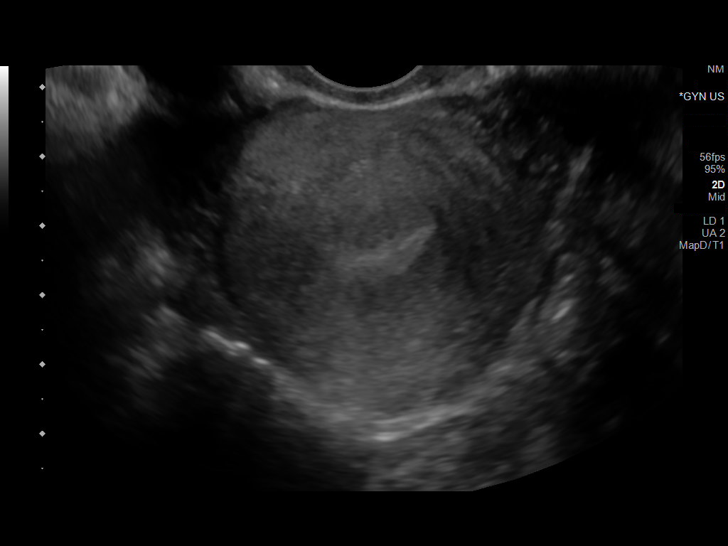
[im 44/87]
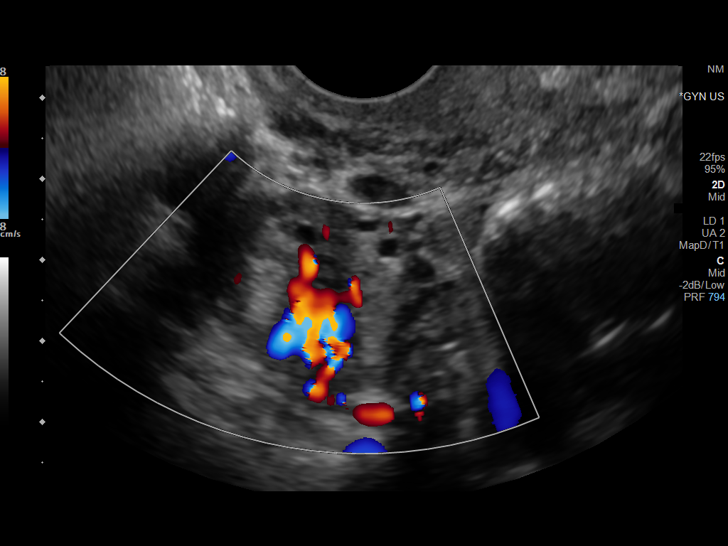
[im 51/87]
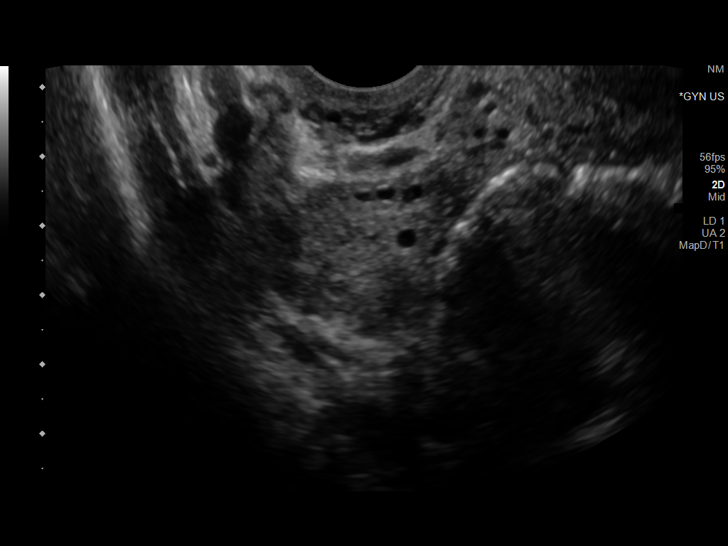
[im 58/87]
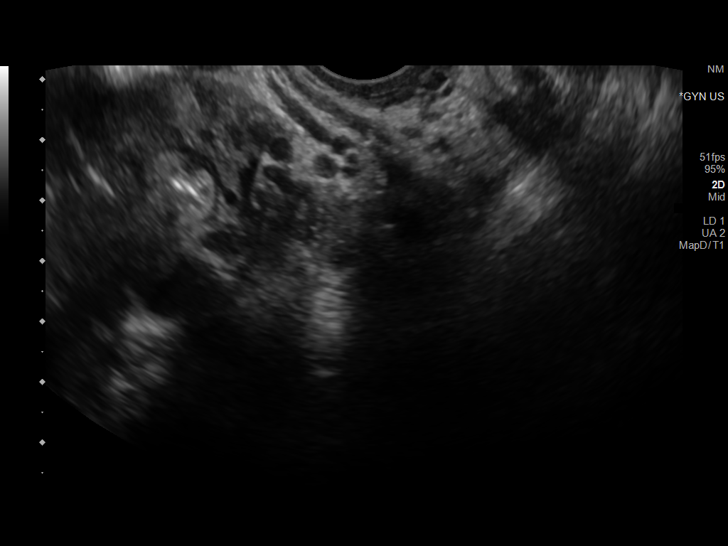
[im 65/87]
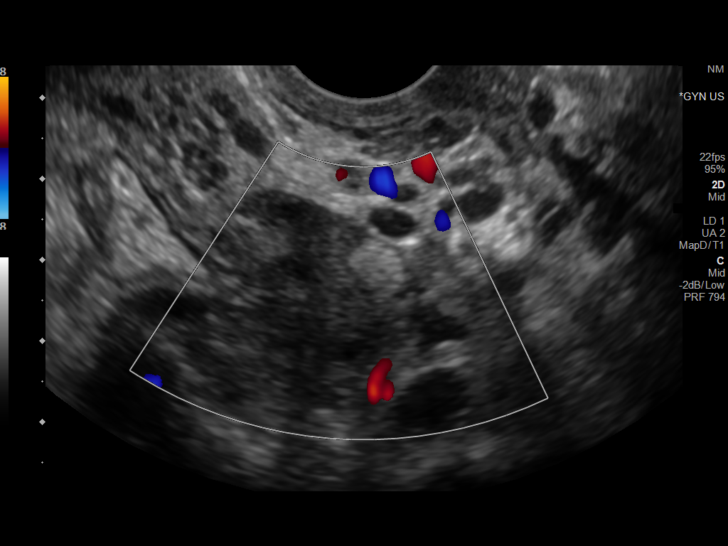
[im 72/87]
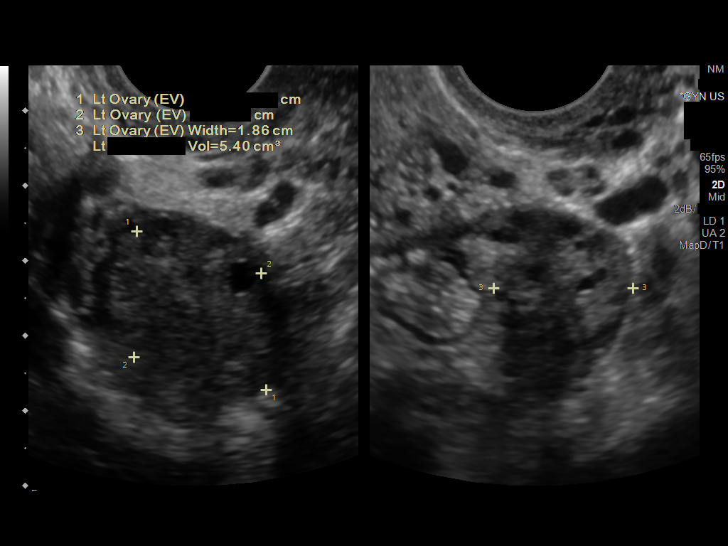
[im 79/87]
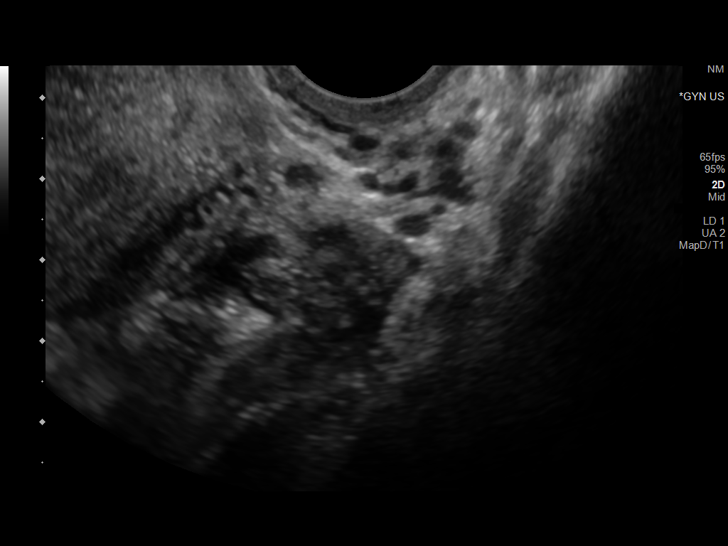
[im 87/87]
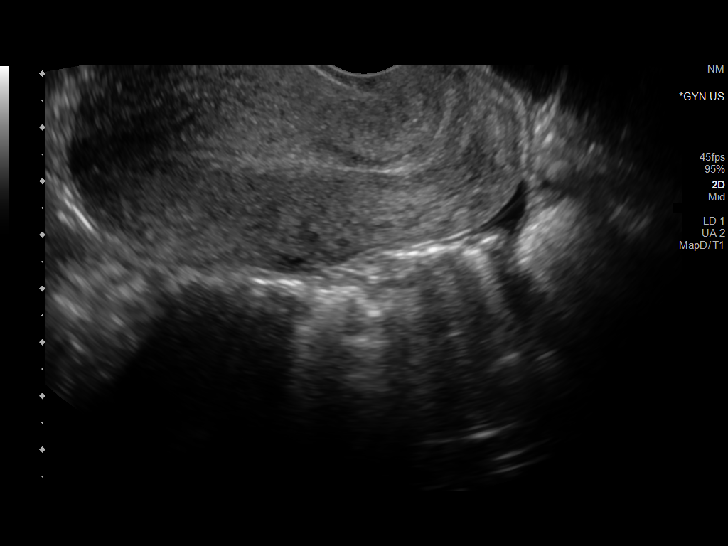

[13 of 25 positions shown; findings below may reference images not displayed]

FINDINGS: Uterus

Measurements: 8.6 x 4.4 x 5 cm = volume: 96.6 mL. No fibroids or
other mass visualized.

Endometrium

Thickness: 6.9 mm.  No focal abnormality visualized.

Right ovary

Measurements: 3 x 2.6 x 2.7 cm = volume: 10.8 mL. Normal
appearance/no adnexal mass. Nonspecific echogenic focus measuring
1.1 cm possible hemorrhagic follicle

Left ovary

Measurements: 2.8 x 2.1 x 1.9 cm = volume: 5.4 mL. Normal
appearance/no adnexal mass. Nonspecific 8 mm echogenic focus,
possible hemorrhagic follicles

Pulsed Doppler evaluation of both ovaries demonstrates normal
low-resistance arterial and venous waveforms.

Other findings

No abnormal free fluid.
IMPRESSION: No sonographic evidence for torsion. Normal premenopausal
endometrial thickness.

## 2020-04-11 MED ORDER — SODIUM CHLORIDE 0.9 % IV SOLN
510.0000 mg | INTRAVENOUS | Status: DC
  Administered 2020-04-11: 510 mg via INTRAVENOUS
  Filled 2020-04-11: qty 17

## 2020-04-11 NOTE — Discharge Instructions (Signed)

## 2020-04-12 ENCOUNTER — Encounter: Payer: Medicaid Other | Admitting: Obstetrics

## 2020-04-12 ENCOUNTER — Telehealth: Payer: Self-pay

## 2020-04-12 NOTE — Telephone Encounter (Signed)
Call patient- she reports being really tired this morning. She did have an iron infusion on yesterday. I have advised patient to drink lots of fluid and eat regular meals. She does report baby is moving ok. I have advised her to follow up with [facility] is something changes. Patient verbalized understanding.

## 2020-04-13 ENCOUNTER — Other Ambulatory Visit: Payer: Self-pay

## 2020-04-13 ENCOUNTER — Ambulatory Visit (INDEPENDENT_AMBULATORY_CARE_PROVIDER_SITE_OTHER): Payer: Medicaid Other | Admitting: Obstetrics

## 2020-04-13 VITALS — BP 121/73 | HR 79 | Wt 158.1 lb

## 2020-04-13 DIAGNOSIS — D508 Other iron deficiency anemias: Secondary | ICD-10-CM

## 2020-04-13 DIAGNOSIS — R8271 Bacteriuria: Secondary | ICD-10-CM

## 2020-04-13 DIAGNOSIS — Z3480 Encounter for supervision of other normal pregnancy, unspecified trimester: Secondary | ICD-10-CM

## 2020-04-13 NOTE — Progress Notes (Signed)
Subjective:  [name] is a 19 y.o. G2P0102 at [redacted]w[redacted]d being seen today for ongoing prenatal care.  She is currently monitored for the following issues for this low-risk pregnancy and has Supervision of other normal pregnancy, antepartum; Hx of preeclampsia, prior pregnancy, currently pregnant; Hx of postpartum hemorrhage, currently pregnant; Short interval between pregnancies complicating pregnancy, antepartum; Chlamydia trachomatis infection during pregnancy in first trimester; Current nicotine use; Nausea and vomiting during pregnancy prior to [redacted] weeks gestation; and Pyelonephritis affecting pregnancy in second trimester on their problem list.  Patient reports pelvic pressure.  Contractions: Irregular.  .  Movement: Present. Denies leaking of fluid.   The following portions of the patient's history were reviewed and updated as appropriate: allergies, current medications, past family history, past medical history, past social history, past surgical history and problem list. Problem list updated.  Objective:   Vitals:   04/13/20 1148  BP: 121/73  Pulse: 79  Weight: 158 lb 1.6 oz (71.7 kg)    Fetal Status:     Movement: Present     General:  Alert, oriented and cooperative. Patient is in no acute distress.  Skin: Skin is warm and dry. No rash noted.   Cardiovascular: Normal heart rate noted  Respiratory: Normal respiratory effort, no problems with respiration noted  Abdomen: Soft, gravid, appropriate for gestational age. Pain/Pressure: Present     Pelvic:  Cervical exam deferred      Vtx by Thayer Ohm exam  Extremities: Normal range of motion.  Edema: None  Mental Status: Normal mood and affect. Normal behavior. Normal judgment and thought content.   Urinalysis:      Assessment and Plan:  Pregnancy: G2P0102 at [redacted]w[redacted]d  1. Supervision of other normal pregnancy, antepartum  2. Iron deficiency anemia secondary to inadequate dietary iron intake - Feraheme #2 scheduled for  04-18-2020  3. GBS bacteriuria - Clindamycin resistant   Term labor symptoms and general obstetric precautions including but not limited to vaginal bleeding, contractions, leaking of fluid and fetal movement were reviewed in detail with the patient. Please refer to After Visit Summary for other counseling recommendations.   Return in about 1 week (around 04/20/2020) for ROB.   Brock Bad, MD  04/13/20

## 2020-04-14 ENCOUNTER — Inpatient Hospital Stay (HOSPITAL_COMMUNITY)
Admission: AD | Admit: 2020-04-14 | Discharge: 2020-04-14 | Disposition: A | Discharge status: Home / Self Care | Payer: Medicaid Other | Attending: Obstetrics and Gynecology | Admitting: Obstetrics and Gynecology

## 2020-04-14 ENCOUNTER — Other Ambulatory Visit: Payer: Self-pay

## 2020-04-14 ENCOUNTER — Encounter (HOSPITAL_COMMUNITY): Payer: Self-pay | Admitting: Obstetrics and Gynecology

## 2020-04-14 DIAGNOSIS — O479 False labor, unspecified: Secondary | ICD-10-CM | POA: Insufficient documentation

## 2020-04-14 DIAGNOSIS — Z0371 Encounter for suspected problem with amniotic cavity and membrane ruled out: Secondary | ICD-10-CM

## 2020-04-14 DIAGNOSIS — Z3A37 37 weeks gestation of pregnancy: Secondary | ICD-10-CM | POA: Diagnosis not present

## 2020-04-14 LAB — POCT FERN TEST: POCT Fern Test: NEGATIVE

## 2020-04-14 NOTE — Discharge Instructions (Signed)
Things to Try After 37 weeks to Encourage Labor/Get Ready for Labor:   1.  Try the Miles Circuit at www.milescircuit.com daily to improve baby's position and encourage the onset of labor.  2. Walk a little and rest a little every day.  Change positions often.  3. Cervical Ripening: May try one or both a. Red Raspberry Leaf capsules or tea:  two 300mg or 400mg tablets with each meal, 2-3 times a day, or 1-3 cups of tea daily  Potential Side Effects Of Raspberry Leaf:  Most women do not experience any side effects from drinking raspberry leaf tea. However, nausea and loose stools are possible   b. Evening Primrose Oil capsules: may take 1 to 3 capsules daily. Take 1-2 capsules by mouth each day and place one capsule vaginally at night.  You may also prick the vaginal capsule to release the oil prior to inserting in the vagina. Some of the potential side effects:  Upset stomach  Loose stools or diarrhea  Headaches  Nausea  4. Sex (and especially sex with orgasm) can also help the cervix ripen and encourage labor onset.    Reasons to return to MAU at [facility]:  1.  Contractions are  5 minutes apart or less, each last 1 minute, these have been going on for 1-2 hours, and you cannot walk or talk during them 2.  You have a large gush of fluid, or a trickle of fluid that will not stop and you have to wear a pad 3.  You have bleeding that is bright red, heavier than spotting--like menstrual bleeding (spotting can be normal in early labor or after a check of your cervix) 4.  You do not feel the baby moving like he/she normally does 

## 2020-04-14 NOTE — MAU Provider Note (Signed)
First Provider Initiated Contact with Patient 04/14/20 2031       S: Jacqueline [name] is a 19 y.o. G2P0102 at [redacted]w[redacted]d  who presents to MAU today complaining of leaking of fluid since 2-3 days ago. She denies vaginal bleeding. She endorses contractions. She reports normal fetal movement.    O: BP 122/69   Pulse (!) 106   Temp 99 F (37.2 C)   Resp 19   Wt 71.8 kg   LMP 07/07/2019   BMI 28.05 kg/m  GENERAL: Well-developed, well-nourished female in no acute distress.  HEAD: Normocephalic, atraumatic.  CHEST: Normal effort of breathing, regular heart rate ABDOMEN: Soft, nontender, gravid PELVIC: Normal external female genitalia. Vagina is pink and rugated. Cervix with normal contour, no lesions. Normal discharge.  Negative pooling.   Cervical exam:  Dilation: 3 Effacement (%): 50 Presentation: Vertex   Fetal Monitoring: Baseline: 145 Variability: moderate Accelerations: present Decelerations: none Contractions: Q 3-6 minutes, irregular, mild to palpation   Ferning negative  No results found for this or any previous visit (from the past 24 hour(s)).   A: [facility] at [redacted]w[redacted]d  Membranes intact  P: No evidence of ROM, pt with contractions but cervix unchanged, given option of staying x 1 hour for reevaluation of labor or discharge now and return with increased contractions. Pt prefers discharge at this time.  Kendell Bane 04/14/2020 8:51 PM

## 2020-04-14 NOTE — MAU Note (Signed)
Patient states she has been leaking since last night around 2030 and is now having contractions 5-10 mins apart.  She is also having right lower back pain that has felt constant.  Denies VB.  Endorses + FM.  Denies complications w/ her pregnancy.

## 2020-04-18 ENCOUNTER — Encounter (HOSPITAL_COMMUNITY): Payer: Self-pay | Admitting: Obstetrics and Gynecology

## 2020-04-18 ENCOUNTER — Encounter (HOSPITAL_COMMUNITY)
Admission: RE | Admit: 2020-04-18 | Discharge: 2020-04-18 | Disposition: A | Discharge status: Home / Self Care | Payer: Medicaid Other | Source: Ambulatory Visit | Attending: Obstetrics and Gynecology | Admitting: Obstetrics and Gynecology

## 2020-04-18 ENCOUNTER — Inpatient Hospital Stay (HOSPITAL_COMMUNITY)
Admission: AD | Admit: 2020-04-18 | Discharge: 2020-04-18 | Disposition: A | Discharge status: Home / Self Care | Payer: Medicaid Other | Attending: Obstetrics and Gynecology | Admitting: Obstetrics and Gynecology

## 2020-04-18 ENCOUNTER — Other Ambulatory Visit: Payer: Self-pay

## 2020-04-18 DIAGNOSIS — O26893 Other specified pregnancy related conditions, third trimester: Secondary | ICD-10-CM | POA: Insufficient documentation

## 2020-04-18 DIAGNOSIS — O99891 Other specified diseases and conditions complicating pregnancy: Secondary | ICD-10-CM

## 2020-04-18 DIAGNOSIS — T8090XA Unspecified complication following infusion and therapeutic injection, initial encounter: Secondary | ICD-10-CM

## 2020-04-18 DIAGNOSIS — Z3A38 38 weeks gestation of pregnancy: Secondary | ICD-10-CM | POA: Diagnosis not present

## 2020-04-18 DIAGNOSIS — Z3689 Encounter for other specified antenatal screening: Secondary | ICD-10-CM | POA: Diagnosis not present

## 2020-04-18 DIAGNOSIS — Z888 Allergy status to other drugs, medicaments and biological substances status: Secondary | ICD-10-CM | POA: Insufficient documentation

## 2020-04-18 DIAGNOSIS — O99019 Anemia complicating pregnancy, unspecified trimester: Secondary | ICD-10-CM | POA: Diagnosis not present

## 2020-04-18 DIAGNOSIS — T454X5A Adverse effect of iron and its compounds, initial encounter: Secondary | ICD-10-CM

## 2020-04-18 LAB — URINALYSIS, ROUTINE W REFLEX MICROSCOPIC
Bilirubin Urine: NEGATIVE
Glucose, UA: NEGATIVE mg/dL
Hgb urine dipstick: NEGATIVE
Ketones, ur: NEGATIVE mg/dL
Nitrite: NEGATIVE
Protein, ur: NEGATIVE mg/dL
Specific Gravity, Urine: 1.004 — ABNORMAL LOW (ref 1.005–1.030)
pH: 6 (ref 5.0–8.0)

## 2020-04-18 LAB — CBC WITH DIFFERENTIAL/PLATELET
Abs Immature Granulocytes: 0.03 10*3/uL (ref 0.00–0.07)
Basophils Absolute: 0 10*3/uL (ref 0.0–0.1)
Basophils Relative: 0 %
Eosinophils Absolute: 0.1 10*3/uL (ref 0.0–0.5)
Eosinophils Relative: 1 %
HCT: 30.7 % — ABNORMAL LOW (ref 36.0–46.0)
Hemoglobin: 9.8 g/dL — ABNORMAL LOW (ref 12.0–15.0)
Immature Granulocytes: 0 %
Lymphocytes Relative: 14 %
Lymphs Abs: 1 10*3/uL (ref 0.7–4.0)
MCH: 25.5 pg — ABNORMAL LOW (ref 26.0–34.0)
MCHC: 31.9 g/dL (ref 30.0–36.0)
MCV: 79.9 fL — ABNORMAL LOW (ref 80.0–100.0)
Monocytes Absolute: 0.4 10*3/uL (ref 0.1–1.0)
Monocytes Relative: 5 %
Neutro Abs: 5.9 10*3/uL (ref 1.7–7.7)
Neutrophils Relative %: 80 %
Platelets: 278 10*3/uL (ref 150–400)
RBC: 3.84 MIL/uL — ABNORMAL LOW (ref 3.87–5.11)
RDW: 16.7 % — ABNORMAL HIGH (ref 11.5–15.5)
WBC: 7.4 10*3/uL (ref 4.0–10.5)
nRBC: 0 % (ref 0.0–0.2)

## 2020-04-18 LAB — COMPREHENSIVE METABOLIC PANEL
ALT: 12 U/L (ref 0–44)
AST: 18 U/L (ref 15–41)
Albumin: 2.4 g/dL — ABNORMAL LOW (ref 3.5–5.0)
Alkaline Phosphatase: 134 U/L — ABNORMAL HIGH (ref 38–126)
Anion gap: 10 (ref 5–15)
BUN: <5 mg/dL — ABNORMAL LOW (ref 6–20)
CO2: 21 mmol/L — ABNORMAL LOW (ref 22–32)
Calcium: 8.5 mg/dL — ABNORMAL LOW (ref 8.9–10.3)
Chloride: 103 mmol/L (ref 98–111)
Creatinine, Ser: 0.58 mg/dL (ref 0.44–1.00)
GFR, Estimated: >60 mL/min (ref >60)
Glucose, Bld: 85 mg/dL (ref 70–99)
Potassium: 3.7 mmol/L (ref 3.5–5.1)
Sodium: 134 mmol/L — ABNORMAL LOW (ref 135–145)
Total Bilirubin: 0.8 mg/dL (ref 0.3–1.2)
Total Protein: 6.2 g/dL — ABNORMAL LOW (ref 6.5–8.1)

## 2020-04-18 LAB — CK: Total CK: 62 U/L (ref 38–234)

## 2020-04-18 MED ORDER — ONDANSETRON HCL 4 MG/2ML IJ SOLN
INTRAMUSCULAR | Status: AC
  Filled 2020-04-18: qty 2

## 2020-04-18 MED ORDER — OXYCODONE-ACETAMINOPHEN 5-325 MG PO TABS
1.0000 | ORAL_TABLET | Freq: Once | ORAL | Status: AC
  Administered 2020-04-18: 1 via ORAL
  Filled 2020-04-18: qty 1

## 2020-04-18 MED ORDER — SODIUM CHLORIDE 0.9 % IV SOLN: Freq: Once | INTRAVENOUS | Status: AC

## 2020-04-18 MED ORDER — ONDANSETRON HCL 4 MG/2ML IJ SOLN
4.0000 mg | Freq: Once | INTRAMUSCULAR | Status: AC
  Administered 2020-04-18: 4 mg via INTRAVENOUS

## 2020-04-18 MED ORDER — SODIUM CHLORIDE 0.9 % IV SOLN
510.0000 mg | INTRAVENOUS | Status: DC
  Administered 2020-04-18: 510 mg via INTRAVENOUS
  Filled 2020-04-18: qty 510

## 2020-04-18 NOTE — MAU Provider Note (Signed)
History     CSN: [id number]  Arrival date and time: 04/18/20 1301   First Provider Initiated Contact with Patient 04/18/20 1331      Chief Complaint  Patient presents with  . Allergic Reaction   Jacqueline [name] is a 19 y.o. G2P0102 at [redacted]w[redacted]d who presents to MAU for feraheme reactions. Patient was receiving her second feraheme infusion this morning and after the administration of the medication started experiencing dizziness, headache, back pain and nausea without vomiting immediately after the infusion finished. Patient reports she was given a bolus of fluid and Zofran, which helped. Patient reports the dizziness and headache along with nausea have resolved with fluids and Zofran. Patient also reports her back pain is still present and rates it as 9/10. Patient's biggest concern at this time is her bilateral leg pain. Patient reports the majority of her pain is in her quadriceps muscles. Patient reports this same reaction occurred after her second blood transfusion after her first delivery 10/2018.  Pt denies VB, LOF, ctx, decreased FM, vaginal discharge/odor/itching. Pt denies abdominal pain, constipation, diarrhea, or urinary problems. Pt denies fever, chills, fatigue, sweating or changes in appetite. Pt denies SOB or chest pain. Pt denies light-headedness, weakness.  Problems this pregnancy include: none. Allergies? Feraheme, tomatoes Current medications/supplements? Low dose ASA, PNVs Prenatal care provider? [facility], next appt 04/20/2020   OB History    Gravida  2   Para  1   Term  0   Preterm  1   AB  0   Living  2     SAB  0   TAB  0   Ectopic  0   Multiple  1   Live Births  2           Past Medical History:  Diagnosis Date  . Asthma   . Migraines   . Pregnancy induced hypertension     Past Surgical History:  Procedure Laterality Date  . WISDOM TOOTH EXTRACTION      Family History  Problem Relation Age of Onset  . Hypertension Mother    . Asthma Brother   . Hypertension Brother   . Diabetes Brother   . Hypertension Maternal Aunt   . Diabetes Maternal Aunt   . Autism spectrum disorder Maternal Aunt   . Hypertension Maternal Grandmother   . Aneurysm Maternal Uncle     Social History   Tobacco Use  . Smoking status: Never Smoker  . Smokeless tobacco: Never Used  Vaping Use  . Vaping Use: Former  Substance Use Topics  . Alcohol use: Never    Alcohol/week: 0.0 standard drinks  . Drug use: Not Currently    Types: Marijuana    Comment: NOT SMOKED DURING PREG    Allergies:  Allergies  Allergen Reactions  . Feraheme [Ferumoxytol] Other (See Comments)    Nausea, back pain, dizziness  . Tomato Other (See Comments)    Makes asthma worse    Medications Prior to Admission  Medication Sig Dispense Refill Last Dose  . aspirin EC 81 MG tablet Take 1 tablet (81 mg total) by mouth daily. Take after 12 weeks for prevention of preeclampsia later in pregnancy. Start taking on May 18th 300 tablet 0 04/17/2020 at Unknown time  . Prenatal Vit-Fe Fumarate-FA (MULTIVITAMIN-PRENATAL) 27-0.8 MG TABS tablet Take 1 tablet by mouth daily at 12 noon.   04/18/2020 at Unknown time  . Butalbital-APAP-Caffeine 50-325-40 MG capsule Take 1-2 capsules by mouth every 6 (six) hours as needed for  headache. (Patient not taking: Reported on 04/13/2020) 30 capsule 0   . cyclobenzaprine (FLEXERIL) 5 MG tablet Take 1 tablet (5 mg total) by mouth 3 (three) times daily as needed for muscle spasms. 20 tablet 0 More than a month at Unknown time  . Elastic Bandages & Supports (COMFORT FIT MATERNITY SUPP MED) MISC 1 Device by Does not apply route daily. (Patient not taking: Reported on 04/13/2020) 1 each 0   . famotidine (PEPCID) 20 MG tablet Take 1 tablet (20 mg total) by mouth 2 (two) times daily. 60 tablet 0   . ferumoxytol (FERAHEME) 510 MG/17ML SOLN injection Inject 17 mLs (510 mg total) into the vein once a week for 2 doses. 34 mL 0   . metoCLOPramide  (REGLAN) 10 MG tablet Take 1 tablet (10 mg total) by mouth every 8 (eight) hours as needed for nausea. (Patient not taking: Reported on 04/13/2020) 30 tablet 0   . ondansetron (ZOFRAN ODT) 4 MG disintegrating tablet Take 1 tablet (4 mg total) by mouth every 6 (six) hours as needed for nausea. (Patient not taking: Reported on 04/13/2020) 20 tablet 0     Review of Systems  Constitutional: Negative for chills, diaphoresis, fatigue and fever.  Eyes: Negative for visual disturbance.  Respiratory: Negative for shortness of breath.   Cardiovascular: Negative for chest pain.  Gastrointestinal: Positive for nausea. Negative for abdominal pain, constipation, diarrhea and vomiting.  Genitourinary: Negative for dysuria, flank pain, frequency, pelvic pain, urgency, vaginal bleeding and vaginal discharge.  Musculoskeletal: Positive for arthralgias and back pain.  Neurological: Positive for dizziness and headaches. Negative for weakness and light-headedness.   Physical Exam   Blood pressure 112/72, pulse 69, temperature 97.6 F (36.4 C), temperature source Oral, resp. rate 15, last menstrual period 07/07/2019, SpO2 100 %, currently breastfeeding.  Patient Vitals for the past 24 hrs:  BP Temp Temp src Pulse Resp SpO2  04/18/20 1659 112/72 -- -- 69 15 100 %  04/18/20 1317 116/63 97.6 F (36.4 C) Oral 77 15 100 %   Physical Exam Vitals and nursing note reviewed.  Constitutional:      General: She is not in acute distress.    Appearance: Normal appearance. She is normal weight. She is not ill-appearing, toxic-appearing or diaphoretic.  HENT:     Head: Normocephalic and atraumatic.  Pulmonary:     Effort: Pulmonary effort is normal.  Musculoskeletal:        General: Tenderness present. Normal range of motion.     Comments: Patient able to perform full range of motion of all lower extremity joints, but not without assistance or pain. Patient localizes pain mostly to quadriceps muscles on  movement/palpation.  Neurological:     General: No focal deficit present.     Mental Status: She is alert and oriented to person, place, and time.     Cranial Nerves: No facial asymmetry.     Motor: No weakness, tremor, atrophy or abnormal muscle tone.     Gait: Gait is intact.  Psychiatric:        Mood and Affect: Mood normal.        Behavior: Behavior normal.        Thought Content: Thought content normal.        Judgment: Judgment normal.    Results for orders placed or performed during the hospital encounter of 04/18/20 (from the past 24 hour(s))  Urinalysis, Routine w reflex microscopic Urine, Clean Catch     Status: Abnormal   Collection  Time: 04/18/20  2:10 PM  Result Value Ref Range   Color, Urine YELLOW YELLOW   APPearance HAZY (A) CLEAR   Specific Gravity, Urine 1.004 (L) 1.005 - 1.030   pH 6.0 5.0 - 8.0   Glucose, UA NEGATIVE NEGATIVE mg/dL   Hgb urine dipstick NEGATIVE NEGATIVE   Bilirubin Urine NEGATIVE NEGATIVE   Ketones, ur NEGATIVE NEGATIVE mg/dL   Protein, ur NEGATIVE NEGATIVE mg/dL   Nitrite NEGATIVE NEGATIVE   Leukocytes,Ua LARGE (A) NEGATIVE   RBC / HPF 0-5 0 - 5 RBC/hpf   WBC, UA 21-50 0 - 5 WBC/hpf   Bacteria, UA MANY (A) NONE SEEN   Squamous Epithelial / LPF 0-5 0 - 5  CBC with Differential/Platelet     Status: Abnormal   Collection Time: 04/18/20  2:17 PM  Result Value Ref Range   WBC 7.4 4.0 - 10.5 K/uL   RBC 3.84 (L) 3.87 - 5.11 MIL/uL   Hemoglobin 9.8 (L) 12.0 - 15.0 g/dL   HCT 12.4 (L) 36 - 46 %   MCV 79.9 (L) 80.0 - 100.0 fL   MCH 25.5 (L) 26.0 - 34.0 pg   MCHC 31.9 30.0 - 36.0 g/dL   RDW 58.0 (H) 99.8 - 33.8 %   Platelets 278 150 - 400 K/uL   nRBC 0.0 0.0 - 0.2 %   Neutrophils Relative % 80 %   Neutro Abs 5.9 1.7 - 7.7 K/uL   Lymphocytes Relative 14 %   Lymphs Abs 1.0 0.7 - 4.0 K/uL   Monocytes Relative 5 %   Monocytes Absolute 0.4 0.1 - 1.0 K/uL   Eosinophils Relative 1 %   Eosinophils Absolute 0.1 0.0 - 0.5 K/uL   Basophils  Relative 0 %   Basophils Absolute 0.0 0.0 - 0.1 K/uL   Immature Granulocytes 0 %   Abs Immature Granulocytes 0.03 0.00 - 0.07 K/uL  Comprehensive metabolic panel     Status: Abnormal   Collection Time: 04/18/20  2:17 PM  Result Value Ref Range   Sodium 134 (L) 135 - 145 mmol/L   Potassium 3.7 3.5 - 5.1 mmol/L   Chloride 103 98 - 111 mmol/L   CO2 21 (L) 22 - 32 mmol/L   Glucose, Bld 85 70 - 99 mg/dL   BUN <5 (L) 6 - 20 mg/dL   Creatinine, Ser 2.50 0.44 - 1.00 mg/dL   Calcium 8.5 (L) 8.9 - 10.3 mg/dL   Total Protein 6.2 (L) 6.5 - 8.1 g/dL   Albumin 2.4 (L) 3.5 - 5.0 g/dL   AST 18 15 - 41 U/L   ALT 12 0 - 44 U/L   Alkaline Phosphatase 134 (H) 38 - 126 U/L   Total Bilirubin 0.8 0.3 - 1.2 mg/dL   GFR, Estimated >53 >97 mL/min   Anion gap 10 5 - 15  CK     Status: None   Collection Time: 04/18/20  2:17 PM  Result Value Ref Range   Total CK 62 38.0 - 234.0 U/L   No results found.  MAU Course  Procedures  MDM -pt sent for further evaluation from infusion center after infusion finished and she experienced nausea, dizziness, headache, back pain; was given a bolus of fluid and Zofran in infusion center, ordered by Dr. Adrian Blackwater -consulted with Dr. Vergie Living who recommends PO fluids, Percocet in hospital, UA, CBC, CMP, CK; if discharged home, pt can take 25mg  Benadryl BID and q6hr Tylenol for 1-2 days -UA: hazy/SG 1.004/lg leuks/many bacteria, sending urine for culture -  CBC: H/H 9.8/30.7 - pt is s/p feraheme infusion -CMP: no abnormalities requiring treatment -CK: WNL (62) -EFM: reactive       -baseline: 130/120       -variability: moderate       -accels: present, 15x15       -decels: absent       -TOCO: quiet -upon reassessment, pt reports symptoms have resolved and patient is able to get in and out of bed without assistance and move limbs through full ROM without pain -pt discharged to home in stable condition  Orders Placed This Encounter  Procedures  . Culture, OB Urine     Standing Status:   Standing    Number of Occurrences:   1  . CBC with Differential/Platelet    Standing Status:   Standing    Number of Occurrences:   1  . Comprehensive metabolic panel    Standing Status:   Standing    Number of Occurrences:   1  . CK    Standing Status:   Standing    Number of Occurrences:   1  . Urinalysis, Routine w reflex microscopic    Standing Status:   Standing    Number of Occurrences:   1  . Discharge patient    Order Specific Question:   Discharge disposition    Answer:   01-Home or Self Care [1]    Order Specific Question:   Discharge patient date    Answer:   04/18/2020    Meds ordered this encounter  Medications  . oxyCODONE-acetaminophen (PERCOCET/ROXICET) 5-325 MG per tablet 1 tablet    Assessment and Plan   1. Infusion reaction, initial encounter   2. [redacted] weeks gestation of pregnancy   3. NST (non-stress test) reactive     Allergies as of 04/18/2020      Reactions   Feraheme [ferumoxytol] Other (See Comments)   Nausea, back pain, dizziness   Tomato Other (See Comments)   Makes asthma worse      Medication List    STOP taking these medications   ferumoxytol 510 MG/17ML Soln injection Commonly known as: FERAHEME     TAKE these medications   aspirin EC 81 MG tablet Take 1 tablet (81 mg total) by mouth daily. Take after 12 weeks for prevention of preeclampsia later in pregnancy. Start taking on May 18th   Butalbital-APAP-Caffeine 50-325-40 MG capsule Take 1-2 capsules by mouth every 6 (six) hours as needed for headache.   Comfort Fit Maternity Supp Med Misc 1 Device by Does not apply route daily.   cyclobenzaprine 5 MG tablet Commonly known as: FLEXERIL Take 1 tablet (5 mg total) by mouth 3 (three) times daily as needed for muscle spasms.   famotidine 20 MG tablet Commonly known as: PEPCID Take 1 tablet (20 mg total) by mouth 2 (two) times daily.   metoCLOPramide 10 MG tablet Commonly known as: REGLAN Take 1 tablet (10  mg total) by mouth every 8 (eight) hours as needed for nausea.   multivitamin-prenatal 27-0.8 MG Tabs tablet Take 1 tablet by mouth daily at 12 noon.   ondansetron 4 MG disintegrating tablet Commonly known as: Zofran ODT Take 1 tablet (4 mg total) by mouth every 6 (six) hours as needed for nausea.       -will call with culture results, if positive -discussed home use of Tylenol and Benadryl and timing of administration for continuing symptoms -return MAU precautions given -pt discharged to home in stable condition  Joni ReiningNicole  E Vernis Cabacungan 04/18/2020, 5:10 PM

## 2020-04-18 NOTE — MAU Note (Signed)
.   [place] Rosenau is a 19 y.o. at [redacted]w[redacted]d here in MAU reporting: Patient had ferraheme infusion this morning at 0900. Had an infusion reaction shortly after. She states that she had a headache and chills. Now she is having numbness and pain in her legs when she stands that she rates 10/10. She is also having lower back pain that is 9/10. Received 4mg  of zofran and 1L bolus of NS. No VB or LOF. Endorses good fetal movement.    Vitals:   04/18/20 1317  BP: 116/63  Pulse: 77  Resp: 15  Temp: 97.6 F (36.4 C)  SpO2: 100%     FHT:135

## 2020-04-18 NOTE — Discharge Instructions (Signed)

## 2020-04-18 NOTE — Progress Notes (Addendum)
Checking on the patient at 1050 she tells me she is not feeling well. States back pain, nausea and dizziness.  Second dose of feraheme was completed at 1038. VS taken with blood pressure slightly lower that admission at 96/69 pulse 96 O2 sat at 100. 1 L NS started at 1055. Call placed to MD. Orders received at 1105 and Zofran given as ordered.  Fetal heart rate at 140 at 1057.  No emesis at this time but pt remains nauseous and dizzy.   1148 BP 91/66, P77, O2 sat 99. Pt states nausea is slightly better but is very sleepy at this point. Will continue to monitor

## 2020-04-19 ENCOUNTER — Telehealth: Payer: Self-pay

## 2020-04-19 NOTE — Telephone Encounter (Signed)
Return patient called she reports being in pain since coming home from the ER. They advised her to take Tyenlol but it is not working. I have advised her to go back to the ER for follow up care.

## 2020-04-20 ENCOUNTER — Ambulatory Visit (INDEPENDENT_AMBULATORY_CARE_PROVIDER_SITE_OTHER): Payer: Medicaid Other | Admitting: Obstetrics

## 2020-04-20 ENCOUNTER — Encounter: Payer: Self-pay | Admitting: Obstetrics

## 2020-04-20 ENCOUNTER — Other Ambulatory Visit: Payer: Self-pay

## 2020-04-20 VITALS — BP 132/63 | HR 88 | Wt 163.0 lb

## 2020-04-20 DIAGNOSIS — R8271 Bacteriuria: Secondary | ICD-10-CM

## 2020-04-20 DIAGNOSIS — Z3480 Encounter for supervision of other normal pregnancy, unspecified trimester: Secondary | ICD-10-CM

## 2020-04-20 DIAGNOSIS — D508 Other iron deficiency anemias: Secondary | ICD-10-CM

## 2020-04-20 LAB — CULTURE, OB URINE: Culture: >=100000 — AB

## 2020-04-20 NOTE — Progress Notes (Signed)
Subjective:  [name] is a 19 y.o. G2P0102 at [redacted]w[redacted]d being seen today for ongoing prenatal care.  She is currently monitored for the following issues for this low-risk pregnancy and has Supervision of other normal pregnancy, antepartum; Hx of preeclampsia, prior pregnancy, currently pregnant; Hx of postpartum hemorrhage, currently pregnant; Short interval between pregnancies complicating pregnancy, antepartum; Chlamydia trachomatis infection during pregnancy in first trimester; Current nicotine use; Nausea and vomiting during pregnancy prior to [redacted] weeks gestation; and Pyelonephritis affecting pregnancy in second trimester on their problem list.  Patient reports occasional contractions.  Contractions: Irregular. Vag. Bleeding: None.  Movement: Present. Denies leaking of fluid.   The following portions of the patient's history were reviewed and updated as appropriate: allergies, current medications, past family history, past medical history, past social history, past surgical history and problem list. Problem list updated.  Objective:   Vitals:   04/20/20 1051  BP: 132/63  Pulse: 88  Weight: 163 lb (73.9 kg)    Fetal Status:     Movement: Present     General:  Alert, oriented and cooperative. Patient is in no acute distress.  Skin: Skin is warm and dry. No rash noted.   Cardiovascular: Normal heart rate noted  Respiratory: Normal respiratory effort, no problems with respiration noted  Abdomen: Soft, gravid, appropriate for gestational age. Pain/Pressure: Present     Pelvic:  Cervical exam performed      Cvx: 2-3 cm/ 50% / -1 / Vtx  Extremities: Normal range of motion.  Edema: Trace  Mental Status: Normal mood and affect. Normal behavior. Normal judgment and thought content.   Urinalysis:      Assessment and Plan:  Pregnancy: G2P0102 at [redacted]w[redacted]d  1. Supervision of other normal pregnancy, antepartum  2. GBS bacteriuria, Clindamycin resistent - treat in labor  3. Iron deficiency  anemia secondary to inadequate dietary iron intake   Term labor symptoms and general obstetric precautions including but not limited to vaginal bleeding, contractions, leaking of fluid and fetal movement were reviewed in detail with the patient. Please refer to After Visit Summary for other counseling recommendations.   Return in about 1 week (around 04/27/2020) for MyChart.   Brock Bad, MD  04/20/20

## 2020-04-20 NOTE — Progress Notes (Signed)
Having a lot of pelvic pressure, sharp pains in vaginal area.  Requesting cervix check.

## 2020-04-21 ENCOUNTER — Telehealth: Payer: Self-pay | Admitting: Advanced Practice Midwife

## 2020-04-21 DIAGNOSIS — O2343 Unspecified infection of urinary tract in pregnancy, third trimester: Secondary | ICD-10-CM

## 2020-04-21 MED ORDER — CEFADROXIL 500 MG PO CAPS: 500.0000 mg | ORAL_CAPSULE | Freq: Two times a day (BID) | ORAL | 0 refills | Status: DC

## 2020-04-21 NOTE — Telephone Encounter (Signed)
Called patient to notify her of positive urine culture for UTI during her MAU visit on 04/18/20. Rx sent for Duricef 500 mg BID x 7 days.  Pt states understanding and to pick up today.  During call, pt reported swelling of her hands and feet and BP at home of 130s/60s.  No h/a, visual changes, or epigastric pain.  Pt taking baby ASA daily due to hx HTN in prior pregnancy.  Reviewed s/sx of PEC with pt, reasons to seek care. Pt to take BP at home over the weekend, if elevated x 2 or if symptoms of PEC, pt to come to MAU for evaluation. Next appt 04/26/20, in the office.

## 2020-04-22 ENCOUNTER — Other Ambulatory Visit: Payer: Self-pay

## 2020-04-22 ENCOUNTER — Inpatient Hospital Stay (HOSPITAL_COMMUNITY)
Admission: AD | Admit: 2020-04-22 | Discharge: 2020-04-23 | Disposition: A | Discharge status: Home / Self Care | Payer: Medicaid Other | Attending: Obstetrics and Gynecology | Admitting: Obstetrics and Gynecology

## 2020-04-22 DIAGNOSIS — Z79899 Other long term (current) drug therapy: Secondary | ICD-10-CM | POA: Insufficient documentation

## 2020-04-22 DIAGNOSIS — O26893 Other specified pregnancy related conditions, third trimester: Secondary | ICD-10-CM

## 2020-04-22 DIAGNOSIS — Z7982 Long term (current) use of aspirin: Secondary | ICD-10-CM | POA: Insufficient documentation

## 2020-04-22 DIAGNOSIS — R110 Nausea: Secondary | ICD-10-CM

## 2020-04-22 DIAGNOSIS — G43009 Migraine without aura, not intractable, without status migrainosus: Secondary | ICD-10-CM | POA: Insufficient documentation

## 2020-04-22 DIAGNOSIS — O99353 Diseases of the nervous system complicating pregnancy, third trimester: Secondary | ICD-10-CM | POA: Insufficient documentation

## 2020-04-22 DIAGNOSIS — Z888 Allergy status to other drugs, medicaments and biological substances status: Secondary | ICD-10-CM | POA: Diagnosis not present

## 2020-04-22 DIAGNOSIS — Z3A38 38 weeks gestation of pregnancy: Secondary | ICD-10-CM | POA: Diagnosis not present

## 2020-04-22 LAB — CBC WITH DIFFERENTIAL/PLATELET
Abs Immature Granulocytes: 0.07 10*3/uL (ref 0.00–0.07)
Basophils Absolute: 0 10*3/uL (ref 0.0–0.1)
Basophils Relative: 0 %
Eosinophils Absolute: 0.1 10*3/uL (ref 0.0–0.5)
Eosinophils Relative: 1 %
HCT: 30.8 % — ABNORMAL LOW (ref 36.0–46.0)
Hemoglobin: 9.9 g/dL — ABNORMAL LOW (ref 12.0–15.0)
Immature Granulocytes: 1 %
Lymphocytes Relative: 15 %
Lymphs Abs: 1.6 10*3/uL (ref 0.7–4.0)
MCH: 26.3 pg (ref 26.0–34.0)
MCHC: 32.1 g/dL (ref 30.0–36.0)
MCV: 81.9 fL (ref 80.0–100.0)
Monocytes Absolute: 0.5 10*3/uL (ref 0.1–1.0)
Monocytes Relative: 5 %
Neutro Abs: 8.2 10*3/uL — ABNORMAL HIGH (ref 1.7–7.7)
Neutrophils Relative %: 78 %
Platelets: 290 10*3/uL (ref 150–400)
RBC: 3.76 MIL/uL — ABNORMAL LOW (ref 3.87–5.11)
RDW: 18.4 % — ABNORMAL HIGH (ref 11.5–15.5)
WBC: 10.5 10*3/uL (ref 4.0–10.5)
nRBC: 0 % (ref 0.0–0.2)

## 2020-04-22 LAB — COMPREHENSIVE METABOLIC PANEL
ALT: 12 U/L (ref 0–44)
AST: 17 U/L (ref 15–41)
Albumin: 2.4 g/dL — ABNORMAL LOW (ref 3.5–5.0)
Alkaline Phosphatase: 139 U/L — ABNORMAL HIGH (ref 38–126)
Anion gap: 10 (ref 5–15)
BUN: <5 mg/dL — ABNORMAL LOW (ref 6–20)
CO2: 20 mmol/L — ABNORMAL LOW (ref 22–32)
Calcium: 8.6 mg/dL — ABNORMAL LOW (ref 8.9–10.3)
Chloride: 105 mmol/L (ref 98–111)
Creatinine, Ser: 0.5 mg/dL (ref 0.44–1.00)
GFR, Estimated: >60 mL/min (ref >60)
Glucose, Bld: 79 mg/dL (ref 70–99)
Potassium: 3.4 mmol/L — ABNORMAL LOW (ref 3.5–5.1)
Sodium: 135 mmol/L (ref 135–145)
Total Bilirubin: 0.7 mg/dL (ref 0.3–1.2)
Total Protein: 5.8 g/dL — ABNORMAL LOW (ref 6.5–8.1)

## 2020-04-22 MED ORDER — LACTATED RINGERS IV BOLUS
1000.0000 mL | Freq: Once | INTRAVENOUS | Status: AC
  Administered 2020-04-22: 1000 mL via INTRAVENOUS

## 2020-04-22 MED ORDER — DIPHENHYDRAMINE HCL 50 MG/ML IJ SOLN
12.5000 mg | Freq: Once | INTRAMUSCULAR | Status: AC
  Administered 2020-04-22: 12.5 mg via INTRAVENOUS
  Filled 2020-04-22: qty 1

## 2020-04-22 MED ORDER — METOCLOPRAMIDE HCL 5 MG/ML IJ SOLN
10.0000 mg | Freq: Once | INTRAMUSCULAR | Status: AC
  Administered 2020-04-22: 10 mg via INTRAVENOUS
  Filled 2020-04-22: qty 2

## 2020-04-22 NOTE — MAU Provider Note (Addendum)
History     CSN: [id number]  Arrival date and time: 04/22/20 2004   First Provider Initiated Contact with Patient 04/22/20 2138      Chief Complaint  Patient presents with  . Headache   Jacqueline [name] is a 19 y.o. G2P0102 at [redacted]w[redacted]d who presents to MAU for headache. Patient reports she has had migraines in the past, but this one feels worse.  Onset: 4PM today Location: whole head Duration: ~5hours, progressively worsening Character: "feels like someone hit me in the head" Aggravating/Associated: light/nausea and vomiting x2 since start of HA Relieving: none Treatment: Tylenol 1000mg , did not work Severity: 10/10  Pt denies VB, LOF, ctx, decreased FM, vaginal discharge/odor/itching. Pt denies abdominal pain, constipation, diarrhea, or urinary problems. Pt denies fever, chills, fatigue, sweating or changes in appetite. Pt denies SOB or chest pain. Pt denies dizziness, light-headedness, weakness.  Problems this pregnancy include: anemia, hx preE. Allergies? Feraheme, tomato Current medications/supplements? PNVs, low dose ASA, Fioricet PRN (did not take today), Duricef, Flexeril (last took today 8AM) Prenatal care provider? [facility], next appt 04/26/2020   OB History    Gravida  2   Para  1   Term  0   Preterm  1   AB  0   Living  2     SAB  0   TAB  0   Ectopic  0   Multiple  1   Live Births  2           Past Medical History:  Diagnosis Date  . Asthma   . Migraines   . Pregnancy induced hypertension     Past Surgical History:  Procedure Laterality Date  . WISDOM TOOTH EXTRACTION      Family History  Problem Relation Age of Onset  . Hypertension Mother   . Asthma Brother   . Hypertension Brother   . Diabetes Brother   . Hypertension Maternal Aunt   . Diabetes Maternal Aunt   . Autism spectrum disorder Maternal Aunt   . Hypertension Maternal Grandmother   . Aneurysm Maternal Uncle     Social History   Tobacco Use  . Smoking  status: Never Smoker  . Smokeless tobacco: Never Used  Vaping Use  . Vaping Use: Former  Substance Use Topics  . Alcohol use: Never    Alcohol/week: 0.0 standard drinks  . Drug use: Not Currently    Types: Marijuana    Comment: NOT SMOKED DURING PREG    Allergies:  Allergies  Allergen Reactions  . Feraheme [Ferumoxytol] Other (See Comments)    Nausea, back pain, dizziness  . Tomato Other (See Comments)    Makes asthma worse    Medications Prior to Admission  Medication Sig Dispense Refill Last Dose  . aspirin EC 81 MG tablet Take 1 tablet (81 mg total) by mouth daily. Take after 12 weeks for prevention of preeclampsia later in pregnancy. Start taking on May 18th 300 tablet 0 04/22/2020 at Unknown time  . Butalbital-APAP-Caffeine 50-325-40 MG capsule Take 1-2 capsules by mouth every 6 (six) hours as needed for headache. 30 capsule 0 04/22/2020 at Unknown time  . Prenatal Vit-Fe Fumarate-FA (MULTIVITAMIN-PRENATAL) 27-0.8 MG TABS tablet Take 1 tablet by mouth daily at 12 noon.   04/22/2020 at Unknown time  . cefadroxil (DURICEF) 500 MG capsule Take 1 capsule (500 mg total) by mouth 2 (two) times daily for 7 days. 14 capsule 0 Unknown at Unknown time  . cyclobenzaprine (FLEXERIL) 5 MG tablet Take 1  tablet (5 mg total) by mouth 3 (three) times daily as needed for muscle spasms. (Patient not taking: Reported on 04/20/2020) 20 tablet 0 Unknown at Unknown time  . Elastic Bandages & Supports (COMFORT FIT MATERNITY SUPP MED) MISC 1 Device by Does not apply route daily. (Patient not taking: Reported on 04/13/2020) 1 each 0 Unknown at Unknown time  . famotidine (PEPCID) 20 MG tablet Take 1 tablet (20 mg total) by mouth 2 (two) times daily. 60 tablet 0   . metoCLOPramide (REGLAN) 10 MG tablet Take 1 tablet (10 mg total) by mouth every 8 (eight) hours as needed for nausea. (Patient not taking: Reported on 04/13/2020) 30 tablet 0   . ondansetron (ZOFRAN ODT) 4 MG disintegrating tablet Take 1 tablet  (4 mg total) by mouth every 6 (six) hours as needed for nausea. (Patient not taking: Reported on 04/13/2020) 20 tablet 0     Review of Systems  Constitutional: Negative for chills, diaphoresis, fatigue and fever.  Eyes: Negative for visual disturbance.  Respiratory: Negative for shortness of breath.   Cardiovascular: Negative for chest pain.  Gastrointestinal: Positive for nausea and vomiting. Negative for abdominal pain, constipation and diarrhea.  Genitourinary: Negative for dysuria, flank pain, frequency, pelvic pain, urgency, vaginal bleeding and vaginal discharge.  Neurological: Positive for headaches. Negative for dizziness, weakness and light-headedness.   Physical Exam   Blood pressure 128/69, pulse 95, resp. rate 18, last menstrual period 07/07/2019, SpO2 100 %, currently breastfeeding.  Patient Vitals for the past 24 hrs:  BP Pulse Resp SpO2  04/22/20 2041 128/69 95 18 100 %   Physical Exam Vitals and nursing note reviewed.  Constitutional:      General: She is not in acute distress.    Appearance: Normal appearance. She is normal weight. She is not ill-appearing, toxic-appearing or diaphoretic.  HENT:     Head: Normocephalic and atraumatic.  Pulmonary:     Effort: Pulmonary effort is normal.  Neurological:     Mental Status: She is alert and oriented to person, place, and time.  Psychiatric:        Mood and Affect: Mood normal.        Behavior: Behavior normal.        Thought Content: Thought content normal.        Judgment: Judgment normal.    No results found for this or any previous visit (from the past 24 hour(s)).  No results found.  MAU Course  Procedures  MDM -LR + Benadryl + Reglan ordered, pt wishes to hold Decadron at this time d/t flushing side effects -care transferred to J. Eline Geng, NP Nugent, Odie Sera, NP  9:56 PM 04/22/2020  Headache resolved.   Assessment and Plan     A:  1. Migraine without aura and without status migrainosus, not  intractable   2. [redacted] weeks gestation of pregnancy   3. Nausea     P:  DC home  Return to MAU if symptoms worsen  Jacqueline [name], Jacqueline Rutherford, NP 04/23/2020 1:52 AM

## 2020-04-22 NOTE — Discharge Instructions (Signed)

## 2020-04-22 NOTE — MAU Note (Signed)
.   [place] [name] is a 19 y.o. at [redacted]w[redacted]d here in MAU reporting: Headache and blurry vision that started at 1600. She also reports floaters as well, has not had any blood pressure issues with this pregnancy but states she had Pre E with her last baby. No VB or LOF. Reports decreased fetal movement since 1600.  Pain score: 10 Vitals:   04/22/20 2041  BP: 128/69  Pulse: 95  Resp: 18  SpO2: 100%     FHT:159

## 2020-04-23 LAB — URINALYSIS, ROUTINE W REFLEX MICROSCOPIC
Bilirubin Urine: NEGATIVE
Glucose, UA: NEGATIVE mg/dL
Hgb urine dipstick: NEGATIVE
Ketones, ur: NEGATIVE mg/dL
Nitrite: NEGATIVE
Protein, ur: NEGATIVE mg/dL
Specific Gravity, Urine: 1.01 (ref 1.005–1.030)
WBC, UA: >50 WBC/hpf — ABNORMAL HIGH (ref 0–5)
pH: 6 (ref 5.0–8.0)

## 2020-04-26 ENCOUNTER — Encounter: Payer: Self-pay | Admitting: Obstetrics

## 2020-04-26 ENCOUNTER — Other Ambulatory Visit: Payer: Self-pay

## 2020-04-26 ENCOUNTER — Ambulatory Visit (INDEPENDENT_AMBULATORY_CARE_PROVIDER_SITE_OTHER): Payer: Medicaid Other | Admitting: Obstetrics

## 2020-04-26 VITALS — BP 118/68 | HR 91 | Wt 166.8 lb

## 2020-04-26 DIAGNOSIS — O09899 Supervision of other high risk pregnancies, unspecified trimester: Secondary | ICD-10-CM

## 2020-04-26 DIAGNOSIS — O09299 Supervision of pregnancy with other poor reproductive or obstetric history, unspecified trimester: Secondary | ICD-10-CM

## 2020-04-26 DIAGNOSIS — Z3480 Encounter for supervision of other normal pregnancy, unspecified trimester: Secondary | ICD-10-CM

## 2020-04-26 NOTE — Progress Notes (Signed)
Subjective:  [name] is a 19 y.o. G2P0102 at [redacted]w[redacted]d being seen today for ongoing prenatal care.  She is currently monitored for the following issues for this low-risk pregnancy and has Supervision of other normal pregnancy, antepartum; Hx of preeclampsia, prior pregnancy, currently pregnant; Hx of postpartum hemorrhage, currently pregnant; Short interval between pregnancies complicating pregnancy, antepartum; Chlamydia trachomatis infection during pregnancy in first trimester; Current nicotine use; Nausea and vomiting during pregnancy prior to [redacted] weeks gestation; and Pyelonephritis affecting pregnancy in second trimester on their problem list.  Patient reports no complaints.  Contractions: Irregular. Vag. Bleeding: None.  Movement: Present. Denies leaking of fluid.   The following portions of the patient's history were reviewed and updated as appropriate: allergies, current medications, past family history, past medical history, past social history, past surgical history and problem list. Problem list updated.  Objective:   Vitals:   04/26/20 1405  BP: 118/68  Pulse: 91  Weight: 166 lb 12.8 oz (75.7 kg)    Fetal Status:     Movement: Present     General:  Alert, oriented and cooperative. Patient is in no acute distress.  Skin: Skin is warm and dry. No rash noted.   Cardiovascular: Normal heart rate noted  Respiratory: Normal respiratory effort, no problems with respiration noted  Abdomen: Soft, gravid, appropriate for gestational age. Pain/Pressure: Present     Pelvic:  Cervical exam performed       3cm / 50% / -2 / Vtx  Extremities: Normal range of motion.  Edema: None  Mental Status: Normal mood and affect. Normal behavior. Normal judgment and thought content.   Urinalysis:      Assessment and Plan:  Pregnancy: G2P0102 at [redacted]w[redacted]d  1. Supervision of other normal pregnancy, antepartum  2. Short interval between pregnancies complicating pregnancy, antepartum  3. H/O  pre-eclampsia in prior pregnancy, currently pregnant  4. Hx of postpartum hemorrhage, currently pregnant   Term labor symptoms and general obstetric precautions including but not limited to vaginal bleeding, contractions, leaking of fluid and fetal movement were reviewed in detail with the patient. Please refer to After Visit Summary for other counseling recommendations.   Return in about 1 week (around 05/03/2020) for ROB with midwife.  NST.Marland Kitchen   Brock Bad, MD

## 2020-04-27 ENCOUNTER — Other Ambulatory Visit: Payer: Self-pay

## 2020-04-27 ENCOUNTER — Encounter (HOSPITAL_COMMUNITY): Payer: Self-pay | Admitting: Obstetrics & Gynecology

## 2020-04-27 ENCOUNTER — Inpatient Hospital Stay (HOSPITAL_COMMUNITY)
Admission: AD | Admit: 2020-04-27 | Discharge: 2020-04-29 | DRG: 807 | Disposition: A | Discharge status: Home / Self Care | Payer: Medicaid Other | Attending: Obstetrics & Gynecology | Admitting: Obstetrics & Gynecology

## 2020-04-27 ENCOUNTER — Inpatient Hospital Stay (HOSPITAL_COMMUNITY): Payer: Medicaid Other | Admitting: Anesthesiology

## 2020-04-27 DIAGNOSIS — Z3A39 39 weeks gestation of pregnancy: Secondary | ICD-10-CM

## 2020-04-27 DIAGNOSIS — O4292 Full-term premature rupture of membranes, unspecified as to length of time between rupture and onset of labor: Secondary | ICD-10-CM | POA: Diagnosis present

## 2020-04-27 DIAGNOSIS — Z880 Allergy status to penicillin: Secondary | ICD-10-CM

## 2020-04-27 DIAGNOSIS — A568 Sexually transmitted chlamydial infection of other sites: Secondary | ICD-10-CM

## 2020-04-27 DIAGNOSIS — Z20822 Contact with and (suspected) exposure to covid-19: Secondary | ICD-10-CM | POA: Diagnosis present

## 2020-04-27 DIAGNOSIS — O09299 Supervision of pregnancy with other poor reproductive or obstetric history, unspecified trimester: Secondary | ICD-10-CM

## 2020-04-27 DIAGNOSIS — O4202 Full-term premature rupture of membranes, onset of labor within 24 hours of rupture: Secondary | ICD-10-CM

## 2020-04-27 DIAGNOSIS — O99824 Streptococcus B carrier state complicating childbirth: Secondary | ICD-10-CM | POA: Diagnosis present

## 2020-04-27 DIAGNOSIS — B951 Streptococcus, group B, as the cause of diseases classified elsewhere: Secondary | ICD-10-CM

## 2020-04-27 DIAGNOSIS — Z3480 Encounter for supervision of other normal pregnancy, unspecified trimester: Secondary | ICD-10-CM

## 2020-04-27 DIAGNOSIS — O09899 Supervision of other high risk pregnancies, unspecified trimester: Secondary | ICD-10-CM

## 2020-04-27 LAB — RPR: RPR Ser Ql: NONREACTIVE

## 2020-04-27 LAB — RESP PANEL BY RT-PCR (FLU A&B, COVID) ARPGX2
Influenza A by PCR: NEGATIVE
Influenza B by PCR: NEGATIVE
SARS Coronavirus 2 by RT PCR: NEGATIVE

## 2020-04-27 LAB — TYPE AND SCREEN
ABO/RH(D): A POS
Antibody Screen: NEGATIVE

## 2020-04-27 LAB — CBC
HCT: 36.6 % (ref 36.0–46.0)
Hemoglobin: 11.4 g/dL — ABNORMAL LOW (ref 12.0–15.0)
MCH: 26.3 pg (ref 26.0–34.0)
MCHC: 31.1 g/dL (ref 30.0–36.0)
MCV: 84.3 fL (ref 80.0–100.0)
Platelets: 368 10*3/uL (ref 150–400)
RBC: 4.34 MIL/uL (ref 3.87–5.11)
RDW: 19.8 % — ABNORMAL HIGH (ref 11.5–15.5)
WBC: 10.7 10*3/uL — ABNORMAL HIGH (ref 4.0–10.5)
nRBC: 0 % (ref 0.0–0.2)

## 2020-04-27 LAB — POCT FERN TEST: POCT Fern Test: POSITIVE

## 2020-04-27 MED ORDER — ONDANSETRON HCL 4 MG/2ML IJ SOLN: 4.0000 mg | Freq: Four times a day (QID) | INTRAMUSCULAR | Status: DC | PRN

## 2020-04-27 MED ORDER — FENTANYL CITRATE (PF) 100 MCG/2ML IJ SOLN
INTRAMUSCULAR | Status: AC
  Filled 2020-04-27: qty 2

## 2020-04-27 MED ORDER — LACTATED RINGERS IV SOLN: INTRAVENOUS | Status: DC

## 2020-04-27 MED ORDER — OXYCODONE-ACETAMINOPHEN 5-325 MG PO TABS: 1.0000 | ORAL_TABLET | ORAL | Status: DC | PRN

## 2020-04-27 MED ORDER — LACTATED RINGERS IV SOLN
500.0000 mL | Freq: Once | INTRAVENOUS | Status: AC
  Administered 2020-04-27: 500 mL via INTRAVENOUS

## 2020-04-27 MED ORDER — OXYTOCIN BOLUS FROM INFUSION
333.0000 mL | Freq: Once | INTRAVENOUS | Status: AC
  Administered 2020-04-27: 333 mL via INTRAVENOUS

## 2020-04-27 MED ORDER — OXYTOCIN-SODIUM CHLORIDE 30-0.9 UT/500ML-% IV SOLN
2.5000 [IU]/h | INTRAVENOUS | Status: DC
  Filled 2020-04-27: qty 500

## 2020-04-27 MED ORDER — BUPIVACAINE HCL (PF) 0.25 % IJ SOLN
INTRAMUSCULAR | Status: DC | PRN
  Administered 2020-04-27 (×2): 4 mL via EPIDURAL

## 2020-04-27 MED ORDER — WITCH HAZEL-GLYCERIN EX PADS: 1.0000 "application " | MEDICATED_PAD | CUTANEOUS | Status: DC | PRN

## 2020-04-27 MED ORDER — SODIUM CHLORIDE 0.9 % IV SOLN
5.0000 10*6.[IU] | Freq: Once | INTRAVENOUS | Status: AC
  Administered 2020-04-27: 5 10*6.[IU] via INTRAVENOUS
  Filled 2020-04-27: qty 5

## 2020-04-27 MED ORDER — FENTANYL-BUPIVACAINE-NACL 0.5-0.125-0.9 MG/250ML-% EP SOLN
12.0000 mL/h | EPIDURAL | Status: DC | PRN
  Filled 2020-04-27: qty 250

## 2020-04-27 MED ORDER — VANCOMYCIN HCL 10 G IV SOLR
2000.0000 mg | Freq: Once | INTRAVENOUS | Status: DC
  Filled 2020-04-27: qty 2000

## 2020-04-27 MED ORDER — ACETAMINOPHEN 325 MG PO TABS: 650.0000 mg | ORAL_TABLET | ORAL | Status: DC | PRN

## 2020-04-27 MED ORDER — OXYCODONE-ACETAMINOPHEN 5-325 MG PO TABS: 2.0000 | ORAL_TABLET | ORAL | Status: DC | PRN

## 2020-04-27 MED ORDER — LACTATED RINGERS IV SOLN: 500.0000 mL | INTRAVENOUS | Status: DC | PRN

## 2020-04-27 MED ORDER — DIPHENHYDRAMINE HCL 25 MG PO CAPS: 25.0000 mg | ORAL_CAPSULE | Freq: Four times a day (QID) | ORAL | Status: DC | PRN

## 2020-04-27 MED ORDER — VANCOMYCIN HCL IN DEXTROSE 1-5 GM/200ML-% IV SOLN: 1000.0000 mg | Freq: Two times a day (BID) | INTRAVENOUS | Status: DC

## 2020-04-27 MED ORDER — COCONUT OIL OIL
1.0000 "application " | TOPICAL_OIL | Status: DC | PRN
  Administered 2020-04-29: 1 via TOPICAL

## 2020-04-27 MED ORDER — PRENATAL MULTIVITAMIN CH
1.0000 | ORAL_TABLET | Freq: Every day | ORAL | Status: DC
  Administered 2020-04-27 – 2020-04-29 (×3): 1 via ORAL
  Filled 2020-04-27 (×3): qty 1

## 2020-04-27 MED ORDER — SENNOSIDES-DOCUSATE SODIUM 8.6-50 MG PO TABS
2.0000 | ORAL_TABLET | ORAL | Status: DC
  Administered 2020-04-27 – 2020-04-28 (×2): 2 via ORAL
  Filled 2020-04-27 (×2): qty 2

## 2020-04-27 MED ORDER — DIPHENHYDRAMINE HCL 50 MG/ML IJ SOLN: 12.5000 mg | INTRAMUSCULAR | Status: DC | PRN

## 2020-04-27 MED ORDER — LIDOCAINE HCL (PF) 1 % IJ SOLN
INTRAMUSCULAR | Status: DC | PRN
  Administered 2020-04-27 (×2): 5 mL via EPIDURAL

## 2020-04-27 MED ORDER — FENTANYL CITRATE (PF) 100 MCG/2ML IJ SOLN
INTRAMUSCULAR | Status: DC | PRN
  Administered 2020-04-27: 100 ug via EPIDURAL

## 2020-04-27 MED ORDER — PENICILLIN G POT IN DEXTROSE 60000 UNIT/ML IV SOLN
3.0000 10*6.[IU] | INTRAVENOUS | Status: DC
  Filled 2020-04-27 (×2): qty 50

## 2020-04-27 MED ORDER — ONDANSETRON HCL 4 MG/2ML IJ SOLN: 4.0000 mg | INTRAMUSCULAR | Status: DC | PRN

## 2020-04-27 MED ORDER — PHENYLEPHRINE 40 MCG/ML (10ML) SYRINGE FOR IV PUSH (FOR BLOOD PRESSURE SUPPORT): 80.0000 ug | PREFILLED_SYRINGE | INTRAVENOUS | Status: DC | PRN

## 2020-04-27 MED ORDER — SIMETHICONE 80 MG PO CHEW: 80.0000 mg | CHEWABLE_TABLET | ORAL | Status: DC | PRN

## 2020-04-27 MED ORDER — DIBUCAINE (PERIANAL) 1 % EX OINT: 1.0000 "application " | TOPICAL_OINTMENT | CUTANEOUS | Status: DC | PRN

## 2020-04-27 MED ORDER — ACETAMINOPHEN 325 MG PO TABS
650.0000 mg | ORAL_TABLET | ORAL | Status: DC | PRN
  Administered 2020-04-27 – 2020-04-28 (×3): 650 mg via ORAL
  Filled 2020-04-27 (×3): qty 2

## 2020-04-27 MED ORDER — TETANUS-DIPHTH-ACELL PERTUSSIS 5-2.5-18.5 LF-MCG/0.5 IM SUSY: 0.5000 mL | PREFILLED_SYRINGE | Freq: Once | INTRAMUSCULAR | Status: DC

## 2020-04-27 MED ORDER — BENZOCAINE-MENTHOL 20-0.5 % EX AERO: 1.0000 "application " | INHALATION_SPRAY | CUTANEOUS | Status: DC | PRN

## 2020-04-27 MED ORDER — IBUPROFEN 600 MG PO TABS
600.0000 mg | ORAL_TABLET | Freq: Four times a day (QID) | ORAL | Status: DC
  Administered 2020-04-27 – 2020-04-29 (×9): 600 mg via ORAL
  Filled 2020-04-27 (×9): qty 1

## 2020-04-27 MED ORDER — TRANEXAMIC ACID-NACL 1000-0.7 MG/100ML-% IV SOLN
1000.0000 mg | Freq: Once | INTRAVENOUS | Status: AC
  Administered 2020-04-27: 1000 mg via INTRAVENOUS
  Filled 2020-04-27: qty 100

## 2020-04-27 MED ORDER — SODIUM CHLORIDE (PF) 0.9 % IJ SOLN
INTRAMUSCULAR | Status: DC | PRN
  Administered 2020-04-27: 12 mL/h via EPIDURAL

## 2020-04-27 MED ORDER — ONDANSETRON HCL 4 MG PO TABS: 4.0000 mg | ORAL_TABLET | ORAL | Status: DC | PRN

## 2020-04-27 MED ORDER — SOD CITRATE-CITRIC ACID 500-334 MG/5ML PO SOLN: 30.0000 mL | ORAL | Status: DC | PRN

## 2020-04-27 MED ORDER — EPHEDRINE 5 MG/ML INJ: 10.0000 mg | INTRAVENOUS | Status: DC | PRN

## 2020-04-27 MED ORDER — LIDOCAINE HCL (PF) 1 % IJ SOLN: 30.0000 mL | INTRAMUSCULAR | Status: DC | PRN

## 2020-04-27 MED ORDER — MAGNESIUM HYDROXIDE 400 MG/5ML PO SUSP: 30.0000 mL | ORAL | Status: DC | PRN

## 2020-04-27 MED ORDER — MEASLES, MUMPS & RUBELLA VAC IJ SOLR: 0.5000 mL | Freq: Once | INTRAMUSCULAR | Status: DC

## 2020-04-27 NOTE — MAU Note (Signed)
Pt reports ROM at 0145, contractions, decreased fetal movement

## 2020-04-27 NOTE — Lactation Note (Addendum)
This note was copied from a baby's chart. Lactation Consultation Note  Patient Name: Jacqueline [name] Today's Date: 04/27/2020 Reason for consult: Initial assessment;Term  Visited with mom of 34 hours old FT female, she's P2. Mom reported she BF her twins for 2 months and that she didn't experience any BF difficulties with them; they're now 54 months old. This was a teen pregnancy, mom is 18 y.o. She participated in the [facility] program at the [facility] and she's already familiar with hand expression.  When [facility] offered assistance with hand expression, mom politely declined, she was in bed under the covers with FOB; but she voiced that she did see some colostrum when she hand expressed, praised her for her efforts. Mom told LC she didn't require any assistance with the latch at this point when [facility] offered. Asked mom to call for assistance when needed.  She chose to do breast/formula on admission, baby has already been supplemented with Similac 20 calorie formula; but mom said he wasn't very hungry. Reviewed normal newborn behavior, feeding cues, cluster feeding, size of baby's stomach and lactogenesis II.  Feeding plan:  1. Encouraged mom to feed baby on cues 8-12 times/24 hours or sooner if feeding cues are present 2. Hand expression and spoon feeding were also encouraged 3. STS care was also recommended, even if baby is still not latching on 4. Parents will continue supplementing with Similac 20 calorie formula per feeding choice on admission  BF brochure, BF resources and feeding diary were reviewed. Dad present at the time of [facility] consultation. Parents reported all questions and concerns were answered, they're both aware of LC OP services and will call PRN.   Maternal Data Formula Feeding for Exclusion: Yes Reason for exclusion: Mother's choice to formula and breast feed on admission Has patient been taught Hand Expression?: Yes Does the patient have breastfeeding experience prior to this  delivery?: Yes  Feeding    LATCH Score                   Interventions Interventions: Breast feeding basics reviewed  Lactation Tools Discussed/Used WIC Program: Yes   Consult Status Consult Status: Follow-up Date: 04/28/20 Follow-up type: In-patient    Jacqueline [name] 04/27/2020, 4:35 PM

## 2020-04-27 NOTE — Plan of Care (Signed)
  Problem: Education: Goal: Knowledge of General Education information will improve Description: Including pain rating scale, medication(s)/side effects and non-pharmacologic comfort measures Outcome: Completed/Met   Problem: Health Behavior/Discharge Planning: Goal: Ability to manage health-related needs will improve Outcome: Completed/Met   Problem: Clinical Measurements: Goal: Ability to maintain clinical measurements within normal limits will improve Outcome: Completed/Met Goal: Will remain free from infection Outcome: Completed/Met Goal: Diagnostic test results will improve Outcome: Completed/Met Goal: Respiratory complications will improve Outcome: Completed/Met Goal: Cardiovascular complication will be avoided Outcome: Completed/Met   Problem: Activity: Goal: Risk for activity intolerance will decrease Outcome: Completed/Met   Problem: Nutrition: Goal: Adequate nutrition will be maintained Outcome: Completed/Met   Problem: Coping: Goal: Level of anxiety will decrease Outcome: Completed/Met   Problem: Elimination: Goal: Will not experience complications related to bowel motility Outcome: Completed/Met Goal: Will not experience complications related to urinary retention Outcome: Completed/Met   Problem: Pain Managment: Goal: General experience of comfort will improve Outcome: Completed/Met   Problem: Safety: Goal: Ability to remain free from injury will improve Outcome: Completed/Met   Problem: Skin Integrity: Goal: Risk for impaired skin integrity will decrease Outcome: Completed/Met   Problem: Education: Goal: Knowledge of Childbirth will improve Outcome: Completed/Met Goal: Ability to make informed decisions regarding treatment and plan of care will improve Outcome: Completed/Met Goal: Ability to state and carry out methods to decrease the pain will improve Outcome: Completed/Met Goal: Individualized Educational Video(s) Outcome: Completed/Met    Problem: Coping: Goal: Ability to verbalize concerns and feelings about labor and delivery will improve Outcome: Completed/Met   Problem: Life Cycle: Goal: Ability to make normal progression through stages of labor will improve Outcome: Completed/Met Goal: Ability to effectively push during vaginal delivery will improve Outcome: Completed/Met   Problem: Role Relationship: Goal: Will demonstrate positive interactions with the child Outcome: Completed/Met   Problem: Safety: Goal: Risk of complications during labor and delivery will decrease Outcome: Completed/Met   Problem: Pain Management: Goal: Relief or control of pain from uterine contractions will improve Outcome: Completed/Met

## 2020-04-27 NOTE — Anesthesia Procedure Notes (Signed)
Epidural Patient location during procedure: OB Start time: 04/27/2020 3:27 AM End time: 04/27/2020 3:37 AM  Staffing Anesthesiologist: Leonides Grills, MD Performed: anesthesiologist   Preanesthetic Checklist Completed: patient identified, IV checked, site marked, risks and benefits discussed, monitors and equipment checked, pre-op evaluation and timeout performed  Epidural Patient position: sitting Prep: DuraPrep Patient monitoring: heart rate, cardiac monitor, continuous pulse ox and blood pressure Approach: midline Location: L4-L5 Injection technique: LOR air  Needle:  Needle type: Tuohy  Needle gauge: 17 G Needle length: 9 cm Needle insertion depth: 5 cm Catheter type: closed end flexible Catheter size: 19 Gauge Catheter at skin depth: 10 cm Test dose: negative and Other  Assessment Events: blood not aspirated, injection not painful, no injection resistance and negative IV test  Additional Notes Informed consent obtained prior to proceeding including risk of failure, 1% risk of PDPH, risk of minor discomfort and bruising. Discussed alternatives to epidural analgesia and patient desires to proceed.  Timeout performed pre-procedure verifying patient name, procedure, and platelet count.  Patient tolerated procedure well. Reason for block:procedure for pain

## 2020-04-27 NOTE — Discharge Summary (Addendum)
Postpartum Discharge Summary   Patient Name: [place] Satchell DOB: 2001/02/06 MRN: [id number]  Date of admission: 04/27/2020 Delivery date:04/27/2020  Delivering provider: Fatima Blank A  Date of discharge: 04/28/2020  Admitting diagnosis: Indication for care in labor or delivery [O75.9] Intrauterine pregnancy: [redacted]w[redacted]d    Secondary diagnosis:  Active Problems:   Hx of postpartum hemorrhage, currently pregnant   Indication for care in labor or delivery   SVD (spontaneous vaginal delivery)  Additional problems: None   Discharge diagnosis: Term Pregnancy Delivered                                              Post partum procedures:None Augmentation: N/A Complications: None  Hospital course: Onset of Labor With Vaginal Delivery      19y.o. yo [id number] admitted in Active Labor on 04/27/2020. Patient had an uncomplicated labor course as follows:  Membrane Rupture Time/Date: 1:45 AM ,04/27/2020   Delivery Method:Vaginal, Spontaneous  Episiotomy: None  Lacerations:  Labial  Patient had an uncomplicated postpartum course.  She is ambulating, tolerating a regular diet, passing flatus, and urinating well. Patient is discharged home in stable condition on 04/28/20.  Newborn Data: Birth date:04/27/2020  Birth time:4:59 AM  Gender:Female  Living status:Living  Apgars:9 ,9  Weight:3390 g   Magnesium Sulfate received: No BMZ received: yes Rhophylac:No MMR:No T-DaP:declined Flu: No Transfusion:No  Physical exam  Vitals:   04/27/20 1200 04/27/20 1556 04/27/20 2200 04/28/20 0500  BP: 117/70 117/71 108/75 108/69  Pulse: 70 74 81 75  Resp:    18  Temp: 98.1 F (36.7 C) 98.2 F (36.8 C) 98.1 F (36.7 C) 97.8 F (36.6 C)  TempSrc: Oral Oral Oral Oral  SpO2:   100% 100%  Weight:      Height:       General: alert and no distress Lochia: appropriate Uterine Fundus: firm Incision: N/A DVT Evaluation: No evidence of DVT seen on physical exam. Labs: Lab  Results  Component Value Date   WBC 10.7 (H) 04/27/2020   HGB 11.4 (L) 04/27/2020   HCT 36.6 04/27/2020   MCV 84.3 04/27/2020   PLT 368 04/27/2020   CMP Latest Ref Rng & Units 04/22/2020  Glucose 70 - 99 mg/dL 79  BUN 6 - 20 mg/dL <5(L)  Creatinine 0.44 - 1.00 mg/dL 0.50  Sodium 135 - 145 mmol/L 135  Potassium 3.5 - 5.1 mmol/L 3.4(L)  Chloride 98 - 111 mmol/L 105  CO2 22 - 32 mmol/L 20(L)  Calcium 8.9 - 10.3 mg/dL 8.6(L)  Total Protein 6.5 - 8.1 g/dL 5.8(L)  Total Bilirubin 0.3 - 1.2 mg/dL 0.7  Alkaline Phos 38 - 126 U/L 139(H)  AST 15 - 41 U/L 17  ALT 0 - 44 U/L 12   Edinburgh Score: Edinburgh Postnatal Depression Scale Screening Tool 04/27/2020  I have been able to laugh and see the funny side of things. (No Data)  I have looked forward with enjoyment to things. -  I have blamed myself unnecessarily when things went wrong. -  I have been anxious or worried for no good reason. -  I have felt scared or panicky for no good reason. -  Things have been getting on top of me. -  I have been so unhappy that I have had difficulty sleeping. -  I have felt sad or miserable. -  I have been so unhappy that I have been crying. -  The thought of harming myself has occurred to me. Flavia Shipper Postnatal Depression Scale Total -     After visit meds:  Allergies as of 04/28/2020      Reactions   Feraheme [ferumoxytol] Other (See Comments)   Nausea, back pain, dizziness   Tomato Other (See Comments)   Makes asthma worse      Medication List    STOP taking these medications   aspirin EC 81 MG tablet   Butalbital-APAP-Caffeine 50-325-40 MG capsule   cefadroxil 500 MG capsule Commonly known as: DURICEF   famotidine 20 MG tablet Commonly known as: PEPCID   multivitamin-prenatal 27-0.8 MG Tabs tablet     TAKE these medications   acetaminophen 325 MG tablet Commonly known as: Tylenol Take 2 tablets (650 mg total) by mouth every 4 (four) hours as needed (for pain scale <  4).   ibuprofen 600 MG tablet Commonly known as: ADVIL Take 1 tablet (600 mg total) by mouth every 6 (six) hours.        Discharge home in stable condition Infant Feeding: Bottle and Breast Infant Disposition:home with mother Discharge instruction: per After Visit Summary and Postpartum booklet. Activity: Advance as tolerated. Pelvic rest for 6 weeks.  Diet: routine diet Future Appointments: Future Appointments  Date Time Provider [place]  05/04/2020 10:30 AM Griffin Basil, MD [place] None  05/08/2020  6:30 AM [facility] [employer] [facility] None   Follow up Visit: Message sent to [facility] on 04/27/20: Please schedule this patient for a Virtual postpartum visit in 4 weeks with the following provider: midwife, Lattie Haw L-K if possible. Additional Postpartum F/U:n/a  Low risk pregnancy complicated by: n/a Delivery mode:  Vaginal, Spontaneous  Anticipated Birth Control:  Depo given in hospital   04/28/2020 Layla Barter, MD  GME ATTESTATION:  I saw and evaluated the patient. I agree with the findings and the plan of care as documented in the resident's note.  Arrie Senate, MD OB Fellow, [facility] [place] 04/28/2020 8:13 AM

## 2020-04-27 NOTE — MAU Provider Note (Signed)
Jacqueline [name] is a 19 y.o. female [phone] with IUP at [redacted]w[redacted]d by  [place] presenting for SROM and contractions.  She reports positive fetal movement. She endorses leakage of fluid 0145 but denies vaginal bleeding.  Prenatal History/Complications: PNC at [facility] Pregnancy complications:  - Past Medical History: Past Medical History:  Diagnosis Date  . Asthma   . Migraines   . Pregnancy induced hypertension   History of PP Hemorrhage required 2 units of blood  Past Surgical History: Past Surgical History:  Procedure Laterality Date  . WISDOM TOOTH EXTRACTION      Obstetrical History: OB History    Gravida  2   Para  1   Term  0   Preterm  1   AB  0   Living  2     SAB  0   TAB  0   Ectopic  0   Multiple  1   Live Births  2            Social History: Social History   Socioeconomic History  . Marital status: Single    Spouse name: Not on file  . Number of children: Not on file  . Years of education: Not on file  . Highest education level: Not on file  Occupational History  . Not on file  Tobacco Use  . Smoking status: Never Smoker  . Smokeless tobacco: Never Used  Vaping Use  . Vaping Use: Former  Substance and Sexual Activity  . Alcohol use: Never    Alcohol/week: 0.0 standard drinks  . Drug use: Not Currently    Types: Marijuana    Comment: NOT SMOKED DURING PREG  . Sexual activity: Yes    Comment: pregnant   Other Topics Concern  . Not on file  Social History Narrative   Lives with:  Godmother, going well   School:  is in 9th grade and is doing okay, at [employer]:  Mother, aunt and godmother   Future Plans:  college   Exercise:  Cheerleading      Confidentiality was discussed with the patient and if applicable, with caregiver as well.      Patient's personal or confidential phone number: [phone]   Tobacco?  yes, has tried blacks, thinking she is going to stop   Drugs/ETOH?  no   Partner preference?  female  Sexually Active?  yes, 1 partner    Pregnancy Prevention:  none, reviewed condoms & plan B   Safe at home, in school & in relationships?  yes   Safe to self?   yes, previous SI, wellcontrolled with care plan now   Guns in the home?  no       Social Determinants of Health   Financial Resource Strain:   . Difficulty of Paying Living Expenses: Not on file  Food Insecurity:   . Worried About Programme researcher, broadcasting/film/video in the Last Year: Not on file  . Ran Out of Food in the Last Year: Not on file  Transportation Needs:   . Lack of Transportation (Medical): Not on file  . Lack of Transportation (Non-Medical): Not on file  Physical Activity:   . Days of Exercise per Week: Not on file  . Minutes of Exercise per Session: Not on file  Stress:   . Feeling of Stress : Not on file  Social Connections:   . Frequency of Communication with Friends and Family: Not on file  . Frequency of Social Gatherings  with Friends and Family: Not on file  . Attends Religious Services: Not on file  . Active Member of Clubs or Organizations: Not on file  . Attends Banker Meetings: Not on file  . Marital Status: Not on file    Family History: Family History  Problem Relation Age of Onset  . Hypertension Mother   . Asthma Brother   . Hypertension Brother   . Diabetes Brother   . Hypertension Maternal Aunt   . Diabetes Maternal Aunt   . Autism spectrum disorder Maternal Aunt   . Hypertension Maternal Grandmother   . Aneurysm Maternal Uncle     Allergies: Allergies  Allergen Reactions  . Feraheme [Ferumoxytol] Other (See Comments)    Nausea, back pain, dizziness  . Tomato Other (See Comments)    Makes asthma worse    Medications Prior to Admission  Medication Sig Dispense Refill Last Dose  . aspirin EC 81 MG tablet Take 1 tablet (81 mg total) by mouth daily. Take after 12 weeks for prevention of preeclampsia later in pregnancy. Start taking on May 18th 300 tablet 0 04/26/2020 at Unknown  time  . Butalbital-APAP-Caffeine 50-325-40 MG capsule Take 1-2 capsules by mouth every 6 (six) hours as needed for headache. 30 capsule 0 04/26/2020 at Unknown time  . Prenatal Vit-Fe Fumarate-FA (MULTIVITAMIN-PRENATAL) 27-0.8 MG TABS tablet Take 1 tablet by mouth daily at 12 noon.   04/26/2020 at Unknown time  . cefadroxil (DURICEF) 500 MG capsule Take 1 capsule (500 mg total) by mouth 2 (two) times daily for 7 days. 14 capsule 0   . famotidine (PEPCID) 20 MG tablet Take 1 tablet (20 mg total) by mouth 2 (two) times daily. 60 tablet 0     Review of Systems   Constitutional: Negative for fever and chills Eyes: Negative for visual disturbances Respiratory: Negative for shortness of breath, dyspnea Cardiovascular: Negative for chest pain or palpitations  Gastrointestinal: Negative for vomiting, diarrhea and constipation.  POSITIVE for abdominal pain (contractions) Genitourinary: Negative for dysuria and urgency Musculoskeletal: Negative for back pain, joint pain, myalgias  Neurological: Negative for dizziness and headaches  Blood pressure 115/70, pulse 85, temperature 98.3 F (36.8 C), temperature source Oral, resp. rate 17, height 5\' 4"  (1.626 m), weight 75.3 kg, last menstrual period 07/07/2019, SpO2 99 %, currently breastfeeding. General appearance: alert, cooperative and no distress Lungs: normal respiratory effort Heart: regular rate and rhythm Abdomen: soft, non-tender; bowel sounds normal Extremities: Homans sign is negative, no sign of DVT DTR's 2+ Presentation: cephalic Fetal monitoring  Baseline: 135 bpm, mod acel, no decels, present acel Uterine activity  q 1-3 Dilation: 4 Effacement (%): 70 Station: -2 Exam by:: [email or web address] CNM   Prenatal labs: ABO, Rh: --/--/A POS (08/03 1937) Antibody: NEG (08/03 1937) Rubella: 3.20 (05/05 1441) RPR: Non Reactive (09/07 1111)  HBsAg: Negative (05/05 1441)  HIV: Non Reactive (09/07 1111)  GBS:   pos in urine 1 hr Glucola 74 64  70 Genetic screening  normal Anatomy 02-11-1978 normal  Prenatal Transfer Tool  Maternal Diabetes: No Genetic Screening: Normal Maternal Ultrasounds/Referrals: Other:follow ups for growth, all normal Fetal Ultrasounds or other Referrals:  Other: follow up for growth Maternal Substance Abuse:  Yes:  Type: Smoker Significant Maternal Medications:  None Significant Maternal Lab Results: Group B Strep positive and PCN allergyr  Results for orders placed or performed during the hospital encounter of 04/27/20 (from the past 24 hour(s))  POCT fern test   Collection Time: 04/27/20  2:30  AM  Result Value Ref Range   POCT Fern Test Positive = ruptured amniotic membanes     Assessment: Jacqueline [place] is a 19 y.o. [phone] with an IUP at [redacted]w[redacted]d presenting for SROM at 0145 and now in active labor Will hang TXA at delivery Plan: #Labor: expectant management #Pain:  Per request #FWB Cat 1 #ID: GBS: pos in urine; will treat with PCN #MOF:  Breast and bottle #MOC: Depo #Circ: yes   Marylene Land 04/27/2020, 2:51 AM  Charlesetta Garibaldi Palma Buster 04/27/2020, 2:51 AM

## 2020-04-27 NOTE — Anesthesia Postprocedure Evaluation (Signed)
Anesthesia Post Note  Patient: [place] Mccl[place]  Procedure(s) Performed: AN AD HOC LABOR EPIDURAL     Patient location during evaluation: Mother Baby Anesthesia Type: Epidural Level of consciousness: awake and alert and oriented Pain management: satisfactory to patient Vital Signs Assessment: post-procedure vital signs reviewed and stable Respiratory status: respiratory function stable Cardiovascular status: stable Postop Assessment: no headache, no backache, epidural receding, patient able to bend at knees, no signs of nausea or vomiting and adequate PO intake Anesthetic complications: no   No complications documented.  Last Vitals:  Vitals:   04/27/20 0645 04/27/20 0846  BP: 117/74 120/78  Pulse: 68 62  Resp: 18 18  Temp: 36.5 C 36.8 C  SpO2: 100%     Last Pain:  Vitals:   04/27/20 0847  TempSrc:   PainSc: 0-No pain   Pain Goal:                   Ardian Haberland

## 2020-04-27 NOTE — Anesthesia Preprocedure Evaluation (Signed)
Anesthesia Evaluation  Patient identified by MRN, date of birth, ID band Patient awake    Reviewed: Allergy & Precautions, H&P , NPO status , Patient's Chart, lab work & pertinent test results  History of Anesthesia Complications Negative for: history of anesthetic complications  Airway Mallampati: II  TM Distance: >3 FB Neck ROM: full    Dental no notable dental hx. (+) Teeth Intact   Pulmonary asthma ,    Pulmonary exam normal breath sounds clear to auscultation       Cardiovascular hypertension, negative cardio ROS Normal cardiovascular exam Rhythm:regular Rate:Normal     Neuro/Psych  Headaches, negative psych ROS   GI/Hepatic negative GI ROS, Neg liver ROS,   Endo/Other  negative endocrine ROS  Renal/GU negative Renal ROS  negative genitourinary   Musculoskeletal   Abdominal   Peds  Hematology negative hematology ROS (+)   Anesthesia Other Findings   Reproductive/Obstetrics (+) Pregnancy                             Anesthesia Physical Anesthesia Plan  ASA: II  Anesthesia Plan: Epidural   Post-op Pain Management:    Induction:   PONV Risk Score and Plan:   Airway Management Planned:   Additional Equipment:   Intra-op Plan:   Post-operative Plan:   Informed Consent: I have reviewed the patients History and Physical, chart, labs and discussed the procedure including the risks, benefits and alternatives for the proposed anesthesia with the patient or authorized representative who has indicated his/her understanding and acceptance.       Plan Discussed with:   Anesthesia Plan Comments:         Anesthesia Quick Evaluation

## 2020-04-28 ENCOUNTER — Other Ambulatory Visit (HOSPITAL_COMMUNITY): Payer: Self-pay | Admitting: Obstetrics and Gynecology

## 2020-04-28 MED ORDER — MEDROXYPROGESTERONE ACETATE 150 MG/ML IM SUSP
150.0000 mg | Freq: Once | INTRAMUSCULAR | Status: DC
  Filled 2020-04-28: qty 1

## 2020-04-28 MED ORDER — ACETAMINOPHEN 325 MG PO TABS
650.0000 mg | ORAL_TABLET | ORAL | Status: DC | PRN
Start: 2020-04-28 — End: 2020-09-07

## 2020-04-28 MED ORDER — IBUPROFEN 600 MG PO TABS
600.0000 mg | ORAL_TABLET | Freq: Four times a day (QID) | ORAL | 0 refills | Status: DC
Start: 2020-04-28 — End: 2020-04-28

## 2020-04-28 MED FILL — IBUPROFEN 600 MG TABLET: 600 | 5 days supply | Qty: 30 | Fill #0

## 2020-04-28 NOTE — Lactation Note (Signed)
This note was copied from a baby's chart. Lactation Consultation Note  Patient Name: Jacqueline [name] Date: 04/28/2020 Reason for consult: Follow-up assessment  Follow up visit to 36 hours old with 5.01% weight loss. Baby is sleeping in mother's arms upon arrival. Mother states breastfeeding is going well and does not reports any problems at this point.   Reviewed newborn behavior and expectations with mother and encouraged to contact [facility] for support, questions or concerns.    All questions answered at this time.    Maternal Data Formula Feeding for Exclusion: Yes Reason for exclusion: Mother's choice to formula and breast feed on admission  Feeding Feeding Type: Breast Fed  Interventions Interventions: Breast feeding basics reviewed;Expressed milk  Consult Status Consult Status: Follow-up Date: 04/29/20 Follow-up type: In-patient    Jacqueline [name] 04/28/2020, 5:49 PM

## 2020-04-29 NOTE — Lactation Note (Signed)
This note was copied from a baby's chart. Lactation Consultation Note  Patient Name: Jacqueline [name] Date: 04/29/2020 Reason for consult: Follow-up assessment   Mother reports that infant just had a good 20 min feeding. She reports that this feeding was more consistent.  Mother reports that she has sore nipples . She reports that the pain scale is a #10 through out the entire feeding.  Mother face timing on the phone with her twins at home. I told her I would come back to continue with consult and bring her some comfort gels later.    Maternal Data    Feeding Feeding Type: Breast Fed  LATCH Score                   Interventions Interventions: Coconut oil;Comfort gels;Hand pump  Lactation Tools Discussed/Used     Consult Status Consult Status: Follow-up Date: 04/30/20 Follow-up type: In-patient    Stevan Born [facility] 04/29/2020, 9:17 AM

## 2020-04-29 NOTE — Lactation Note (Addendum)
This note was copied from a baby's chart. Lactation Consultation Note  Patient Name: Boy [place] Hayner [id number] Date: 04/29/2020 Reason for consult: Follow-up assessment   Returned to mothers room. Observed that mother has bilateral cracks behind the nipple shafts. Mother reports that infant is unable to get the more than the nipple in his mouth.   Suggested that mother allow LC to assist her with latching infant. Mother was given a hand pump with a #21 flange. Mother reports that she plans to get a DEBP from [facility] on Monday.   She doesn't like to use the hand pump. Mother was given comfort gels. Advised to use ebm on nipples and allow to air dry.  Discussed treatment and prevention of engorgement.   Mother to page to assist with next feeding.   Plan of Care : Breastfeed infant with feeding cues Supplement infant with ebm  Pump using a DEBP after each feeding for 15-20 mins.   Mother to continue to cue base feed infant and feed at least 8-12 times or more in 24 hours and advised to allow for cluster feeding infant as needed.  Mother to continue to due STS. Mother is aware of available LC services at [facility], BFSG'S, OP Dept, and phone # for questions or concerns about breastfeeding.  Mother receptive to all teaching and plan of care.     Maternal Data    Feeding Feeding Type: Breast Fed  LATCH Score          Comfort (Breast/Nipple): Engorged, cracked, bleeding, large blisters, severe discomfort (bilateral cracks behind the nipple shaft)        Interventions Interventions: Coconut oil;Comfort gels;Hand pump  Lactation Tools Discussed/Used     Consult Status Consult Status: Complete Date: 04/30/20 Follow-up type: In-patient    Stevan Born [facility] 04/29/2020, 10:27 AM

## 2020-04-29 NOTE — Discharge Summary (Signed)
Postpartum Discharge Summary   Patient Name: Jacqueline [name] DOB: 04-11-2001 MRN: [id number]  Date of admission: 04/27/2020 Delivery date:04/27/2020  Delivering provider: Fatima Blank A  Date of discharge: 04/29/2020  Admitting diagnosis: Indication for care in labor or delivery [O75.9] Intrauterine pregnancy: [redacted]w[redacted]d    Secondary diagnosis:  Active Problems:   Hx of postpartum hemorrhage, currently pregnant   Indication for care in labor or delivery   SVD (spontaneous vaginal delivery)  Additional problems: None   Discharge diagnosis: Term Pregnancy Delivered                                              Post partum procedures:None Augmentation: N/A Complications: None  Hospital course: Onset of Labor With Vaginal Delivery      19y.o. yo [id number] admitted in Active Labor on 04/27/2020. Patient had an uncomplicated labor course as follows:  Membrane Rupture Time/Date: 1:45 AM ,04/27/2020   Delivery Method:Vaginal, Spontaneous  Episiotomy: None  Lacerations:  Labial  Patient had an uncomplicated postpartum course.  She is ambulating, tolerating a regular diet, passing flatus, and urinating well. Patient is discharged home in stable condition on 04/29/20.  Newborn Data: Birth date:04/27/2020  Birth time:4:59 AM  Gender:Female  Living status:Living  Apgars:9 ,9  Weight:3390 g   Magnesium Sulfate received: No BMZ received: yes Rhophylac:No MMR:No T-DaP:declined Flu: No Transfusion:No  Physical exam  Vitals:   04/28/20 0500 04/28/20 1213 04/28/20 2022 04/29/20 0542  BP: 108/69 108/65 123/83 113/73  Pulse: 75 63 60 (!) 53  Resp: '18 18 16 16  ' Temp: 97.8 F (36.6 C) 97.9 F (36.6 C) 98.1 F (36.7 C) 97.6 F (36.4 C)  TempSrc: Oral Oral Oral Oral  SpO2: 100% 100% 100% 100%  Weight:      Height:       General: alert and no distress Lochia: appropriate Uterine Fundus: firm Incision: N/A DVT Evaluation: No evidence of DVT seen on physical  exam. Labs: Lab Results  Component Value Date   WBC 10.7 (H) 04/27/2020   HGB 11.4 (L) 04/27/2020   HCT 36.6 04/27/2020   MCV 84.3 04/27/2020   PLT 368 04/27/2020   CMP Latest Ref Rng & Units 04/22/2020  Glucose 70 - 99 mg/dL 79  BUN 6 - 20 mg/dL <5(L)  Creatinine 0.44 - 1.00 mg/dL 0.50  Sodium 135 - 145 mmol/L 135  Potassium 3.5 - 5.1 mmol/L 3.4(L)  Chloride 98 - 111 mmol/L 105  CO2 22 - 32 mmol/L 20(L)  Calcium 8.9 - 10.3 mg/dL 8.6(L)  Total Protein 6.5 - 8.1 g/dL 5.8(L)  Total Bilirubin 0.3 - 1.2 mg/dL 0.7  Alkaline Phos 38 - 126 U/L 139(H)  AST 15 - 41 U/L 17  ALT 0 - 44 U/L 12   Edinburgh Score: Edinburgh Postnatal Depression Scale Screening Tool 04/27/2020  I have been able to laugh and see the funny side of things. (No Data)  I have looked forward with enjoyment to things. -  I have blamed myself unnecessarily when things went wrong. -  I have been anxious or worried for no good reason. -  I have felt scared or panicky for no good reason. -  Things have been getting on top of me. -  I have been so unhappy that I have had difficulty sleeping. -  I have felt sad or  miserable. -  I have been so unhappy that I have been crying. -  The thought of harming myself has occurred to me. Flavia Shipper Postnatal Depression Scale Total -     After visit meds:  Allergies as of 04/29/2020      Reactions   Feraheme [ferumoxytol] Other (See Comments)   Nausea, back pain, dizziness   Tomato Other (See Comments)   Makes asthma worse      Medication List    STOP taking these medications   aspirin EC 81 MG tablet   Butalbital-APAP-Caffeine 50-325-40 MG capsule   cefadroxil 500 MG capsule Commonly known as: DURICEF   famotidine 20 MG tablet Commonly known as: PEPCID   multivitamin-prenatal 27-0.8 MG Tabs tablet     TAKE these medications   acetaminophen 325 MG tablet Commonly known as: Tylenol Take 2 tablets (650 mg total) by mouth every 4 (four) hours as needed  (for pain scale < 4).   ibuprofen 600 MG tablet Commonly known as: ADVIL Take 1 tablet (600 mg total) by mouth every 6 (six) hours.        Discharge home in stable condition Infant Feeding: Bottle and Breast Infant Disposition:home with mother Discharge instruction: per After Visit Summary and Postpartum booklet. Activity: Advance as tolerated. Pelvic rest for 6 weeks.  Diet: routine diet Future Appointments: Future Appointments  Date Time Provider [place]  05/04/2020 10:30 AM Griffin Basil, MD [place] None   Follow up Visit: Message sent to [place] on 04/27/20: Please schedule this patient for a Virtual postpartum visit in 4 weeks with the following provider: midwife, Lattie Haw L-K if possible. Additional Postpartum F/U:n/a  Low risk pregnancy complicated by: n/a Delivery mode:  Vaginal, Spontaneous  Anticipated Birth Control:  Depo given in hospital   04/29/2020 Janet Berlin, MD

## 2020-05-01 NOTE — H&P (Signed)
Obstetric History and Physical  [place] Jacqueline [name] is a 19 y.o. [phone] with IUP at [redacted]w[redacted]d presenting for suspected rupture of membranes. Patient states she has been having  irregular, every 3-5 minutes contractions, none vaginal bleeding, ruptured membranes, with active fetal movement.    Prenatal Course Source of Care: [facility]   Pregnancy complications or risks: Patient Active Problem List   Diagnosis Date Noted  . Indication for care in labor or delivery 04/27/2020  . SVD (spontaneous vaginal delivery) 04/27/2020  . Pyelonephritis affecting pregnancy in second trimester 01/04/2020  . Nausea and vomiting during pregnancy prior to [redacted] weeks gestation 11/10/2019  . Hx of preeclampsia, prior pregnancy, currently pregnant 10/06/2019  . Hx of postpartum hemorrhage, currently pregnant 10/06/2019  . Short interval between pregnancies complicating pregnancy, antepartum 10/06/2019  . Chlamydia trachomatis infection during pregnancy in first trimester 10/06/2019  . Current nicotine use 10/06/2019  . Supervision of other normal pregnancy, antepartum 09/29/2019   She plans to breastfeed She desires Depo-Provera for postpartum contraception.   Prenatal labs and studies: ABO, Rh: --/--/A POS (11/25 0237) Antibody: NEG (11/25 0237) Rubella: 3.20 (05/05 1441) RPR: NON REACTIVE (11/25 0256)  HBsAg: Negative (05/05 1441)  HIV: Non Reactive (09/07 1111)  GBS:  1 hr Glucola  74 Genetic screening normal Anatomy US normal  Prenatal Transfer Tool  Maternal Diabetes: No Genetic Screening: Normal Maternal Ultrasounds/Referrals: Normal   Past Medical History:  Diagnosis Date  . Asthma   . Migraines   . Pregnancy induced hypertension     Past Surgical History:  Procedure Laterality Date  . WISDOM TOOTH EXTRACTION      OB History  Gravida Para Term Preterm AB Living  2 2 1 1  0 3  SAB TAB Ectopic Multiple Live Births  0 0 0 1 3    # Outcome Date GA Lbr Len/2nd Weight Sex Delivery Anes PTL  Lv  2 Term 04/27/20 [redacted]w[redacted]d 02:59 / 00:15 3390 g M Vag-Spont EPI  LIV     Birth Comments: WDL  1A Preterm 10/27/18 [redacted]w[redacted]d 06:39 / 00:26 2185 g F Vag-Spont EPI  LIV  1B Preterm 10/27/18 [redacted]w[redacted]d 06:39 / 00:32 2565 g F Vag-Spont EPI  LIV    Social History   Socioeconomic History  . Marital status: Single    Spouse name: Not on file  . Number of children: 2  . Years of education: Not on file  . Highest education level: Not on file  Occupational History  . Not on file  Tobacco Use  . Smoking status: Never Smoker  . Smokeless tobacco: Never Used  Vaping Use  . Vaping Use: Former  Substance and Sexual Activity  . Alcohol use: Never    Alcohol/week: 0.0 standard drinks  . Drug use: Not Currently    Types: Marijuana    Comment: NOT SMOKED DURING PREG  . Sexual activity: Yes    Comment: pregnant   Other Topics Concern  . Not on file  Social History Narrative   Lives with:  Godmother, going well   School:  is in 9th grade and is doing okay, at [redacted]w[redacted]d Systems:  Mother, aunt and godmother   Future Plans:  college   Exercise:  Cheerleading      Confidentiality was discussed with the patient and if applicable, with caregiver as well.      Patient's personal or confidential phone number: [phone]   Tobacco?  yes, has tried blacks, thinking she is going to stop  Drugs/ETOH?  no   Partner preference?  female Sexually Active?  yes, 1 partner    Pregnancy Prevention:  none, reviewed condoms & plan B   Safe at home, in school & in relationships?  yes   Safe to self?   yes, previous SI, wellcontrolled with care plan now   Guns in the home?  no       Social Determinants of Health   Financial Resource Strain:   . Difficulty of Paying Living Expenses: Not on file  Food Insecurity:   . Worried About Programme researcher, broadcasting/film/video in the Last Year: Not on file  . Ran Out of Food in the Last Year: Not on file  Transportation Needs:   . Lack of Transportation (Medical): Not on file  .  Lack of Transportation (Non-Medical): Not on file  Physical Activity:   . Days of Exercise per Week: Not on file  . Minutes of Exercise per Session: Not on file  Stress:   . Feeling of Stress : Not on file  Social Connections:   . Frequency of Communication with Friends and Family: Not on file  . Frequency of Social Gatherings with Friends and Family: Not on file  . Attends Religious Services: Not on file  . Active Member of Clubs or Organizations: Not on file  . Attends Banker Meetings: Not on file  . Marital Status: Not on file    Family History  Problem Relation Age of Onset  . Hypertension Mother   . Asthma Brother   . Hypertension Brother   . Diabetes Brother   . Hypertension Maternal Aunt   . Diabetes Maternal Aunt   . Autism spectrum disorder Maternal Aunt   . Hypertension Maternal Grandmother   . Aneurysm Maternal Uncle     No medications prior to admission.    Allergies  Allergen Reactions  . Feraheme [Ferumoxytol] Other (See Comments)    Nausea, back pain, dizziness  . Tomato Other (See Comments)    Makes asthma worse    Review of Systems: Negative except for what is mentioned in HPI.  Physical Exam: BP 113/73 (BP Location: Right Arm)   Pulse (!) 53   Temp 97.6 F (36.4 C) (Oral)   Resp 16   Ht 5\' 4"  (1.626 m)   Wt 75.3 kg   LMP 07/07/2019   SpO2 100%   Breastfeeding Unknown   BMI 28.49 kg/m  CONSTITUTIONAL: Well-developed, well-nourished female in no acute distress.  HENT:  Normocephalic, atraumatic, External right and left ear normal. Oropharynx is clear and moist EYES: Conjunctivae and EOM are normal. Pupils are equal, round, and reactive to light. No scleral icterus.  NECK: Normal range of motion, supple, no masses SKIN: Skin is warm and dry. No rash noted. Not diaphoretic. No erythema. No pallor. NEUROLOGIC: Alert and oriented to person, place, and time. Normal reflexes, muscle tone coordination. No cranial nerve deficit  noted. PSYCHIATRIC: Normal mood and affect. Normal behavior. Normal judgment and thought content. CARDIOVASCULAR: Normal heart rate noted, regular rhythm RESPIRATORY: Effort and breath sounds normal, no problems with respiration noted ABDOMEN: Soft, nontender, nondistended, gravid. MUSCULOSKELETAL: Normal range of motion. No edema and no tenderness. 2+ distal pulses.  Presentation: cephalic Fetal monitoring  Baseline: 135 bpm, mod acel, no decels, present acel Uterine activity  q 1-3 Dilation: 4 Effacement (%): 70 Station: -2  Pertinent Labs/Studies:   No results found for this or any previous visit (from the past 24 hour(s)).  Assessment :  Jacqueline [name] is a 19 y.o. [id number] at [redacted]w[redacted]d being admitted for labor and PROM.  Plan: Labor: Expectant management, Augmentation as ordered as per protocol. Analgesia as needed. FWB: Reassuring fetal heart tracing.  GBS positive PCN intrapartum Delivery plan: Hopeful for vaginal delivery   Raynelle Dick, MD, FACOG Obstetrician & Gynecologist, [facility] for [employer], [facility]

## 2020-05-03 ENCOUNTER — Encounter: Payer: Medicaid Other | Admitting: Obstetrics

## 2020-05-04 ENCOUNTER — Encounter: Payer: Medicaid Other | Admitting: Obstetrics and Gynecology

## 2020-05-06 ENCOUNTER — Other Ambulatory Visit (HOSPITAL_COMMUNITY): Payer: Medicaid Other

## 2020-05-08 ENCOUNTER — Inpatient Hospital Stay (HOSPITAL_COMMUNITY): Payer: Medicaid Other

## 2020-05-08 ENCOUNTER — Inpatient Hospital Stay (HOSPITAL_COMMUNITY)
Admission: AD | Admit: 2020-05-08 | Payer: Medicaid Other | Source: Home / Self Care | Admitting: Obstetrics & Gynecology

## 2020-05-30 ENCOUNTER — Ambulatory Visit: Payer: Medicaid Other | Admitting: Advanced Practice Midwife

## 2020-06-14 ENCOUNTER — Ambulatory Visit: Payer: Medicaid Other | Admitting: Obstetrics

## 2020-06-29 ENCOUNTER — Ambulatory Visit: Payer: Medicaid Other | Admitting: Obstetrics

## 2020-09-07 ENCOUNTER — Ambulatory Visit (INDEPENDENT_AMBULATORY_CARE_PROVIDER_SITE_OTHER): Payer: Medicaid Other | Admitting: Obstetrics and Gynecology

## 2020-09-07 ENCOUNTER — Ambulatory Visit: Payer: Medicaid Other | Admitting: Obstetrics and Gynecology

## 2020-09-07 ENCOUNTER — Encounter: Payer: Self-pay | Admitting: Obstetrics and Gynecology

## 2020-09-07 VITALS — BP 114/73 | HR 67 | Ht 64.0 in | Wt 145.0 lb

## 2020-09-07 DIAGNOSIS — Z30013 Encounter for initial prescription of injectable contraceptive: Secondary | ICD-10-CM | POA: Diagnosis not present

## 2020-09-07 DIAGNOSIS — Z3009 Encounter for other general counseling and advice on contraception: Secondary | ICD-10-CM | POA: Diagnosis not present

## 2020-09-07 LAB — POCT URINE PREGNANCY: Preg Test, Ur: NEGATIVE

## 2020-09-07 MED ORDER — MEDROXYPROGESTERONE ACETATE 150 MG/ML IM SUSP
150.0000 mg | Freq: Once | INTRAMUSCULAR | Status: AC
  Administered 2020-09-07: 150 mg via INTRAMUSCULAR

## 2020-09-07 NOTE — Progress Notes (Addendum)
Pt would like to discuss birth control. She is interested in Depo. She last had unprotected intercourse this past Saturday. States has not been using protection with intercourse. LMP: 08/10/20.  Depo Provera 150mg  given IM left deltoid. Pt to return between November 23, 2020- December 07, 2020 for repeat injection. Return appointment made at checkout.

## 2020-09-07 NOTE — Progress Notes (Signed)
    Contraception/Family Planning VISIT ENCOUNTER NOTE  Subjective:   Jacqueline [name] is a 20 y.o. [phone] female here for reproductive life counseling.  Desires nexplanon vs. depo injection. Reports she does not want a pregnancy in the next year. Denies abnormal vaginal bleeding, discharge, pelvic pain, problems with intercourse or other gynecologic concerns.    Gynecologic History Patient's last menstrual period was 08/10/2020 (approximate). Contraception: none  Health Maintenance Due  Topic Date Due  . COVID-19 Vaccine (1) Never done  . HPV VACCINES (1 - 2-dose series) Never done     The following portions of the patient's history were reviewed and updated as appropriate: allergies, current medications, past family history, past medical history, past social history, past surgical history and problem list.  Review of Systems Pertinent items are noted in HPI.   Objective:  BP 114/73 (BP Location: Right Arm, Patient Position: Sitting, Cuff Size: Normal)   Pulse 67   Ht 5\' 4"  (1.626 m)   Wt 145 lb (65.8 kg)   LMP 08/10/2020 (Approximate)   Breastfeeding No   BMI 24.89 kg/m  Gen: well appearing, NAD Ext: warm well perfused    Assessment and Plan:   Contraception counseling: Reviewed all forms of birth control options available including abstinence; over the counter/barrier methods; hormonal contraceptive medication including pill, patch, ring, injection,contraceptive implant; hormonal and nonhormonal IUDs; permanent sterilization options including vasectomy and the various tubal sterilization modalities. Risks and benefits reviewed.  Questions were answered.  Written information was also given to the patient to review.  Patient desires depo, this was given to the patient in the office today. She will follow up in 3 months for surveillance.  She was told to call with any further questions, or with any concerns about this method of contraception.  Emphasized use of condoms 100% of  the time for STI prevention.  1. Birth control counseling  - pregnancy test negative today - Depo provera given today.   Please refer to After Visit Summary for other counseling recommendations.   Return in about 3 months (around 12/07/2020).    02/07/2021, NP, Faculty Practice[facility] for [facility]

## 2020-09-07 NOTE — Patient Instructions (Signed)
Contraceptive Injection A contraceptive injection is a shot that prevents pregnancy. It is also called a birth control shot. The shot contains the hormone progestin, which prevents pregnancy by:  Stopping the ovaries from releasing eggs.  Thickening cervical mucus to prevent sperm from entering the cervix.  Thinning the lining of the uterus to prevent a fertilized egg from attaching to the uterus. Contraceptive injections are given under the skin (subcutaneous) or into a muscle (intramuscular). For these shots to work, you must get one of them every 3 months (12-13 weeks) from a health care provider. Tell a health care provider about:  Any allergies you have.  All medicines you are taking, including vitamins, herbs, eye drops, creams, and over-the-counter medicines.  Any blood disorders you have.  Any medical conditions you have.  Whether you are pregnant or may be pregnant. What are the risks? Generally, this is a safe procedure. However, problems may occur, including:  Mood changes or depression.  Loss of bone density (osteoporosis) after long-term use.  Blood clots. This is rare.  Higher risk of an egg being fertilized outside your uterus (ectopic pregnancy).This is rare. What happens before the procedure?  Your health care provider may do a routine physical exam.  You may have a test to make sure you are not pregnant. What happens during the procedure?  The area where the shot will be given will be cleaned and sanitized with alcohol.  A needle will be inserted into a muscle in your upper arm or buttock, or into the skin of your thigh or abdomen. The needle will be attached to a syringe with the medicine inside of it.  The medicine will be pushed through the syringe and injected into your body.  A small bandage (dressing) may be placed over the injection site.   What can I expect after the procedure?  After the procedure, it is common to have: ? Soreness around the  injection site for a couple of days. ? Irregular menstrual bleeding. ? Weight gain. ? Breast tenderness. ? Headaches. ? Discomfort in your abdomen.  Ask your health care provider whether you need to use an added method of birth control (backup contraception), such as a condom, sponge, or spermicide. ? If the first shot is given 1-7 days after the start of your last menstrual period, you will not need backup contraception. ? If the first shot is given at any other time during your menstrual cycle, you should avoid having sex. If you do have sex, you will need to use backup contraception for 7 days after you receive the shot. Follow these instructions at home: General instructions  Take over-the-counter and prescription medicines only as told by your health care provider.  Do not rub or massage the injection site.  Track your menstrual periods so you will know if they become irregular.  Always use a condom to protect against sexually transmitted infections (STIs).  Make sure you schedule an appointment in time for your next shot and mark it on your calendar. You must get an injection every 3 months (12-13 weeks) to prevent pregnancy. Lifestyle  Do not use any products that contain nicotine or tobacco. These products include cigarettes, chewing tobacco, and vaping devices, such as e-cigarettes. If you need help quitting, ask your health care provider.  Eat foods that are high in calcium and vitamin D, such as milk, cheese, and salmon. Doing this may help with any loss in bone density caused by the contraceptive injection. Ask your  health care provider for dietary recommendations. Contact a health care provider if you:  Have nausea or vomiting.  Have abnormal vaginal discharge or bleeding.  Miss a menstrual period or think you might be pregnant.  Experience mood changes or depression.  Feel dizzy or light-headed.  Have leg pain. Get help right away if you:  Have chest pain or  cough up blood.  Have shortness of breath.  Have a severe headache that does not go away.  Have numbness in any part of your body.  Have slurred speech or vision problems.  Have vaginal bleeding that is abnormally heavy or does not stop, or you have severe pain in your abdomen.  Have depression that does not get better with treatment. If you ever feel like you may hurt yourself or others, or have thoughts about taking your own life, get help right away. Go to your nearest emergency department or:  Call your local emergency services (911 in the [place]  Call a suicide crisis helpline, such as the National Suicide Prevention Lifeline at (850)531-7124. This is open 24 hours a day in the [place]  Text the Crisis Text Line at 917-423-9954 (in the [place] Summary  A contraceptive injection is a shot that prevents pregnancy. It is also called the birth control shot.  The shot is given under the skin (subcutaneous) or into a muscle (intramuscular).  After this procedure, it is common to have soreness around the injection site for a couple of days.  To prevent pregnancy, the shot must be given by a health care provider every 3 months (12-13 weeks).  After you have the shot, ask your health care provider whether you need to use an added method of birth control (backup contraception), such as a condom, sponge, or spermicide. This information is not intended to replace advice given to you by your health care provider. Make sure you discuss any questions you have with your health care provider. Document Revised: 11/29/2019 Document Reviewed: 11/29/2019 Elsevier Patient Education  2021 [employer].

## 2020-09-07 NOTE — Addendum Note (Signed)
Addended by: Charlsie Quest B on: 09/07/2020 04:50 PM   Modules accepted: Orders

## 2020-10-19 IMAGING — US US MFM OB FOLLOW-UP
1 series · 14 of 28 positions shown · non-contrast
Comparison: none

[Series 1: us mfm ob follow-up · 45 acquisitions, 14 frames shown]
[im 2/45]
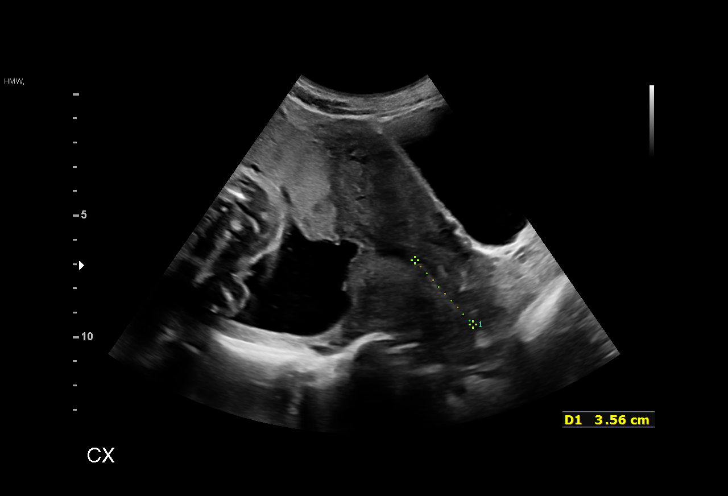
[im 5/45]
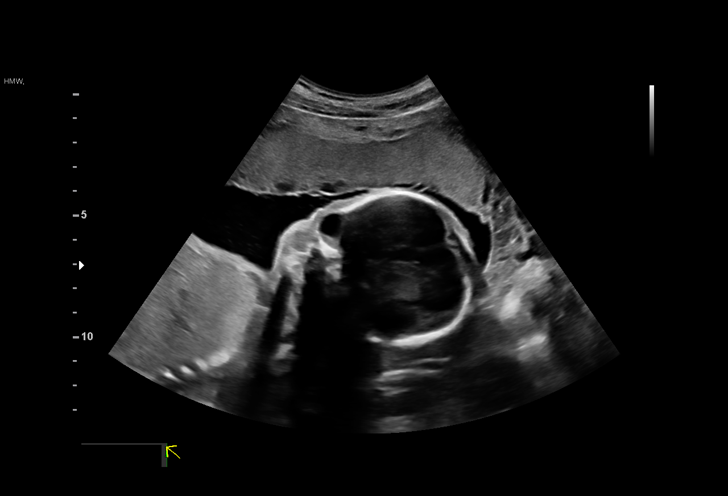
[im 9/45]
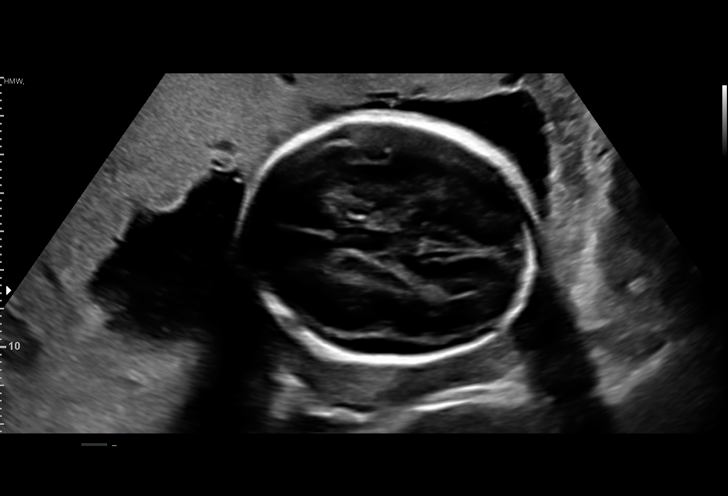
[im 12/45]
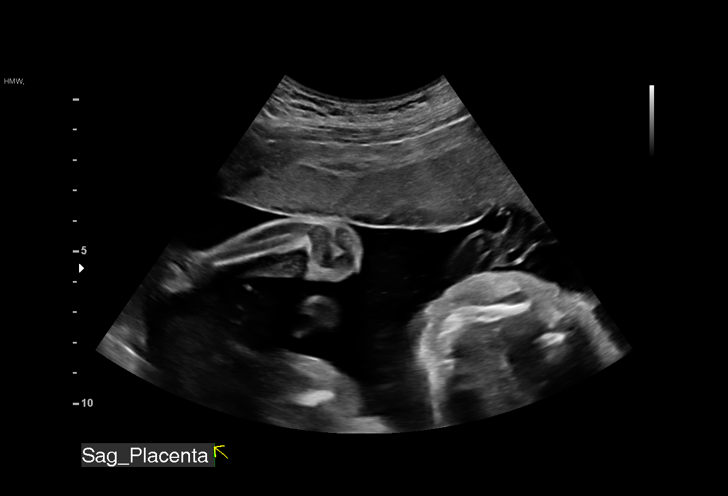
[im 15/45]
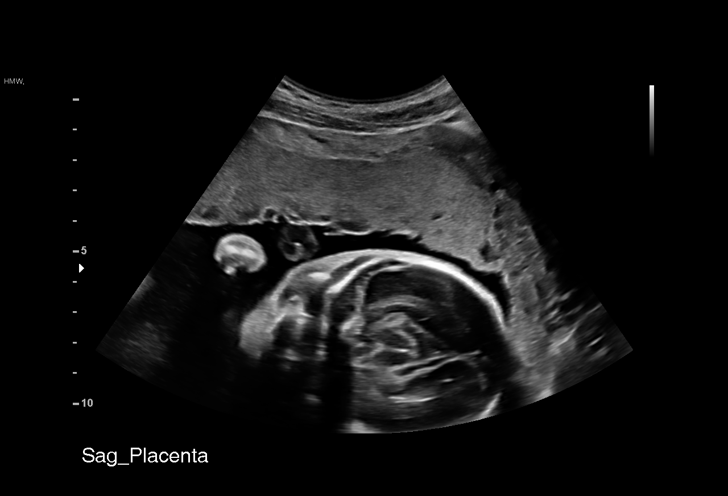
[im 18/45]
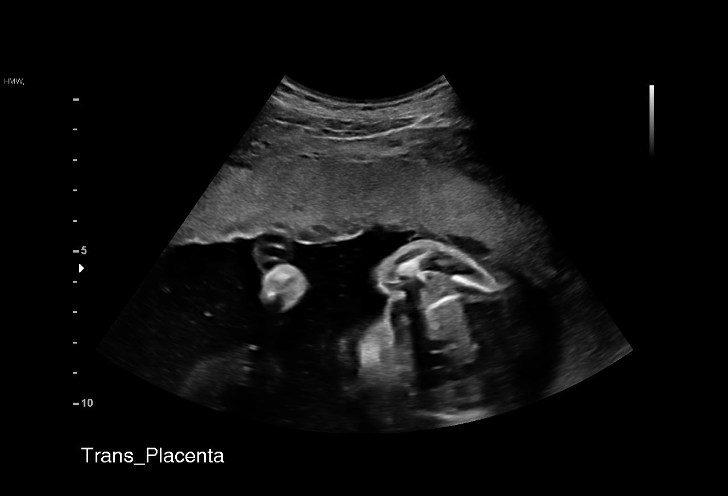
[im 22/45]
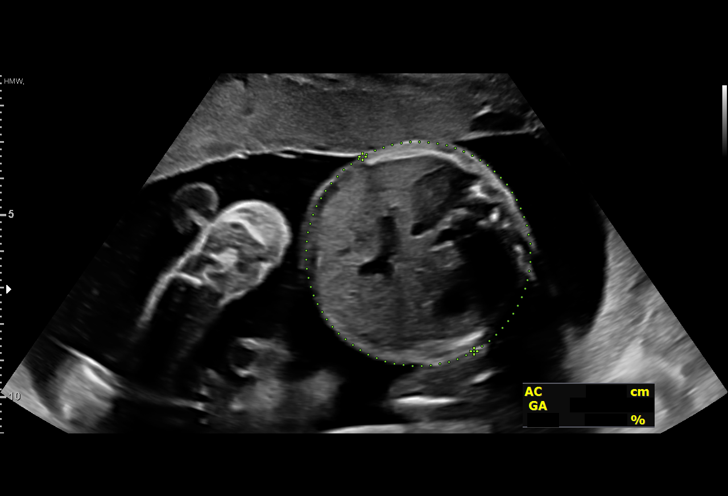
[im 25/45]
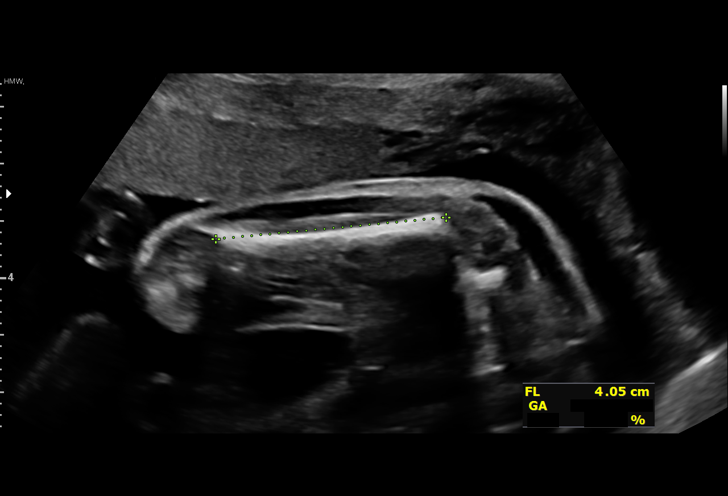
[im 28/45]
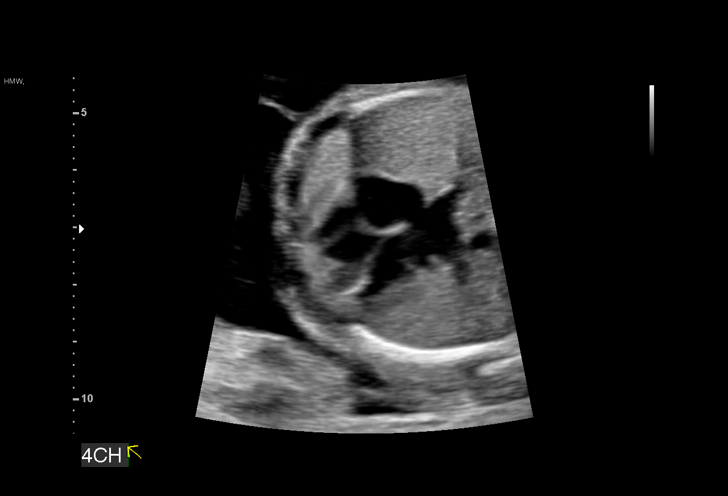
[im 31/45]
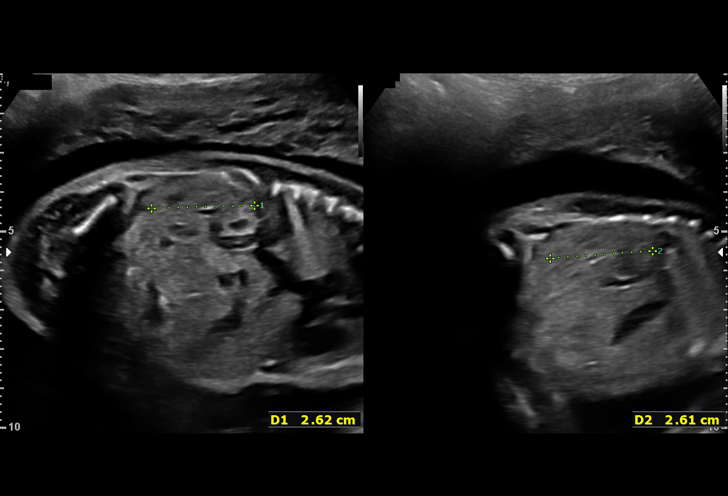
[im 35/45]
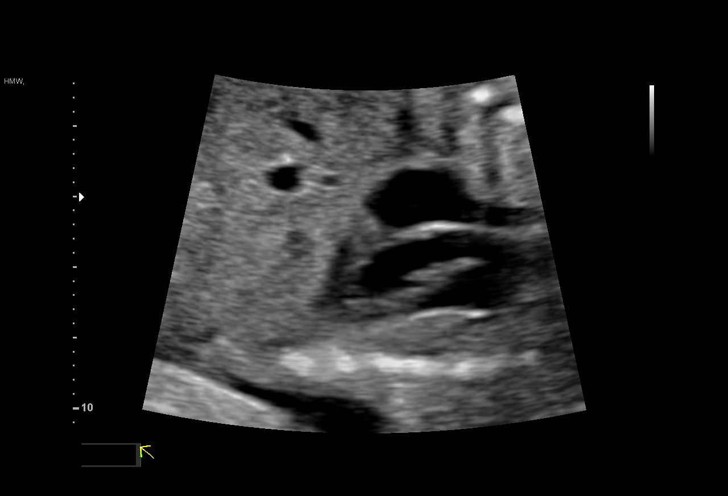
[im 38/45]
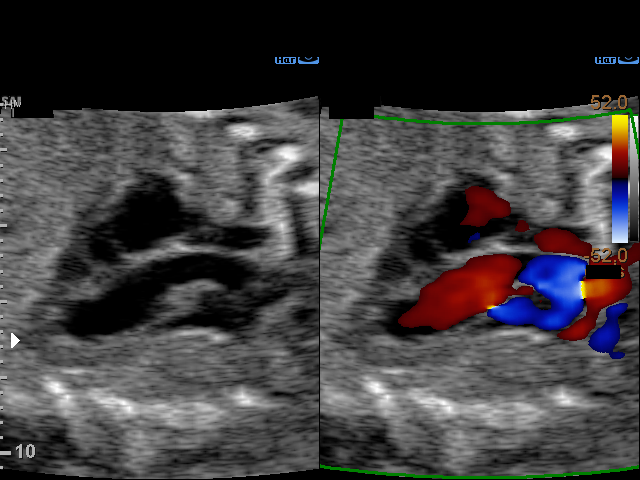
[im 41/45]
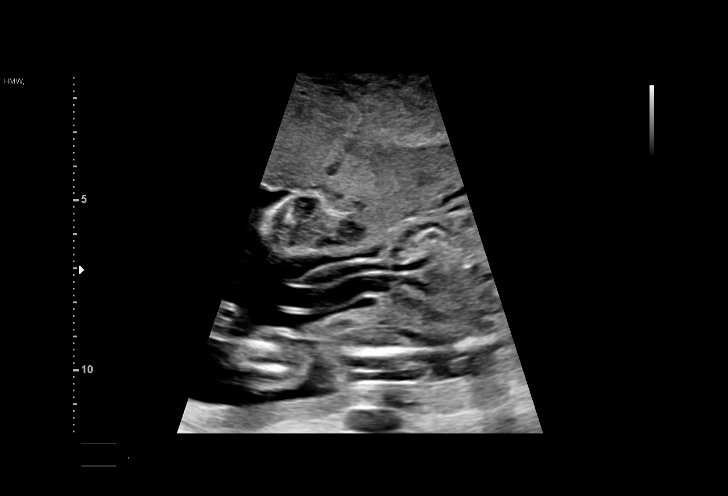
[im 45/45]
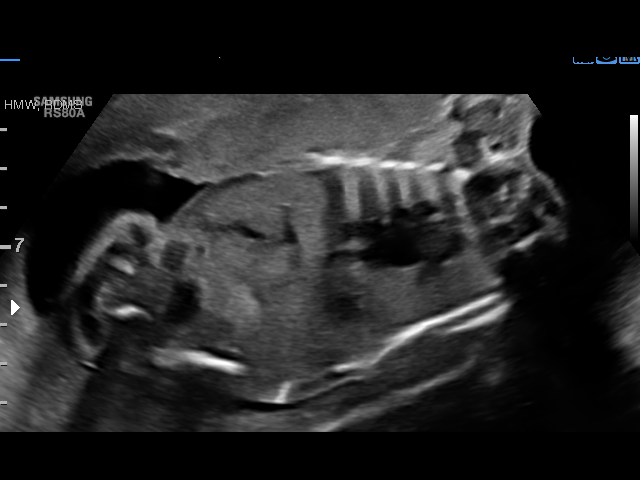

[14 of 28 positions shown; findings below may reference images not displayed]

[REDACTED]
                   14300

Indications

 Tobacco use complicating pregnancy,
 second trimester
 23 weeks gestation of pregnancy
 Poor obstetric history: Previous
 preeclampsia / eclampsia/gestational HTN
 Poor obstetric history: Previous preterm
 delivery, antepartum (Twins at 36 weeks)
 Short interval between pregancies, 2nd
 trimester (Delivered 10-27-2018)
 LOW Risk NIPS     Negative Horizon
 Encounter for other antenatal screening
 follow-up
Fetal Evaluation

 Num Of Fetuses:         1
 Fetal Heart Rate(bpm):  126
 Cardiac Activity:       Observed
 Presentation:           Cephalic
 Placenta:               Anterior
 P. Cord Insertion:      Visualized, central

 Amniotic Fluid
 AFI FV:      Within normal limits

                             Largest Pocket(cm)

Biometry

 BPD:      61.6  mm     G. Age:  25w 0d         92  %    CI:        76.16   %    70 - 86
                                                         FL/HC:      17.6   %    19.2 -
 HC:      223.7  mm     G. Age:  24w 3d         72  %    HC/AC:      1.16        1.05 -
 AC:      192.6  mm     G. Age:  24w 0d         59  %    FL/BPD:     64.0   %    71 - 87
 FL:       39.4  mm     G. Age:  22w 5d         17  %    FL/AC:      20.5   %    20 - 24

 Est. FW:     609  gm      1 lb 5 oz     50  %
Gestational Age

 U/S Today:     24w 0d                                        EDD:   04/26/20
 Best:          23w 3d     Det. By:  Previous Ultrasound      EDD:   04/30/20
                                     (09/06/19)
Anatomy

 Cranium:               Appears normal         Aortic Arch:            Previously seen
 Cavum:                 Previously seen        Ductal Arch:            Previously seen
 Ventricles:            Appears normal         Diaphragm:              Appears normal
 Choroid Plexus:        Previously seen        Stomach:                Appears normal, left
                                                                       sided
 Cerebellum:            Previously seen        Abdomen:                Appears normal
 Posterior Fossa:       Previously seen        Abdominal Wall:         Appears nml (cord
                                                                       insert, abd wall)
 Nuchal Fold:           Previously seen        Cord Vessels:           Previously seen
 Face:                  Orbits and profile     Kidneys:                Appear normal
                        previously seen
 Lips:                  Previously seen        Bladder:                Appears normal
 Thoracic:              Appears normal         Spine:                  Previously seen
 Heart:                 Previously seen        Upper Extremities:      Previously seen
 RVOT:                  Appears normal         Lower Extremities:      Previously seen
 LVOT:                  Appears normal

 Other:  Fetus appears to be a male. Heels visualized previously. Nasal bone
         visualized previously. Open hands visualized previously.
Cervix Uterus Adnexa

 Cervix
 Length:            3.5  cm.
 Normal appearance by transabdominal scan.
Comments

 This patient was seen for a follow up growth scan.
 The fetal growth and amniotic fluid level appears appropriate
 for her gestational age.
 Follow-up as indicated.

## 2020-11-08 ENCOUNTER — Other Ambulatory Visit: Payer: Self-pay

## 2020-11-08 ENCOUNTER — Ambulatory Visit (INDEPENDENT_AMBULATORY_CARE_PROVIDER_SITE_OTHER): Payer: Medicaid Other

## 2020-11-08 DIAGNOSIS — Z3200 Encounter for pregnancy test, result unknown: Secondary | ICD-10-CM

## 2020-11-08 DIAGNOSIS — Z3202 Encounter for pregnancy test, result negative: Secondary | ICD-10-CM

## 2020-11-08 LAB — POCT URINE PREGNANCY: Preg Test, Ur: NEGATIVE

## 2020-11-08 NOTE — Progress Notes (Signed)
..   Jacqueline [name] presents today for UPT. She has no unusual complaints. LMP: Stopped depo in April, has not had a menstrual cycle    OBJECTIVE: Appears well, in no apparent distress.  OB History    Gravida  2   Para  2   Term  1   Preterm  1   AB  0   Living  3     SAB  0   IAB  0   Ectopic  0   Multiple  1   Live Births  3          Home UPT Result: Unknown; did not test at home In-Office UPT result: Negative I have reviewed the patient's medical, obstetrical, social, and family histories, and medications.   ASSESSMENT: Negative pregnancy test  PLAN Follow up as needed

## 2020-11-08 NOTE — Progress Notes (Signed)
Chart reviewed for nurse visit. Agree with plan of care.   Venora Maples, MD 11/08/20 1:02 PM

## 2020-11-27 ENCOUNTER — Ambulatory Visit: Payer: Medicaid Other

## 2020-12-31 ENCOUNTER — Other Ambulatory Visit: Payer: Self-pay

## 2020-12-31 ENCOUNTER — Emergency Department (HOSPITAL_COMMUNITY)
Admission: EM | Admit: 2020-12-31 | Discharge: 2020-12-31 | Disposition: A | Discharge status: Home / Self Care | Payer: Medicaid Other | Attending: Emergency Medicine | Admitting: Emergency Medicine

## 2020-12-31 ENCOUNTER — Encounter (HOSPITAL_COMMUNITY): Payer: Self-pay

## 2020-12-31 DIAGNOSIS — J020 Streptococcal pharyngitis: Secondary | ICD-10-CM | POA: Diagnosis not present

## 2020-12-31 DIAGNOSIS — Z20822 Contact with and (suspected) exposure to covid-19: Secondary | ICD-10-CM | POA: Insufficient documentation

## 2020-12-31 DIAGNOSIS — M791 Myalgia, unspecified site: Secondary | ICD-10-CM | POA: Diagnosis not present

## 2020-12-31 DIAGNOSIS — J45909 Unspecified asthma, uncomplicated: Secondary | ICD-10-CM | POA: Insufficient documentation

## 2020-12-31 DIAGNOSIS — Z28310 Unvaccinated for covid-19: Secondary | ICD-10-CM | POA: Diagnosis not present

## 2020-12-31 DIAGNOSIS — J029 Acute pharyngitis, unspecified: Secondary | ICD-10-CM | POA: Diagnosis present

## 2020-12-31 LAB — RESP PANEL BY RT-PCR (FLU A&B, COVID) ARPGX2
Influenza A by PCR: NEGATIVE
Influenza B by PCR: NEGATIVE
SARS Coronavirus 2 by RT PCR: NEGATIVE

## 2020-12-31 LAB — GROUP A STREP BY PCR: Group A Strep by PCR: DETECTED — AB

## 2020-12-31 MED ORDER — DEXAMETHASONE SODIUM PHOSPHATE 10 MG/ML IJ SOLN
8.0000 mg | Freq: Once | INTRAMUSCULAR | Status: AC
  Administered 2020-12-31: 8 mg via INTRAMUSCULAR
  Filled 2020-12-31: qty 1

## 2020-12-31 MED ORDER — PENICILLIN G BENZATHINE 1200000 UNIT/2ML IM SUSY
1.2000 10*6.[IU] | PREFILLED_SYRINGE | Freq: Once | INTRAMUSCULAR | Status: AC
  Administered 2020-12-31: 1.2 10*6.[IU] via INTRAMUSCULAR
  Filled 2020-12-31: qty 2

## 2020-12-31 MED ORDER — ACETAMINOPHEN 325 MG PO TABS
650.0000 mg | ORAL_TABLET | Freq: Once | ORAL | Status: AC
  Administered 2020-12-31: 650 mg via ORAL
  Filled 2020-12-31: qty 2

## 2020-12-31 MED ORDER — LIDOCAINE VISCOUS HCL 2 % MT SOLN
15.0000 mL | Freq: Once | OROMUCOSAL | Status: AC
  Administered 2020-12-31: 15 mL via OROMUCOSAL
  Filled 2020-12-31: qty 15

## 2020-12-31 NOTE — ED Provider Notes (Signed)
[name]-EMERGENCY DEPT Provider Note   CSN: [id number] Arrival date & time: 12/31/20  1012     History Chief Complaint  Patient presents with   Generalized Body Aches   Sore Throat    [place] [name] is a 20 y.o. female.  HPI Patient is a 20 year old female with a medical history as noted below.  She presents to the emergency department due to sore throat, body aches, back pain, as well as a headache that all started this morning upon waking.  Patient states that she had a new washing machine delivered yesterday and the employee was coughing and appeared sick.  Denies any known history of previous COVID-19 infection.  She has not been vaccinated for COVID-19.  She has not taken any medications today for symptoms.  No nausea, vomiting, or diarrhea.    Past Medical History:  Diagnosis Date   Asthma    Migraines    Pregnancy induced hypertension     Patient Active Problem List   Diagnosis Date Noted   Birth control counseling 09/07/2020   Indication for care in labor or delivery 04/27/2020   SVD (spontaneous vaginal delivery) 04/27/2020   Pyelonephritis affecting pregnancy in second trimester 01/04/2020   Nausea and vomiting during pregnancy prior to [redacted] weeks gestation 11/10/2019   Hx of preeclampsia, prior pregnancy, currently pregnant 10/06/2019   Hx of postpartum hemorrhage, currently pregnant 10/06/2019   Short interval between pregnancies complicating pregnancy, antepartum 10/06/2019   Chlamydia trachomatis infection during pregnancy in first trimester 10/06/2019   Current nicotine use 10/06/2019   Supervision of other normal pregnancy, antepartum 09/29/2019    Past Surgical History:  Procedure Laterality Date   WISDOM TOOTH EXTRACTION       OB History     Gravida  2   Para  2   Term  1   Preterm  1   AB  0   Living  3      SAB  0   IAB  0   Ectopic  0   Multiple  1   Live Births  3           Family History   Problem Relation Age of Onset   Hypertension Mother    Asthma Brother    Hypertension Brother    Diabetes Brother    Hypertension Maternal Aunt    Diabetes Maternal Aunt    Autism spectrum disorder Maternal Aunt    Hypertension Maternal Grandmother    Aneurysm Maternal Uncle     Social History   Tobacco Use   Smoking status: Never   Smokeless tobacco: Never  Vaping Use   Vaping Use: Former  Substance Use Topics   Alcohol use: Never    Alcohol/week: 0.0 standard drinks   Drug use: Not Currently    Types: Marijuana    Comment: NOT SMOKED DURING PREG    Home Medications Prior to Admission medications   Medication Sig Start Date End Date Taking? Authorizing Provider  labetalol (NORMODYNE) 300 MG tablet Take 1 tablet (300 mg total) by mouth 2 (two) times daily. Patient not taking: Reported on 08/12/2019 10/31/18 08/12/19  Conan Bowens, MD    Allergies    Feraheme [ferumoxytol] and Tomato  Review of Systems   Review of Systems  All other systems reviewed and are negative. Ten systems reviewed and are negative for acute change, except as noted in the HPI.   Physical Exam Updated Vital Signs BP 100/72   Pulse  83   Temp 99.9 F (37.7 C) (Oral)   Resp 16   Ht 5\' 4"  (1.626 m)   Wt 68 kg   SpO2 98%   BMI 25.73 kg/m   Physical Exam Vitals and nursing note reviewed.  Constitutional:      General: She is not in acute distress.    Appearance: Normal appearance. She is not ill-appearing, toxic-appearing or diaphoretic.  HENT:     Head: Normocephalic and atraumatic.     Right Ear: External ear normal.     Left Ear: External ear normal.     Nose: Nose normal.     Mouth/Throat:     Mouth: Mucous membranes are moist.     Pharynx: Oropharynx is clear. Posterior oropharyngeal erythema present. No oropharyngeal exudate.     Tonsils: Tonsillar exudate present. No tonsillar abscesses. 2+ on the right. 2+ on the left.     Comments: Uvula midline.  2+ symmetrical tonsillar  hypertrophy noted.  Tonsillar exudates noted.  Mild erythema noted in the posterior oropharynx.  Readily handling secretions.  No hot potato voice. Eyes:     Extraocular Movements: Extraocular movements intact.  Cardiovascular:     Rate and Rhythm: Normal rate and regular rhythm.     Pulses: Normal pulses.     Heart sounds: Normal heart sounds. No murmur heard.   No friction rub. No gallop.  Pulmonary:     Effort: Pulmonary effort is normal. No respiratory distress.     Breath sounds: Normal breath sounds. No stridor. No wheezing, rhonchi or rales.  Abdominal:     General: Abdomen is flat.     Palpations: Abdomen is soft.     Tenderness: There is no abdominal tenderness.  Musculoskeletal:        General: Normal range of motion.     Cervical back: Normal range of motion and neck supple. No tenderness.  Skin:    General: Skin is warm and dry.  Neurological:     General: No focal deficit present.     Mental Status: She is alert and oriented to person, place, and time.  Psychiatric:        Mood and Affect: Mood normal.        Behavior: Behavior normal.   ED Results / Procedures / Treatments   Labs (all labs ordered are listed, but only abnormal results are displayed) Labs Reviewed  GROUP A STREP BY PCR - Abnormal; Notable for the following components:      Result Value   Group A Strep by PCR DETECTED (*)    All other components within normal limits  RESP PANEL BY RT-PCR (FLU A&B, COVID) ARPGX2    EKG None  Radiology No results found.  Procedures Procedures   Medications Ordered in ED Medications  lidocaine (XYLOCAINE) 2 % viscous mouth solution 15 mL (15 mLs Mouth/Throat Given 12/31/20 1156)  acetaminophen (TYLENOL) tablet 650 mg (650 mg Oral Given 12/31/20 1156)  dexamethasone (DECADRON) injection 8 mg (8 mg Intramuscular Given 12/31/20 1156)  penicillin g benzathine (BICILLIN LA) 1200000 UNIT/2ML injection 1.2 Million Units (1.2 Million Units Intramuscular Given 12/31/20  1329)    ED Course  I have reviewed the triage vital signs and the nursing notes.  Pertinent labs & imaging results that were available during my care of the patient were reviewed by me and considered in my medical decision making (see chart for details).  Clinical Course as of 12/31/20 1332  Sun Dec 31, 2020  1258 Group  A Strep by PCR(!): DETECTED [LJ]    Clinical Course User Index [LJ] Placido Sou, PA-C   MDM Rules/Calculators/A&P                           Patient is a 20 year old female who presents to the emergency department due to a sore throat.  Patient also has a significant other in the emergency department with a similar complaint.  Patient has a positive group A strep by PCR.  Respiratory panel is negative.  She was treated with viscous lidocaine, Decadron, as well as Tylenol in the emergency department.  She states her symptoms have improved significantly since arrival.  Will treat with IM Bicillin.  Discussed not reusing toothbrushes until her symptoms resolve.  They also have young children in the home and I recommended that they be evaluated by the pediatrician if they begin developing any symptoms.  Discussed return precautions.  Feel that she is stable for discharge at this time and she is agreeable.  Her questions were answered and she was amicable at the time of discharge.  Final Clinical Impression(s) / ED Diagnoses Final diagnoses:  Strep pharyngitis   Rx / DC Orders ED Discharge Orders     None        Placido Sou, PA-C 12/31/20 1333    Mancel Bale, MD 12/31/20 1724

## 2020-12-31 NOTE — ED Triage Notes (Signed)
Pt states starting this morning she has experienced sore throat, body aches, back pain and headache. Pt states she had a new washing machine delivered yesterday and the employee was sick and coughing. Pt states she has not taken any medications today for her symptoms. Pt is A&Ox4.

## 2020-12-31 NOTE — Discharge Instructions (Addendum)
Please continue to take Tylenol as well as ibuprofen for management of your symptoms.  Please follow the instructions on the bottle.  I would also recommend drinking warm tea with honey as well as salt water gargles 2-3 times per day.  Make sure you are not reusing your toothbrushes until you are symptoms resolve.  I would also keep a close eye on your children and if they develop any symptoms please have them evaluated by their pediatrician.  If you develop any new or worsening symptoms please come back to the emergency department.

## 2020-12-31 NOTE — ED Notes (Signed)
Logan, PA at bedside 

## 2021-02-03 ENCOUNTER — Other Ambulatory Visit: Payer: Self-pay

## 2021-02-03 ENCOUNTER — Encounter (HOSPITAL_BASED_OUTPATIENT_CLINIC_OR_DEPARTMENT_OTHER): Payer: Self-pay

## 2021-02-03 ENCOUNTER — Emergency Department (HOSPITAL_BASED_OUTPATIENT_CLINIC_OR_DEPARTMENT_OTHER)
Admission: EM | Admit: 2021-02-03 | Discharge: 2021-02-03 | Disposition: A | Discharge status: Home / Self Care | Payer: Medicaid Other | Attending: Emergency Medicine | Admitting: Emergency Medicine

## 2021-02-03 DIAGNOSIS — J029 Acute pharyngitis, unspecified: Secondary | ICD-10-CM

## 2021-02-03 DIAGNOSIS — J45909 Unspecified asthma, uncomplicated: Secondary | ICD-10-CM | POA: Insufficient documentation

## 2021-02-03 DIAGNOSIS — Z20822 Contact with and (suspected) exposure to covid-19: Secondary | ICD-10-CM | POA: Diagnosis not present

## 2021-02-03 LAB — RESP PANEL BY RT-PCR (FLU A&B, COVID) ARPGX2
Influenza A by PCR: NEGATIVE
Influenza B by PCR: NEGATIVE
SARS Coronavirus 2 by RT PCR: NEGATIVE

## 2021-02-03 LAB — GROUP A STREP BY PCR: Group A Strep by PCR: NOT DETECTED

## 2021-02-03 NOTE — ED Triage Notes (Addendum)
Pt is present for a sore throat x one week and noticed raised bumps on her tongue this morning. Pt came in this evening because she did not have the red bumps on her tongue before. Pt states that it is painful to swallow but denies difficulty breathing. Pt has gargled salt water and used throat spray with minimal relief. Pt states she was treated for strep beginning of August.

## 2021-02-03 NOTE — ED Provider Notes (Signed)
[facility] EMERGENCY DEPT Provider Note   CSN: [id number] Arrival date & time: 02/03/21  2036     History Chief Complaint  Patient presents with   Sore Throat    [name] is a 20 y.o. female presenting with one week of sore throat. No associated symptoms. She denies fever, chills, cough, congestion, diarrhea or other complaints. Has been using warm water and OTC throat spray with minimal relief. Tolerating PO without difficulty. No known sick contacts.    Past Medical History:  Diagnosis Date   Asthma    Migraines    Pregnancy induced hypertension     Patient Active Problem List   Diagnosis Date Noted   Birth control counseling 09/07/2020   Indication for care in labor or delivery 04/27/2020   SVD (spontaneous vaginal delivery) 04/27/2020   Pyelonephritis affecting pregnancy in second trimester 01/04/2020   Nausea and vomiting during pregnancy prior to [redacted] weeks gestation 11/10/2019   Hx of preeclampsia, prior pregnancy, currently pregnant 10/06/2019   Hx of postpartum hemorrhage, currently pregnant 10/06/2019   Short interval between pregnancies complicating pregnancy, antepartum 10/06/2019   Chlamydia trachomatis infection during pregnancy in first trimester 10/06/2019   Current nicotine use 10/06/2019   Supervision of other normal pregnancy, antepartum 09/29/2019    Past Surgical History:  Procedure Laterality Date   WISDOM TOOTH EXTRACTION       OB History     Gravida  2   Para  2   Term  1   Preterm  1   AB  0   Living  3      SAB  0   IAB  0   Ectopic  0   Multiple  1   Live Births  3           Family History  Problem Relation Age of Onset   Hypertension Mother    Asthma Brother    Hypertension Brother    Diabetes Brother    Hypertension Maternal Aunt    Diabetes Maternal Aunt    Autism spectrum disorder Maternal Aunt    Hypertension Maternal Grandmother    Aneurysm Maternal Uncle     Social History    Tobacco Use   Smoking status: Never   Smokeless tobacco: Never  Vaping Use   Vaping Use: Former  Substance Use Topics   Alcohol use: Never    Alcohol/week: 0.0 standard drinks   Drug use: Not Currently    Types: Marijuana    Comment: NOT SMOKED DURING PREG    Home Medications Prior to Admission medications   Medication Sig Start Date End Date Taking? Authorizing Provider  labetalol (NORMODYNE) 300 MG tablet Take 1 tablet (300 mg total) by mouth 2 (two) times daily. Patient not taking: Reported on 08/12/2019 10/31/18 08/12/19  Conan Bowens, MD    Allergies    Feraheme [ferumoxytol] and Tomato  Review of Systems   Review of Systems 10 point ROS performed and was negative aside from HPI.  Physical Exam Updated Vital Signs BP 114/70 (BP Location: Right Arm)   Pulse 76   Temp 98.7 F (37.1 C)   Resp 17   Ht 5\' 3"  (1.6 m)   Wt 59 kg   LMP 01/01/2021   SpO2 98%   BMI 23.03 kg/m   Physical Exam Constitutional:      General: She is not in acute distress.    Appearance: She is not toxic-appearing.  HENT:     Head: Normocephalic  and atraumatic.     Nose: No congestion.     Mouth/Throat:     Mouth: Mucous membranes are moist. No oral lesions.     Pharynx: Uvula midline. No pharyngeal swelling, oropharyngeal exudate or posterior oropharyngeal erythema.  Eyes:     Conjunctiva/sclera: Conjunctivae normal.  Cardiovascular:     Rate and Rhythm: Normal rate and regular rhythm.     Heart sounds: Normal heart sounds.  Pulmonary:     Effort: Pulmonary effort is normal.     Breath sounds: Normal breath sounds.  Skin:    General: Skin is warm.     Findings: No rash.  Neurological:     Mental Status: She is alert.    ED Results / Procedures / Treatments   Labs (all labs ordered are listed, but only abnormal results are displayed) Labs Reviewed  GROUP A STREP BY PCR  RESP PANEL BY RT-PCR (FLU A&B, COVID) ARPGX2    EKG None  Radiology No results  found.  Procedures Procedures   Medications Ordered in ED Medications - No data to display  ED Course  I have reviewed the triage vital signs and the nursing notes.  Pertinent labs & imaging results that were available during my care of the patient were reviewed by me and considered in my medical decision making (see chart for details).    MDM Rules/Calculators/A&P                         Otherwise healthy 20 year old female presents with 1 week of sore throat.  She denies all other symptoms including fever, cough, congestion, GI issues, or rash. Her exam is unremarkable- she is well appearing, no acute distress, oropharynx within normal limits without erythema, edema or exudates.  Ddx includes strep, COVID, other viral pharyngitis, mono. No concern for more serious etiology such as peritonsillar or retropharyngeal abscess or epiglottitis. Rapid strep obtained and was negative. COVID swab pending but patient prefers not to wait for the results. She will check via MyChart. Stable for discharge home with presumed viral pharyngitis. Recommended supportive care with Tylenol/Ibuprofen and OTC lozenges, throat spray, etc.  Final Clinical Impression(s) / ED Diagnoses Final diagnoses:  Sore throat    Rx / DC Orders ED Discharge Orders     None      Maury Dus, MD PGY-2 [facility]   Maury Dus, MD 02/03/21 1595    Arby Barrette, MD 02/04/21 1534

## 2021-02-03 NOTE — ED Notes (Signed)
This RN visualized throat and did not see airway being compromised. Airway clear.

## 2021-02-03 NOTE — Discharge Instructions (Signed)
You were seen in the Emergency Department for sore throat. Your strep test was negative. Your COVID test is in process. Please check MyChart for the results. If it's positive, you'll need to quarantine for 5 days. If it's negative, this is likely some other virus. You should take Tylenol/Ibuprofen as needed and you can also try using throat spray, lozenges, honey, or warm salt water for relief.

## 2021-05-24 ENCOUNTER — Emergency Department (HOSPITAL_COMMUNITY)
Admission: EM | Admit: 2021-05-24 | Discharge: 2021-05-24 | Disposition: A | Discharge status: Home / Self Care | Payer: Medicaid Other | Attending: Emergency Medicine | Admitting: Emergency Medicine

## 2021-05-24 ENCOUNTER — Encounter (HOSPITAL_COMMUNITY): Payer: Self-pay | Admitting: Emergency Medicine

## 2021-05-24 ENCOUNTER — Other Ambulatory Visit: Payer: Self-pay

## 2021-05-24 DIAGNOSIS — R059 Cough, unspecified: Secondary | ICD-10-CM | POA: Diagnosis present

## 2021-05-24 DIAGNOSIS — Z28310 Unvaccinated for covid-19: Secondary | ICD-10-CM | POA: Insufficient documentation

## 2021-05-24 DIAGNOSIS — Z20822 Contact with and (suspected) exposure to covid-19: Secondary | ICD-10-CM | POA: Diagnosis not present

## 2021-05-24 DIAGNOSIS — J45901 Unspecified asthma with (acute) exacerbation: Secondary | ICD-10-CM | POA: Insufficient documentation

## 2021-05-24 DIAGNOSIS — J101 Influenza due to other identified influenza virus with other respiratory manifestations: Secondary | ICD-10-CM | POA: Insufficient documentation

## 2021-05-24 LAB — RESP PANEL BY RT-PCR (FLU A&B, COVID) ARPGX2
Influenza A by PCR: POSITIVE — AB
Influenza B by PCR: NEGATIVE
SARS Coronavirus 2 by RT PCR: NEGATIVE

## 2021-05-24 MED ORDER — PREDNISONE 10 MG PO TABS: 50.0000 mg | ORAL_TABLET | Freq: Every day | ORAL | 0 refills | Status: DC

## 2021-05-24 MED ORDER — ALBUTEROL SULFATE HFA 108 (90 BASE) MCG/ACT IN AERS: 2.0000 | INHALATION_SPRAY | RESPIRATORY_TRACT | 0 refills | Status: DC | PRN

## 2021-05-24 NOTE — ED Triage Notes (Signed)
Patient c/o cough, runny nose, chills and fever x 4 days. Reports clear/yellow mucus.

## 2021-05-24 NOTE — Discharge Instructions (Addendum)
Your test result is positive for flu.  Prescribed his inhaler and prednisone for your asthma exacerbation. Take Tylenol every 6 hours for fevers.  You can also take ibuprofen.  Work note has been provided. Return to the ER if your symptoms get worse.

## 2021-05-24 NOTE — ED Provider Notes (Signed)
[facility]-EMERGENCY DEPT Provider Note   CSN: [id number] Arrival date & time: 05/24/21  1024     History Chief Complaint  Patient presents with   Cough    [place] [name] is a 20 y.o. female.  HPI     SUBJECTIVE:  [name] is a 20 y.o. female who complains of congestion, sneezing, post nasal drip, productive cough, and myalgias for 3  days. She denies a history of fevers and wheezing and has a history of asthma.  Patient's T-max is 103.  Reports that her asthma has been worsened because of her symptoms, requiring more inhaler treatments. She has not received the COVID-19 or flu vaccine   Past Medical History:  Diagnosis Date   Asthma    Migraines    Pregnancy induced hypertension     Patient Active Problem List   Diagnosis Date Noted   Birth control counseling 09/07/2020   Indication for care in labor or delivery 04/27/2020   SVD (spontaneous vaginal delivery) 04/27/2020   Pyelonephritis affecting pregnancy in second trimester 01/04/2020   Nausea and vomiting during pregnancy prior to [redacted] weeks gestation 11/10/2019   Hx of preeclampsia, prior pregnancy, currently pregnant 10/06/2019   Hx of postpartum hemorrhage, currently pregnant 10/06/2019   Short interval between pregnancies complicating pregnancy, antepartum 10/06/2019   Chlamydia trachomatis infection during pregnancy in first trimester 10/06/2019   Current nicotine use 10/06/2019   Supervision of other normal pregnancy, antepartum 09/29/2019    Past Surgical History:  Procedure Laterality Date   WISDOM TOOTH EXTRACTION       OB History     Gravida  2   Para  2   Term  1   Preterm  1   AB  0   Living  3      SAB  0   IAB  0   Ectopic  0   Multiple  1   Live Births  3           Family History  Problem Relation Age of Onset   Hypertension Mother    Asthma Brother    Hypertension Brother    Diabetes Brother    Hypertension Maternal Aunt     Diabetes Maternal Aunt    Autism spectrum disorder Maternal Aunt    Hypertension Maternal Grandmother    Aneurysm Maternal Uncle     Social History   Tobacco Use   Smoking status: Never   Smokeless tobacco: Never  Vaping Use   Vaping Use: Former  Substance Use Topics   Alcohol use: Never    Alcohol/week: 0.0 standard drinks   Drug use: Not Currently    Types: Marijuana    Comment: NOT SMOKED DURING PREG    Home Medications Prior to Admission medications   Medication Sig Start Date End Date Taking? Authorizing Provider  albuterol (VENTOLIN HFA) 108 (90 Base) MCG/ACT inhaler Inhale 2 puffs into the lungs every 4 (four) hours as needed for wheezing or shortness of breath. 05/24/21  Yes Derwood Kaplan, MD  predniSONE (DELTASONE) 10 MG tablet Take 5 tablets (50 mg total) by mouth daily. 05/24/21  Yes Derwood Kaplan, MD  labetalol (NORMODYNE) 300 MG tablet Take 1 tablet (300 mg total) by mouth 2 (two) times daily. Patient not taking: Reported on 08/12/2019 10/31/18 08/12/19  Conan Bowens, MD    Allergies    Feraheme [ferumoxytol] and Tomato  Review of Systems   Review of Systems  Constitutional:  Positive for activity change,  chills, fatigue and fever.  Respiratory:  Positive for cough and chest tightness. Negative for shortness of breath.   Musculoskeletal:  Positive for myalgias.  Allergic/Immunologic: Negative for immunocompromised state.   Physical Exam Updated Vital Signs BP 123/81 (BP Location: Left Arm)    Pulse 87    Temp 98.3 F (36.8 C) (Oral)    Resp 16    Ht 5\' 4"  (1.626 m)    Wt 71.7 kg    LMP 05/21/2021    SpO2 97%    BMI 27.12 kg/m   Physical Exam Vitals and nursing note reviewed.  Constitutional:      Appearance: She is well-developed.  HENT:     Head: Atraumatic.  Cardiovascular:     Rate and Rhythm: Normal rate.  Pulmonary:     Effort: Pulmonary effort is normal.  Musculoskeletal:     Cervical back: Normal range of motion and neck supple.   Skin:    General: Skin is warm and dry.  Neurological:     Mental Status: She is alert and oriented to person, place, and time.    ED Results / Procedures / Treatments   Labs (all labs ordered are listed, but only abnormal results are displayed) Labs Reviewed  RESP PANEL BY RT-PCR (FLU A&B, COVID) ARPGX2 - Abnormal; Notable for the following components:      Result Value   Influenza A by PCR POSITIVE (*)    All other components within normal limits    EKG None  Radiology No results found.  Procedures Procedures   Medications Ordered in ED Medications - No data to display  ED Course  I have reviewed the triage vital signs and the nursing notes.  Pertinent labs & imaging results that were available during my care of the patient were reviewed by me and considered in my medical decision making (see chart for details).    MDM Rules/Calculators/A&P                         ASSESSMENT:  influenza  PLAN: Symptomatic therapy suggested: push fluids, rest, and return office visit prn if symptoms persist or worsen. Lack of antibiotic effectiveness discussed with her.  Since the asthma has gotten worse, we have prescribed her with prednisone and albuterol inhaler.  Work note also provided. Call or return to clinic prn if these symptoms worsen or fail to improve as anticipated.     Final Clinical Impression(s) / ED Diagnoses Final diagnoses:  Influenza A  Moderate asthma with exacerbation, unspecified whether persistent    Rx / DC Orders ED Discharge Orders          Ordered    albuterol (VENTOLIN HFA) 108 (90 Base) MCG/ACT inhaler  Every 4 hours PRN        05/24/21 1139    predniSONE (DELTASONE) 10 MG tablet  Daily        05/24/21 1139             05/26/21, MD 05/24/21 1148

## 2022-03-28 ENCOUNTER — Inpatient Hospital Stay (HOSPITAL_COMMUNITY)
Admission: AD | Admit: 2022-03-28 | Discharge: 2022-03-29 | Disposition: A | Discharge status: Home / Self Care | Payer: BLUE CROSS/BLUE SHIELD | Attending: Family Medicine | Admitting: Family Medicine

## 2022-03-28 ENCOUNTER — Encounter (HOSPITAL_COMMUNITY): Payer: Self-pay

## 2022-03-28 ENCOUNTER — Inpatient Hospital Stay (HOSPITAL_BASED_OUTPATIENT_CLINIC_OR_DEPARTMENT_OTHER): Payer: BLUE CROSS/BLUE SHIELD

## 2022-03-28 DIAGNOSIS — R102 Pelvic and perineal pain: Secondary | ICD-10-CM

## 2022-03-28 DIAGNOSIS — O99322 Drug use complicating pregnancy, second trimester: Secondary | ICD-10-CM | POA: Insufficient documentation

## 2022-03-28 DIAGNOSIS — O26892 Other specified pregnancy related conditions, second trimester: Secondary | ICD-10-CM | POA: Insufficient documentation

## 2022-03-28 DIAGNOSIS — O99352 Diseases of the nervous system complicating pregnancy, second trimester: Secondary | ICD-10-CM | POA: Diagnosis not present

## 2022-03-28 DIAGNOSIS — Z1152 Encounter for screening for COVID-19: Secondary | ICD-10-CM | POA: Insufficient documentation

## 2022-03-28 DIAGNOSIS — G43809 Other migraine, not intractable, without status migrainosus: Secondary | ICD-10-CM

## 2022-03-28 DIAGNOSIS — R52 Pain, unspecified: Secondary | ICD-10-CM | POA: Diagnosis not present

## 2022-03-28 DIAGNOSIS — B349 Viral infection, unspecified: Secondary | ICD-10-CM | POA: Diagnosis not present

## 2022-03-28 DIAGNOSIS — Z3A17 17 weeks gestation of pregnancy: Secondary | ICD-10-CM | POA: Diagnosis not present

## 2022-03-28 DIAGNOSIS — G43909 Migraine, unspecified, not intractable, without status migrainosus: Secondary | ICD-10-CM | POA: Insufficient documentation

## 2022-03-28 DIAGNOSIS — O98512 Other viral diseases complicating pregnancy, second trimester: Secondary | ICD-10-CM | POA: Insufficient documentation

## 2022-03-28 DIAGNOSIS — O09212 Supervision of pregnancy with history of pre-term labor, second trimester: Secondary | ICD-10-CM | POA: Diagnosis not present

## 2022-03-28 LAB — URINALYSIS, ROUTINE W REFLEX MICROSCOPIC
Bilirubin Urine: NEGATIVE
Glucose, UA: NEGATIVE mg/dL
Hgb urine dipstick: NEGATIVE
Ketones, ur: NEGATIVE mg/dL
Leukocytes,Ua: NEGATIVE
Nitrite: NEGATIVE
Protein, ur: NEGATIVE mg/dL
Specific Gravity, Urine: 1.014 (ref 1.005–1.030)
pH: 8 (ref 5.0–8.0)

## 2022-03-28 LAB — RESP PANEL BY RT-PCR (FLU A&B, COVID) ARPGX2
Influenza A by PCR: NEGATIVE
Influenza B by PCR: NEGATIVE
SARS Coronavirus 2 by RT PCR: NEGATIVE

## 2022-03-28 MED ORDER — ACETAMINOPHEN-CAFFEINE 500-65 MG PO TABS
2.0000 | ORAL_TABLET | Freq: Once | ORAL | Status: AC
  Administered 2022-03-28: 2 via ORAL
  Filled 2022-03-28: qty 2

## 2022-03-28 MED ORDER — IBUPROFEN 600 MG PO TABS
600.0000 mg | ORAL_TABLET | Freq: Once | ORAL | Status: AC
  Administered 2022-03-28: 600 mg via ORAL
  Filled 2022-03-28: qty 1

## 2022-03-28 MED ORDER — METOCLOPRAMIDE HCL 10 MG PO TABS
10.0000 mg | ORAL_TABLET | Freq: Once | ORAL | Status: AC
  Administered 2022-03-28: 10 mg via ORAL
  Filled 2022-03-28: qty 1

## 2022-03-28 NOTE — MAU Provider Note (Addendum)
History     CSN: [id number]  Arrival date and time: 03/28/22 1857   Event Date/Time   First Provider Initiated Contact with Patient 03/28/22 2132      Chief Complaint  Patient presents with   Headache   HPI  [name] is a 21 y.o. [id number] at [redacted]w[redacted]d who presents for evaluation of a headache. Patient reports she has a history of headaches but never one this bad. She reports she tried ibuprofen yesterday with no relief. Patient rates the pain as a 10/10. She also reports lower abdominal cramping. She believes she is [redacted] weeks pregnant but has an unsure LMP.  She denies any vaginal bleeding, discharge, and leaking of fluid. Denies any constipation, diarrhea or any urinary complaints. Reports normal fetal movement.   OB History     Gravida  3   Para  2   Term  1   Preterm  1   AB  0   Living  3      SAB  0   IAB  0   Ectopic  0   Multiple  1   Live Births  3           Past Medical History:  Diagnosis Date   Asthma    Migraines    Pregnancy induced hypertension     Past Surgical History:  Procedure Laterality Date   WISDOM TOOTH EXTRACTION      Family History  Problem Relation Age of Onset   Hypertension Mother    Asthma Brother    Hypertension Brother    Diabetes Brother    Hypertension Maternal Aunt    Diabetes Maternal Aunt    Autism spectrum disorder Maternal Aunt    Hypertension Maternal Grandmother    Aneurysm Maternal Uncle     Social History   Tobacco Use   Smoking status: Never   Smokeless tobacco: Never  Vaping Use   Vaping Use: Former  Substance Use Topics   Alcohol use: Never    Alcohol/week: 0.0 standard drinks of alcohol   Drug use: Not Currently    Types: Marijuana    Comment: NOT SMOKED DURING PREG    Allergies:  Allergies  Allergen Reactions   Feraheme [Ferumoxytol] Other (See Comments)    Nausea, back pain, dizziness   Tomato Other (See Comments)    Makes asthma worse    Medications Prior to  Admission  Medication Sig Dispense Refill Last Dose   albuterol (VENTOLIN HFA) 108 (90 Base) MCG/ACT inhaler Inhale 2 puffs into the lungs every 4 (four) hours as needed for wheezing or shortness of breath. 1 each 0 More than a month   predniSONE (DELTASONE) 10 MG tablet Take 5 tablets (50 mg total) by mouth daily. 15 tablet 0 More than a month    Review of Systems  Constitutional: Negative.  Negative for fatigue and fever.  HENT: Negative.    Respiratory: Negative.  Negative for shortness of breath.   Cardiovascular: Negative.  Negative for chest pain.  Gastrointestinal:  Positive for abdominal pain. Negative for constipation, diarrhea, nausea and vomiting.  Genitourinary: Negative.  Negative for dysuria, vaginal bleeding and vaginal discharge.  Neurological:  Positive for headaches. Negative for dizziness.   Physical Exam   Blood pressure 120/65, pulse 92, temperature (!) 101.6 F (38.7 C), resp. rate 18, height 5\' 4"  (1.626 m), weight 74.8 kg, last menstrual period 11/25/2021, SpO2 99 %, not currently breastfeeding.  Patient Vitals for the past 24 hrs:  BP  Temp Pulse Resp SpO2 Height Weight  03/28/22 1919 120/65 (!) 101.6 F (38.7 C) 92 18 99 % 5\' 4"  (1.626 m) 74.8 kg    Physical Exam Vitals and nursing note reviewed.  Constitutional:      General: She is not in acute distress.    Appearance: She is well-developed.  HENT:     Head: Normocephalic.  Eyes:     General: No visual field deficit.    Pupils: Pupils are equal, round, and reactive to light.  Cardiovascular:     Rate and Rhythm: Normal rate and regular rhythm.     Heart sounds: Normal heart sounds.  Pulmonary:     Effort: Pulmonary effort is normal. No respiratory distress.     Breath sounds: Normal breath sounds.  Abdominal:     General: Bowel sounds are normal. There is no distension.     Palpations: Abdomen is soft.     Tenderness: There is no abdominal tenderness.  Skin:    General: Skin is warm and dry.   Neurological:     Mental Status: She is alert and oriented to person, place, and time.     Cranial Nerves: No cranial nerve deficit.     Motor: No weakness.     Coordination: Coordination normal.     Gait: Gait normal.  Psychiatric:        Mood and Affect: Mood normal.        Behavior: Behavior normal.        Thought Content: Thought content normal.        Judgment: Judgment normal.     FHT: 162 bpm   MAU Course  Procedures   MDM Labs ordered and reviewed.   UA COVID/Flu PCR Excedrin Tension PO Reglan PO  MFM OB Limited  Care turned over to [facility]. Saisha Hogue CNM at [address], Rolm Bookbinder 03/28/22  Results for orders placed or performed during the hospital encounter of 03/28/22 (from the past 24 hour(s))  Urinalysis, Routine w reflex microscopic Urine, Clean Catch     Status: Abnormal   Collection Time: 03/28/22  6:57 PM  Result Value Ref Range   Color, Urine YELLOW YELLOW   APPearance HAZY (A) CLEAR   Specific Gravity, Urine 1.014 1.005 - 1.030   pH 8.0 5.0 - 8.0   Glucose, UA NEGATIVE NEGATIVE mg/dL   Hgb urine dipstick NEGATIVE NEGATIVE   Bilirubin Urine NEGATIVE NEGATIVE   Ketones, ur NEGATIVE NEGATIVE mg/dL   Protein, ur NEGATIVE NEGATIVE mg/dL   Nitrite NEGATIVE NEGATIVE   Leukocytes,Ua NEGATIVE NEGATIVE  Resp Panel by RT-PCR (Flu A&B, Covid) Anterior Nasal Swab     Status: None   Collection Time: 03/28/22  8:00 PM   Specimen: Anterior Nasal Swab  Result Value Ref Range   SARS Coronavirus 2 by RT PCR NEGATIVE NEGATIVE   Influenza A by PCR NEGATIVE NEGATIVE   Influenza B by PCR NEGATIVE NEGATIVE    03/30/22: Size consistent with dates Posterior Placenta Cervix 3.1cm long Small subchorionic hemorrhage  Informed of lab results, likely this is a viral illness Headache not improved with Excedrin, so Ibuprofen given since that is what usually works for her Discussed judicious and rare use in pregnancy, only 2nd Trim  Headache entirely resolved with  ibuprofen  Assessment and Plan  A:  [place] IUP at [redacted]w[redacted]d       Viral illness       Migraine headache  P;  Discharge home  Supportive care        Tylenol for fever and aches        Push fluids         Followup with prenatal care Encouraged to return if she develops worsening of symptoms, increase in pain, fever, or other concerning symptoms.

## 2022-03-28 NOTE — Discharge Instructions (Signed)
[facility] Providers   [facility]             [address], [place], [place] [address] [phone]  [facility]                                                             [address], [address], [place], [place], [address] [phone]  [facility]                                    [address], [address], [place], [place], [address] [phone]  [facility] [place] [address], [address], [place], [place], [address] [phone]  [facility]                                 [address], [place], [place], [address] [phone]  [facility]                                    [address], [place], [place], [address] [phone]  [facility] [address], [address], [place], [place], [address]                              [facility] [address], [place], , [address] [phone]  [facility]         Phone: [phone]  [facility]      Phone: [phone]   [facility]      Phone: [phone]  [facility]         Phone: [phone]   [facility]    Phone: [phone]  [facility]      Phone: [phone]  [facility]     Phone: [phone]  [facility]        Phone: [phone]  [facility] (Sheronette Cousins) [phone]  [facility]      Phone: [phone]   

## 2022-03-28 NOTE — MAU Note (Signed)
..  Jacqueline [name] is a 21 y.o. at Unknown here in MAU reporting: migraine that began 4 days ago, took ibuprofen yesterday but it did not help. Has had lower abdominal cramping that began a week ago, it comes and goes like period cramps.  Pt reports she feels cold.  Denies vaginal bleeding or leaking off fluid. LMP: end of June Pain score: 10/10 Vitals:   03/28/22 1919  BP: 120/65  Pulse: 92  Resp: 18  Temp: (!) 101.6 F (38.7 C)  SpO2: 99%     FHT:162 Lab orders placed from triage:  UA

## 2022-03-29 DIAGNOSIS — Z3A17 17 weeks gestation of pregnancy: Secondary | ICD-10-CM | POA: Diagnosis not present

## 2022-03-29 DIAGNOSIS — G43809 Other migraine, not intractable, without status migrainosus: Secondary | ICD-10-CM

## 2022-03-29 DIAGNOSIS — R52 Pain, unspecified: Secondary | ICD-10-CM

## 2022-03-29 DIAGNOSIS — B349 Viral infection, unspecified: Secondary | ICD-10-CM

## 2022-04-10 NOTE — Telephone Encounter (Signed)
This encounter was created in error - please disregard.

## 2022-04-16 ENCOUNTER — Encounter: Payer: BLUE CROSS/BLUE SHIELD | Admitting: Obstetrics and Gynecology

## 2022-04-16 ENCOUNTER — Ambulatory Visit (INDEPENDENT_AMBULATORY_CARE_PROVIDER_SITE_OTHER): Payer: BLUE CROSS/BLUE SHIELD | Admitting: Certified Nurse Midwife

## 2022-04-16 ENCOUNTER — Other Ambulatory Visit (HOSPITAL_COMMUNITY)
Admission: RE | Admit: 2022-04-16 | Discharge: 2022-04-16 | Disposition: A | Discharge status: Home / Self Care | Payer: BLUE CROSS/BLUE SHIELD | Source: Ambulatory Visit | Attending: Obstetrics and Gynecology | Admitting: Obstetrics and Gynecology

## 2022-04-16 ENCOUNTER — Encounter: Payer: Self-pay | Admitting: Certified Nurse Midwife

## 2022-04-16 VITALS — BP 114/66 | HR 68 | Wt 163.2 lb

## 2022-04-16 DIAGNOSIS — Z3482 Encounter for supervision of other normal pregnancy, second trimester: Secondary | ICD-10-CM

## 2022-04-16 DIAGNOSIS — O26892 Other specified pregnancy related conditions, second trimester: Secondary | ICD-10-CM

## 2022-04-16 DIAGNOSIS — Z3A20 20 weeks gestation of pregnancy: Secondary | ICD-10-CM

## 2022-04-16 DIAGNOSIS — O0932 Supervision of pregnancy with insufficient antenatal care, second trimester: Secondary | ICD-10-CM

## 2022-04-16 DIAGNOSIS — Z3480 Encounter for supervision of other normal pregnancy, unspecified trimester: Secondary | ICD-10-CM | POA: Diagnosis present

## 2022-04-16 DIAGNOSIS — O0930 Supervision of pregnancy with insufficient antenatal care, unspecified trimester: Secondary | ICD-10-CM

## 2022-04-16 DIAGNOSIS — R519 Headache, unspecified: Secondary | ICD-10-CM

## 2022-04-16 NOTE — Progress Notes (Signed)
Pt presents for new OB visit. Pt reports having frequent migraines during her pregnancy. Pt is late to care due to moving and unaware of pregnancy. EDD 09/01/22. Pt is having some spotting. No other concerns at this time.

## 2022-04-17 LAB — CERVICOVAGINAL ANCILLARY ONLY
Chlamydia: NEGATIVE
Comment: NEGATIVE
Comment: NEGATIVE
Comment: NORMAL
Neisseria Gonorrhea: NEGATIVE
Trichomonas: NEGATIVE

## 2022-04-18 ENCOUNTER — Encounter: Payer: Self-pay | Admitting: Certified Nurse Midwife

## 2022-04-18 DIAGNOSIS — Z3480 Encounter for supervision of other normal pregnancy, unspecified trimester: Secondary | ICD-10-CM | POA: Insufficient documentation

## 2022-04-18 LAB — AFP, SERUM, OPEN SPINA BIFIDA
AFP MoM: 0.59
AFP Value: 36.4 ng/mL
Gest. Age on Collection Date: 20.2 weeks
Maternal Age At EDD: 21.9 yr
OSBR Risk 1 IN: 10000
Test Results:: NEGATIVE
Weight: 163 [lb_av]

## 2022-04-18 LAB — CBC/D/PLT+RPR+RH+ABO+RUBIGG...
Antibody Screen: NEGATIVE
Basophils Absolute: 0 10*3/uL (ref 0.0–0.2)
Basos: 1 %
EOS (ABSOLUTE): 0.2 10*3/uL (ref 0.0–0.4)
Eos: 3 %
HCV Ab: NONREACTIVE
HIV Screen 4th Generation wRfx: NONREACTIVE
Hematocrit: 35.1 % (ref 34.0–46.6)
Hemoglobin: 11.5 g/dL (ref 11.1–15.9)
Hepatitis B Surface Ag: NEGATIVE
Immature Grans (Abs): 0.1 10*3/uL (ref 0.0–0.1)
Immature Granulocytes: 1 %
Lymphocytes Absolute: 1.8 10*3/uL (ref 0.7–3.1)
Lymphs: 21 %
MCH: 29.4 pg (ref 26.6–33.0)
MCHC: 32.8 g/dL (ref 31.5–35.7)
MCV: 90 fL (ref 79–97)
Monocytes Absolute: 0.4 10*3/uL (ref 0.1–0.9)
Monocytes: 5 %
Neutrophils Absolute: 6 10*3/uL (ref 1.4–7.0)
Neutrophils: 69 %
Platelets: 348 10*3/uL (ref 150–450)
RBC: 3.91 x10E6/uL (ref 3.77–5.28)
RDW: 12.2 % (ref 11.7–15.4)
RPR Ser Ql: NONREACTIVE
Rh Factor: POSITIVE
Rubella Antibodies, IGG: 2.55 index (ref >0.99)
WBC: 8.6 10*3/uL (ref 3.4–10.8)

## 2022-04-18 LAB — HCV INTERPRETATION

## 2022-04-18 LAB — URINE CULTURE, OB REFLEX

## 2022-04-18 LAB — CULTURE, OB URINE

## 2022-04-19 MED ORDER — ASPIRIN 81 MG PO CHEW: 81.0000 mg | CHEWABLE_TABLET | Freq: Every day | ORAL | Status: DC

## 2022-04-19 NOTE — Progress Notes (Signed)
History:   [place] Feimster is a 21 y.o. [phone] at [redacted]w[redacted]d by LMP being seen today for her first obstetrical visit.  Her obstetrical history is significant for PreE with first pregnancy with Di/Di twins and a PPH requiring 2 units of pRBCs in PP. Patient does intend to breast feed. Pregnancy history fully reviewed.  Patient reports no complaints.      HISTORY: OB History  Gravida Para Term Preterm AB Living  3 2 1 1  0 3  SAB IAB Ectopic Multiple Live Births  0 0 0 1 3    # Outcome Date GA Lbr Len/2nd Weight Sex Delivery Anes PTL Lv  3 Current           2 Term 04/27/20 [redacted]w[redacted]d 02:59 / 00:15 7 lb 7.6 oz (3.39 kg) M Vag-Spont EPI  LIV     Birth Comments: WDL     Name: Solorzano,BOY [redacted]w[redacted]d     Apgar1: 9  Apgar5: 9  1A Preterm 10/27/18 [redacted]w[redacted]d 06:39 / 00:26 4 lb 13.1 oz (2.185 kg) F Vag-Spont EPI  LIV     Name: Kobs,GIRLA [redacted]w[redacted]d     Apgar1: 8  Apgar5: 9  1B Preterm 10/27/18 [redacted]w[redacted]d 06:39 / 00:32 5 lb 10.5 oz (2.565 kg) F Vag-Spont EPI  LIV     Name: Goodness,GIRLB [redacted]w[redacted]d     Apgar1: 8  Apgar5: 9    Last pap smear was done 10/06/2019 and was normal  Past Medical History:  Diagnosis Date   Asthma    Migraines    Pregnancy induced hypertension    Supervision of other normal pregnancy, antepartum 09/29/2019          Nursing Staff  Provider  Office Location    [facility]  Dating    U/S  Language    English  Anatomy 10/01/2019    f/u in 4 weeks to complete anatomy   Flu Vaccine   Declined   Genetic Screen   NIPS: low risk female  AFP: neg       TDaP vaccine    declined   Hgb A1C or   GTT  Early   Third trimester nl 2 hour  Rhogam    n/a     LAB RESULTS         Blood Type  --/--/A POS (08/03 1937)   Feeding Plan  Breas   Past Surgical History:  Procedure Laterality Date   WISDOM TOOTH EXTRACTION     Family History  Problem Relation Age of Onset   Hypertension Mother    Asthma Brother    Hypertension Brother    Diabetes Brother    Hypertension Maternal Aunt    Diabetes Maternal Aunt    Autism  spectrum disorder Maternal Aunt    Hypertension Maternal Grandmother    Aneurysm Maternal Uncle    Social History   Tobacco Use   Smoking status: Never   Smokeless tobacco: Never  Vaping Use   Vaping Use: Former  Substance Use Topics   Alcohol use: Never    Alcohol/week: 0.0 standard drinks of alcohol   Drug use: Not Currently    Types: Marijuana    Comment: NOT SMOKED DURING PREG   Allergies  Allergen Reactions   Feraheme [Ferumoxytol] Other (See Comments)    Nausea, back pain, dizziness   Tomato Other (See Comments)    Makes asthma worse   Current Outpatient Medications on File Prior to Visit  Medication Sig Dispense Refill   albuterol (VENTOLIN HFA) 108 (  90 Base) MCG/ACT inhaler Inhale 2 puffs into the lungs every 4 (four) hours as needed for wheezing or shortness of breath. 1 each 0   predniSONE (DELTASONE) 10 MG tablet Take 5 tablets (50 mg total) by mouth daily. 15 tablet 0   [DISCONTINUED] labetalol (NORMODYNE) 300 MG tablet Take 1 tablet (300 mg total) by mouth 2 (two) times daily. (Patient not taking: Reported on 08/12/2019) 60 tablet 1   No current facility-administered medications on file prior to visit.    Review of Systems Pertinent items noted in HPI and remainder of comprehensive ROS otherwise negative.  Indications for ASA therapy (per UpToDate) One of the following: Previous pregnancy with preeclampsia, especially early onset and with an adverse outcome Yes Multifetal gestation Yes Chronic hypertension No Type 1 or 2 diabetes mellitus No Chronic kidney disease No Autoimmune disease (antiphospholipid syndrome, systemic lupus erythematosus) No Two or more of the following: Nulliparity No Obesity (body mass index >30 kg/m2) No Family history of preeclampsia in mother or sister No Age ?35 years No Sociodemographic characteristics (African American race, low socioeconomic level) Yes Personal risk factors (eg, previous pregnancy with low birth weight or  small for gestational age infant, previous adverse pregnancy outcome [eg, stillbirth], interval >10 years between pregnancies) No  Indications for early GDM screening (FBS, A1C, Random CBG, GTT) First-degree relative with diabetes No BMI >30kg/m2 No Age > 35 No Previous birth of an infant weighing ?4000 g No Gestational diabetes mellitus in a previous pregnancy No Glycated hemoglobin ?5.7 percent (39 mmol/mol), impaired glucose tolerance, or impaired fasting glucose on previous testing No High-risk race/ethnicity (eg, African American, Latino, Native American, Asian American, Pacific Islander) Yes Previous stillbirth of unknown cause No Maternal birthweight > 9 lbs No History of cardiovascular disease No Hypertension or on therapy for hypertension No High-density lipoprotein cholesterol level <35 mg/dL (6.21 mmol/L) and/or a triglyceride level >250 mg/dL (3.08 mmol/L) No Polycystic ovary syndrome No Physical inactivity No Other clinical condition associated with insulin resistance (eg, severe obesity, acanthosis nigricans) No Current use of glucocorticoids No   Physical Exam:   Vitals:   04/16/22 1548 04/16/22 1601  BP: 127/72 114/66  Pulse: 76 68  Weight: 163 lb 3.2 oz (74 kg) 163 lb 3.2 oz (74 kg)   Fetal Heart Rate (bpm): 150   General: well-developed, well-nourished female in no acute distress  Skin: normal coloration and turgor, no rashes  Neurologic: oriented, normal, negative, normal mood  Extremities: normal strength, tone, and muscle mass, ROM of all joints is normal  HEENT PERRLA, extraocular movement intact and sclera clear, anicteric  Neck supple and no masses  Cardiovascular: regular rate and rhythm  Respiratory:  no respiratory distress, normal breath sounds  Abdomen: soft, non-tender; bowel sounds normal; no masses,  no organomegaly    Assessment:    Pregnancy: [id number] Patient Active Problem List   Diagnosis Date Noted   Supervision of other normal  pregnancy, antepartum 04/18/2022   Birth control counseling 09/07/2020   Indication for care in labor or delivery 04/27/2020   SVD (spontaneous vaginal delivery) 04/27/2020   Pyelonephritis affecting pregnancy in second trimester 01/04/2020   Nausea and vomiting during pregnancy prior to [redacted] weeks gestation 11/10/2019   Hx of preeclampsia, prior pregnancy, currently pregnant 10/06/2019   Hx of postpartum hemorrhage, currently pregnant 10/06/2019   Short interval between pregnancies complicating pregnancy, antepartum 10/06/2019   Chlamydia trachomatis infection during pregnancy in first trimester 10/06/2019   Current nicotine use 10/06/2019  Plan:    1. Supervision of other normal pregnancy, antepartum - Patient states she is feeling frequent flutters  - CBC/D/Plt+RPR+Rh+ABO+RubIgG... - Cervicovaginal ancillary only - Culture, OB Urine - PANORAMA PRENATAL TEST FULL PANEL - HORIZON CUSTOM - AFP, Serum, Open Spina Bifida - [place] MFM OB COMP + 14 WK; Future  2. Late prenatal care - [place] MFM OB COMP + 14 WK; Future - Due to previous PreE dx initiate daily BB ASA per Uptodate recommendation   3. [redacted] weeks gestation of pregnancy - [place] MFM OB COMP + 14 WK; Future - Patient desires a BTL. Next appt placed on MD schedule to consult for tubal ligation.   4. Pregnancy headache in second trimester - Patient taking ibuprofen in pregnancy.  - Discouraged use of ibuprofen. Recommended PO Tylenol for headaches if occurs.  - Patient verbalized understanding    Initial labs drawn. Continue prenatal vitamins. Problem list reviewed and updated. Genetic Screening discussed,   , Panorama, and Horizon: requested. Ultrasound discussed; fetal anatomic survey: scheduled. Anticipatory guidance about prenatal visits given including labs, ultrasounds, and testing. Weight gain recommendations per IOM guidelines reviewed: underweight/BMI 18.5 or less > 28 - 40 lbs; normal weight/BMI 18.5 - 24.9 > 25 - 35  lbs; overweight/BMI 25 - 29.9 > 15 - 25 lbs; obese/BMI  30 or more > 11 - 20 lbs. Discussed usage of the Babyscripts app for more information about pregnancy, and to track blood pressures. Also discussed usage of virtual visits as additional source of managing and completing prenatal visits.  Patient was encouraged to use MyChart to review results, send requests, and have questions addressed.   The nature of [place] [facility] Practice with multiple MDs and Advanced Practice Providers was explained to patient; also emphasized that residents, students are part of our team. Routine obstetric precautions reviewed. Encouraged to seek out care at our office or emergency room [facility] MAU preferred) for urgent and/or emergent concerns.  Return in about 4 weeks (around 05/14/2022) for LOB.     Eliese Kerwood Danella Deis) Suzie Portela, MSN, CNM  [facility]  04/19/22 2:35 PM

## 2022-04-22 LAB — PANORAMA PRENATAL TEST FULL PANEL:PANORAMA TEST PLUS 5 ADDITIONAL MICRODELETIONS: FETAL FRACTION: 12.3

## 2022-04-23 LAB — HORIZON CUSTOM

## 2022-05-10 ENCOUNTER — Encounter: Payer: Self-pay | Admitting: Obstetrics and Gynecology

## 2022-05-10 ENCOUNTER — Ambulatory Visit (INDEPENDENT_AMBULATORY_CARE_PROVIDER_SITE_OTHER): Payer: BLUE CROSS/BLUE SHIELD | Admitting: Obstetrics and Gynecology

## 2022-05-10 VITALS — BP 106/61 | HR 96 | Wt 165.0 lb

## 2022-05-10 DIAGNOSIS — Z3A23 23 weeks gestation of pregnancy: Secondary | ICD-10-CM

## 2022-05-10 DIAGNOSIS — Z3480 Encounter for supervision of other normal pregnancy, unspecified trimester: Secondary | ICD-10-CM

## 2022-05-10 DIAGNOSIS — Z3483 Encounter for supervision of other normal pregnancy, third trimester: Secondary | ICD-10-CM

## 2022-05-10 DIAGNOSIS — O09293 Supervision of pregnancy with other poor reproductive or obstetric history, third trimester: Secondary | ICD-10-CM

## 2022-05-10 DIAGNOSIS — O09299 Supervision of pregnancy with other poor reproductive or obstetric history, unspecified trimester: Secondary | ICD-10-CM

## 2022-05-10 DIAGNOSIS — Z3009 Encounter for other general counseling and advice on contraception: Secondary | ICD-10-CM

## 2022-05-10 DIAGNOSIS — O219 Vomiting of pregnancy, unspecified: Secondary | ICD-10-CM | POA: Insufficient documentation

## 2022-05-10 MED ORDER — PREPLUS 27-1 MG PO TABS: 1.0000 | ORAL_TABLET | Freq: Every day | ORAL | 13 refills | Status: DC

## 2022-05-10 MED ORDER — ASPIRIN 81 MG PO TBEC: 81.0000 mg | DELAYED_RELEASE_TABLET | Freq: Every day | ORAL | 2 refills | Status: AC

## 2022-05-10 MED ORDER — ONDANSETRON HCL 4 MG PO TABS: 4.0000 mg | ORAL_TABLET | Freq: Three times a day (TID) | ORAL | 2 refills | Status: DC | PRN

## 2022-05-10 NOTE — Progress Notes (Signed)
Pt states she is having lower pelvic pain and sharp upper abd pain.   Pt also complains of dizziness.   Pt doesn't have much appetitie, will have some vomiting when she does eat - does not have nausea Rx. Pt is not drinking enough- advised to increase intake.  Pt states she is also craving/eating cornstarch.

## 2022-05-10 NOTE — Progress Notes (Signed)
Subjective:  [place] Tuft is a 21 y.o. [phone] at 82w5dbeing seen today for ongoing prenatal care.  She is currently monitored for the following issues for this high-risk pregnancy and has Hx of preeclampsia, prior pregnancy, currently pregnant; Hx of postpartum hemorrhage, currently pregnant; Short interval between pregnancies complicating pregnancy, antepartum; Current nicotine use; Birth control counseling; Supervision of other normal pregnancy, antepartum; Nausea and vomiting in pregnancy; and Unwanted fertility on their problem list.  Patient reports nausea.  Contractions: Not present. Vag. Bleeding: None.  Movement: Present. Denies leaking of fluid.   The following portions of the patient's history were reviewed and updated as appropriate: allergies, current medications, past family history, past medical history, past social history, past surgical history and problem list. Problem list updated.  Objective:   Vitals:   05/10/22 0917  BP: 106/61  Pulse: 96  Weight: 165 lb (74.8 kg)    Fetal Status: Fetal Heart Rate (bpm): 140   Movement: Present     General:  Alert, oriented and cooperative. Patient is in no acute distress.  Skin: Skin is warm and dry. No rash noted.   Cardiovascular: Normal heart rate noted  Respiratory: Normal respiratory effort, no problems with respiration noted  Abdomen: Soft, gravid, appropriate for gestational age. Pain/Pressure: Present     Pelvic:  Cervical exam deferred        Extremities: Normal range of motion.     Mental Status: Normal mood and affect. Normal behavior. Normal judgment and thought content.   Urinalysis:      Assessment and Plan:  Pregnancy: G3P1103 at 212w5d1. Supervision of other normal pregnancy, antepartum Stable Undecided about the flu vaccine Glucola next visit  2. Hx of preeclampsia, prior pregnancy, currently pregnant No S/Sx at present - aspirin EC 81 MG tablet; Take 1 tablet (81 mg total) by mouth daily. Take  after 12 weeks for prevention of preeclampsia later in pregnancy  Dispense: 300 tablet; Refill: 2 - Comp Met (CMET) - TSH - Protein / creatinine ratio, urine  3. Hx of postpartum hemorrhage, currently pregnant Management PP  4. Nausea and vomiting in pregnancy Diet reviewed with pt - ondansetron (ZOFRAN) 4 MG tablet; Take 1 tablet (4 mg total) by mouth every 8 (eight) hours as needed for nausea or vomiting.  Dispense: 20 tablet; Refill: 2  5. Unwanted fertility Discussed with pt  Also discussed IUD   Preterm labor symptoms and general obstetric precautions including but not limited to vaginal bleeding, contractions, leaking of fluid and fetal movement were reviewed in detail with the patient. Please refer to After Visit Summary for other counseling recommendations.  Return in about 4 weeks (around 06/07/2022) for OB visit, face to face, MD only, fasting for Glucola.   ErChancy MilroyMD

## 2022-05-10 NOTE — Patient Instructions (Signed)

## 2022-05-15 LAB — COMPREHENSIVE METABOLIC PANEL
ALT: 6 IU/L (ref 0–32)
AST: 9 IU/L (ref 0–40)
Albumin/Globulin Ratio: 1.4 (ref 1.2–2.2)
Albumin: 4 g/dL (ref 4.0–5.0)
Alkaline Phosphatase: 57 IU/L (ref 44–121)
BUN/Creatinine Ratio: 12 (ref 9–23)
BUN: 8 mg/dL (ref 6–20)
Bilirubin Total: 0.4 mg/dL (ref 0.0–1.2)
CO2: 18 mmol/L — ABNORMAL LOW (ref 20–29)
Calcium: 8.9 mg/dL (ref 8.7–10.2)
Chloride: 102 mmol/L (ref 96–106)
Creatinine, Ser: 0.68 mg/dL (ref 0.57–1.00)
Globulin, Total: 2.8 g/dL (ref 1.5–4.5)
Glucose: 81 mg/dL (ref 70–99)
Potassium: 4.1 mmol/L (ref 3.5–5.2)
Sodium: 136 mmol/L (ref 134–144)
Total Protein: 6.8 g/dL (ref 6.0–8.5)
eGFR: 127 mL/min/{1.73_m2} (ref >59)

## 2022-05-15 LAB — TSH: TSH: 1.13 u[IU]/mL (ref 0.450–4.500)

## 2022-05-17 ENCOUNTER — Ambulatory Visit: Payer: BLUE CROSS/BLUE SHIELD

## 2022-05-21 ENCOUNTER — Ambulatory Visit: Payer: BLUE CROSS/BLUE SHIELD | Attending: Certified Nurse Midwife

## 2022-05-21 ENCOUNTER — Other Ambulatory Visit: Payer: Self-pay | Admitting: *Deleted

## 2022-05-21 ENCOUNTER — Ambulatory Visit: Payer: BLUE CROSS/BLUE SHIELD | Admitting: *Deleted

## 2022-05-21 ENCOUNTER — Other Ambulatory Visit: Payer: Self-pay | Admitting: Certified Nurse Midwife

## 2022-05-21 VITALS — BP 110/61 | HR 86

## 2022-05-21 DIAGNOSIS — O0930 Supervision of pregnancy with insufficient antenatal care, unspecified trimester: Secondary | ICD-10-CM

## 2022-05-21 DIAGNOSIS — Z3480 Encounter for supervision of other normal pregnancy, unspecified trimester: Secondary | ICD-10-CM | POA: Insufficient documentation

## 2022-05-21 DIAGNOSIS — Z3A22 22 weeks gestation of pregnancy: Secondary | ICD-10-CM | POA: Diagnosis not present

## 2022-05-21 DIAGNOSIS — O09292 Supervision of pregnancy with other poor reproductive or obstetric history, second trimester: Secondary | ICD-10-CM

## 2022-05-21 DIAGNOSIS — O09892 Supervision of other high risk pregnancies, second trimester: Secondary | ICD-10-CM

## 2022-05-21 DIAGNOSIS — O26842 Uterine size-date discrepancy, second trimester: Secondary | ICD-10-CM

## 2022-05-21 DIAGNOSIS — Z3A20 20 weeks gestation of pregnancy: Secondary | ICD-10-CM | POA: Diagnosis not present

## 2022-05-21 DIAGNOSIS — O09299 Supervision of pregnancy with other poor reproductive or obstetric history, unspecified trimester: Secondary | ICD-10-CM

## 2022-05-21 DIAGNOSIS — O0932 Supervision of pregnancy with insufficient antenatal care, second trimester: Secondary | ICD-10-CM | POA: Insufficient documentation

## 2022-05-21 DIAGNOSIS — Z363 Encounter for antenatal screening for malformations: Secondary | ICD-10-CM | POA: Diagnosis not present

## 2022-05-21 DIAGNOSIS — R519 Headache, unspecified: Secondary | ICD-10-CM

## 2022-05-21 DIAGNOSIS — O1492 Unspecified pre-eclampsia, second trimester: Secondary | ICD-10-CM | POA: Insufficient documentation

## 2022-05-31 LAB — PROTEIN / CREATININE RATIO, URINE: Creatinine, Urine: 183.5 mg/dL

## 2022-06-03 NOTE — L&D Delivery Note (Addendum)
Delivery Note [place] Jacqueline [name] is a 22 y.o. [phone] at [redacted]w[redacted]d admitted for PPROM.   GBS Status:  NEGATIVE/-- (03/17 1828) treated for preterm labor Maximum Maternal Temperature: 98.55F  Labor course: Initial SVE: 1/thick. She initially opted for expectant management. Due to slow progression, she was augmentated with: Pitocin. She then progressed to complete after getting epidural for pain control. ROM: 27h 37m with clear fluid  Birth: At 1732 a viable female was delivered over 1 contraction via spontaneous vaginal delivery (Presentation: direct OA to ROT ;  ). Nuchal cord present: Yes one loop. Reduced by somersault after delivery of body.  Shoulders and body delivered in usual fashion. Infant placed directly on mom's abdomen for bonding/skin-to-skin, baby dried and stimulated. Cord clamped x 2 after several minute and cut by support person (aunt).  Cord blood collected.  The placenta separated spontaneously and delivered via gentle cord traction.  Pitocin infused rapidly IV per protocol.  Fundus firm with massage.  Placenta inspected and appears to be intact with a 3 VC.  Placenta/Cord with the following complications: none .  Sponge and instrument count were correct x2.  Intrapartum complications:  None Anesthesia:  epidural Episiotomy: none Lacerations:  none Suture Repair:  N/A EBL (mL): 50cc   Infant: APGAR (1 MIN):  pending APGAR (5 MINS):  pending APGAR (10 MINS):    Infant weight: pending  Mom to postpartum.  Baby to Couplet care / Skin to Skin. Placenta to L&D  (initially planning to take home, later decided against it) Plans to Breastfeed Contraception: undecided, possibly IUD Circumcision: wants inpatient  Delivery was attended by Marcille Buffy, CNM. Jacqueline [name] "Jacqueline [name], M.D. PGY-2 Family Medicine Visiting Resident Faculty Practice 08/19/2022 5:47 PM   I was present and gloved with the student. I was available for immediate assistance. I agree with the note above.    Marcille Buffy DNP, CNM  08/19/22  5:54 PM

## 2022-06-05 ENCOUNTER — Encounter: Payer: Self-pay | Admitting: Obstetrics and Gynecology

## 2022-06-07 ENCOUNTER — Other Ambulatory Visit: Payer: BLUE CROSS/BLUE SHIELD

## 2022-06-07 ENCOUNTER — Ambulatory Visit (INDEPENDENT_AMBULATORY_CARE_PROVIDER_SITE_OTHER): Payer: BLUE CROSS/BLUE SHIELD | Admitting: Obstetrics and Gynecology

## 2022-06-07 VITALS — BP 119/72 | HR 99 | Wt 163.0 lb

## 2022-06-07 DIAGNOSIS — Z3009 Encounter for other general counseling and advice on contraception: Secondary | ICD-10-CM

## 2022-06-07 DIAGNOSIS — O09292 Supervision of pregnancy with other poor reproductive or obstetric history, second trimester: Secondary | ICD-10-CM

## 2022-06-07 DIAGNOSIS — O09892 Supervision of other high risk pregnancies, second trimester: Secondary | ICD-10-CM

## 2022-06-07 DIAGNOSIS — Z3480 Encounter for supervision of other normal pregnancy, unspecified trimester: Secondary | ICD-10-CM

## 2022-06-07 DIAGNOSIS — O09899 Supervision of other high risk pregnancies, unspecified trimester: Secondary | ICD-10-CM

## 2022-06-07 DIAGNOSIS — O09299 Supervision of pregnancy with other poor reproductive or obstetric history, unspecified trimester: Secondary | ICD-10-CM

## 2022-06-07 DIAGNOSIS — Z3A25 25 weeks gestation of pregnancy: Secondary | ICD-10-CM

## 2022-06-07 DIAGNOSIS — Z3482 Encounter for supervision of other normal pregnancy, second trimester: Secondary | ICD-10-CM

## 2022-06-07 NOTE — Progress Notes (Signed)
   PRENATAL VISIT NOTE  Subjective:  Jacqueline [name] is a 22 y.o. [phone] at [redacted]w[redacted]d being seen today for ongoing prenatal care.  She is currently monitored for the following issues for this low-risk pregnancy and has Hx of preeclampsia, prior pregnancy, currently pregnant; Hx of postpartum hemorrhage, currently pregnant; Short interval between pregnancies complicating pregnancy, antepartum; Current nicotine use; Birth control counseling; Supervision of other normal pregnancy, antepartum; Nausea and vomiting in pregnancy; and Unwanted fertility on their problem list.  Patient doing well with no acute concerns today. She reports no complaints.  Contractions: Not present.  .  Movement: Present. Denies leaking of fluid.   The following portions of the patient's history were reviewed and updated as appropriate: allergies, current medications, past family history, past medical history, past social history, past surgical history and problem list. Problem list updated.  Objective:   Vitals:   06/07/22 0921  BP: 119/72  Pulse: 99  Weight: 163 lb (73.9 kg)    Fetal Status: Fetal Heart Rate (bpm): 140 Fundal Height: 26 cm Movement: Present     General:  Alert, oriented and cooperative. Patient is in no acute distress.  Skin: Skin is warm and dry. No rash noted.   Cardiovascular: Normal heart rate noted  Respiratory: Normal respiratory effort, no problems with respiration noted  Abdomen: Soft, gravid, appropriate for gestational age.  Pain/Pressure: Absent     Pelvic: Cervical exam deferred        Extremities: Normal range of motion.     Mental Status:  Normal mood and affect. Normal behavior. Normal judgment and thought content.   Assessment and Plan:  Pregnancy: G3P1103 at [redacted]w[redacted]d  1. [redacted] weeks gestation of pregnancy   2. Supervision of other normal pregnancy, antepartum Continue routine prenatal care  - Glucose Tolerance, 2 Hours w/1 Hour - RPR - CBC - HIV antibody (with reflex)  3.  Short interval between pregnancies complicating pregnancy, antepartum   4. Hx of preeclampsia, prior pregnancy, currently pregnant BP WNL, compliance with baby ASA noted No s/sx of preeclampsia  5. Hx of postpartum hemorrhage, currently pregnant Consider TXA at time of delivery  6. Unwanted fertility Reviewed chart and discussed options again, pt really wants BTL due to number of children, but understands risk of regret and will try IUD first  Preterm labor symptoms and general obstetric precautions including but not limited to vaginal bleeding, contractions, leaking of fluid and fetal movement were reviewed in detail with the patient.  Please refer to After Visit Summary for other counseling recommendations.   Return in about 3 weeks (around 06/28/2022) for ROB, in person.   Lynnda Shields, MD Faculty Attending [facility]

## 2022-06-08 LAB — CBC
Hematocrit: 35 % (ref 34.0–46.6)
Hemoglobin: 11.4 g/dL (ref 11.1–15.9)
MCH: 29.5 pg (ref 26.6–33.0)
MCHC: 32.6 g/dL (ref 31.5–35.7)
MCV: 90 fL (ref 79–97)
Platelets: 366 10*3/uL (ref 150–450)
RBC: 3.87 x10E6/uL (ref 3.77–5.28)
RDW: 13.2 % (ref 11.7–15.4)
WBC: 7.6 10*3/uL (ref 3.4–10.8)

## 2022-06-08 LAB — GLUCOSE TOLERANCE, 2 HOURS W/ 1HR
Glucose, 1 hour: 76 mg/dL (ref 70–179)
Glucose, 2 hour: 57 mg/dL — ABNORMAL LOW (ref 70–152)
Glucose, Fasting: 70 mg/dL (ref 70–91)

## 2022-06-08 LAB — RPR: RPR Ser Ql: NONREACTIVE

## 2022-06-08 LAB — HIV ANTIBODY (ROUTINE TESTING W REFLEX): HIV Screen 4th Generation wRfx: NONREACTIVE

## 2022-06-18 ENCOUNTER — Ambulatory Visit: Payer: BLUE CROSS/BLUE SHIELD | Admitting: *Deleted

## 2022-06-18 ENCOUNTER — Ambulatory Visit: Payer: BLUE CROSS/BLUE SHIELD | Attending: Obstetrics and Gynecology

## 2022-06-18 ENCOUNTER — Encounter: Payer: Self-pay | Admitting: *Deleted

## 2022-06-18 ENCOUNTER — Other Ambulatory Visit: Payer: Self-pay | Admitting: *Deleted

## 2022-06-18 VITALS — BP 108/55 | HR 84

## 2022-06-18 DIAGNOSIS — O26843 Uterine size-date discrepancy, third trimester: Secondary | ICD-10-CM

## 2022-06-18 DIAGNOSIS — O321XX0 Maternal care for breech presentation, not applicable or unspecified: Secondary | ICD-10-CM | POA: Diagnosis not present

## 2022-06-18 DIAGNOSIS — Z3A26 26 weeks gestation of pregnancy: Secondary | ICD-10-CM | POA: Insufficient documentation

## 2022-06-18 DIAGNOSIS — O0932 Supervision of pregnancy with insufficient antenatal care, second trimester: Secondary | ICD-10-CM | POA: Diagnosis not present

## 2022-06-18 DIAGNOSIS — O0933 Supervision of pregnancy with insufficient antenatal care, third trimester: Secondary | ICD-10-CM

## 2022-06-18 DIAGNOSIS — O09292 Supervision of pregnancy with other poor reproductive or obstetric history, second trimester: Secondary | ICD-10-CM | POA: Diagnosis not present

## 2022-06-18 DIAGNOSIS — O09293 Supervision of pregnancy with other poor reproductive or obstetric history, third trimester: Secondary | ICD-10-CM

## 2022-06-18 DIAGNOSIS — O4592 Premature separation of placenta, unspecified, second trimester: Secondary | ICD-10-CM | POA: Diagnosis not present

## 2022-06-18 DIAGNOSIS — O26842 Uterine size-date discrepancy, second trimester: Secondary | ICD-10-CM

## 2022-06-18 DIAGNOSIS — Z3480 Encounter for supervision of other normal pregnancy, unspecified trimester: Secondary | ICD-10-CM | POA: Insufficient documentation

## 2022-06-18 DIAGNOSIS — O09299 Supervision of pregnancy with other poor reproductive or obstetric history, unspecified trimester: Secondary | ICD-10-CM | POA: Diagnosis present

## 2022-06-18 DIAGNOSIS — O1492 Unspecified pre-eclampsia, second trimester: Secondary | ICD-10-CM | POA: Insufficient documentation

## 2022-06-18 DIAGNOSIS — O09892 Supervision of other high risk pregnancies, second trimester: Secondary | ICD-10-CM | POA: Insufficient documentation

## 2022-06-18 DIAGNOSIS — O09893 Supervision of other high risk pregnancies, third trimester: Secondary | ICD-10-CM

## 2022-06-18 DIAGNOSIS — O09212 Supervision of pregnancy with history of pre-term labor, second trimester: Secondary | ICD-10-CM | POA: Diagnosis not present

## 2022-07-02 ENCOUNTER — Encounter: Payer: Self-pay | Admitting: Family Medicine

## 2022-07-02 ENCOUNTER — Ambulatory Visit (INDEPENDENT_AMBULATORY_CARE_PROVIDER_SITE_OTHER): Payer: BLUE CROSS/BLUE SHIELD | Admitting: Family Medicine

## 2022-07-02 VITALS — BP 103/63 | HR 84 | Wt 162.4 lb

## 2022-07-02 DIAGNOSIS — Z3A28 28 weeks gestation of pregnancy: Secondary | ICD-10-CM

## 2022-07-02 DIAGNOSIS — O09299 Supervision of pregnancy with other poor reproductive or obstetric history, unspecified trimester: Secondary | ICD-10-CM

## 2022-07-02 DIAGNOSIS — Z3480 Encounter for supervision of other normal pregnancy, unspecified trimester: Secondary | ICD-10-CM

## 2022-07-02 DIAGNOSIS — Z3483 Encounter for supervision of other normal pregnancy, third trimester: Secondary | ICD-10-CM

## 2022-07-02 DIAGNOSIS — O09899 Supervision of other high risk pregnancies, unspecified trimester: Secondary | ICD-10-CM

## 2022-07-02 DIAGNOSIS — O09893 Supervision of other high risk pregnancies, third trimester: Secondary | ICD-10-CM

## 2022-07-02 DIAGNOSIS — O09293 Supervision of pregnancy with other poor reproductive or obstetric history, third trimester: Secondary | ICD-10-CM

## 2022-07-02 DIAGNOSIS — R102 Pelvic and perineal pain unspecified side: Secondary | ICD-10-CM

## 2022-07-02 NOTE — Progress Notes (Signed)
   PRENATAL VISIT NOTE  Subjective:  Jacqueline [name] is a 22 y.o. [phone] at [redacted]w[redacted]d being seen today for ongoing prenatal care.  She is currently monitored for the following issues for this low-risk pregnancy and has Hx of preeclampsia, prior pregnancy, currently pregnant; Hx of postpartum hemorrhage, currently pregnant; Short interval between pregnancies complicating pregnancy, antepartum; Current nicotine use; Birth control counseling; Supervision of other normal pregnancy, antepartum; Nausea and vomiting in pregnancy; and Unwanted fertility on their problem list.  Patient reports occasional contractions.  Contractions: Not present. Vag. Bleeding: None.  Movement: Present. Denies leaking of fluid.   The following portions of the patient's history were reviewed and updated as appropriate: allergies, current medications, past family history, past medical history, past social history, past surgical history and problem list.   Objective:   Vitals:   07/02/22 1433  BP: 103/63  Pulse: 84  Weight: 162 lb 6.4 oz (73.7 kg)    Fetal Status: Fetal Heart Rate (bpm): 137   Movement: Present     General:  Alert, oriented and cooperative. Patient is in no acute distress.  Skin: Skin is warm and dry. No rash noted.   Cardiovascular: Normal heart rate noted  Respiratory: Normal respiratory effort, no problems with respiration noted  Abdomen: Soft, gravid, appropriate for gestational age.  Pain/Pressure: Present     Pelvic: Cervical exam deferred        Extremities: Normal range of motion.  Edema: None  Mental Status: Normal mood and affect. Normal behavior. Normal judgment and thought content.   Assessment and Plan:  Pregnancy: G3P1103 at [redacted]w[redacted]d 1. Supervision of other normal pregnancy, antepartum Continue routine prenatal care  2. Short interval between pregnancies complicating pregnancy, antepartum  3. Hx of preeclampsia, prior pregnancy, currently pregnant BPs within normal limits today.   Patient continues to take daily ASA  4. Hx of postpartum hemorrhage, currently pregnant  5. [redacted] weeks gestation of pregnancy  6. Pelvic pain Intermittent and occasional pelvic pain.  Reports that she is up to the fundus.  Is not experiencing at this time.  Continue to monitor.  Discussed use of over-the-counter medication such as Tylenol.  Preterm labor symptoms and general obstetric precautions including but not limited to vaginal bleeding, contractions, leaking of fluid and fetal movement were reviewed in detail with the patient. Please refer to After Visit Summary for other counseling recommendations.   No follow-ups on file.  Future Appointments  Date Time Provider [place]  08/13/2022 11:15 AM [facility] NURSE [facility]  08/13/2022 11:30 AM [facility] US3 [facility]    Concepcion Living, MD

## 2022-07-02 NOTE — Progress Notes (Signed)
Pt presents for ROB visit. Pt reports having an increase in pelvic pain and upper gastric pain. She reports having nausea and vomiting last night from the pain. No other concerns at this time.  PHQ & GAD negative.

## 2022-07-16 ENCOUNTER — Encounter: Payer: Self-pay | Admitting: Obstetrics

## 2022-07-16 ENCOUNTER — Encounter: Payer: BLUE CROSS/BLUE SHIELD | Admitting: Obstetrics

## 2022-07-16 ENCOUNTER — Ambulatory Visit (INDEPENDENT_AMBULATORY_CARE_PROVIDER_SITE_OTHER): Payer: BLUE CROSS/BLUE SHIELD | Admitting: Obstetrics

## 2022-07-16 VITALS — BP 116/61 | HR 80 | Wt 163.9 lb

## 2022-07-16 DIAGNOSIS — Z3480 Encounter for supervision of other normal pregnancy, unspecified trimester: Secondary | ICD-10-CM

## 2022-07-16 DIAGNOSIS — O09899 Supervision of other high risk pregnancies, unspecified trimester: Secondary | ICD-10-CM

## 2022-07-16 DIAGNOSIS — O0933 Supervision of pregnancy with insufficient antenatal care, third trimester: Secondary | ICD-10-CM

## 2022-07-16 DIAGNOSIS — Z3A30 30 weeks gestation of pregnancy: Secondary | ICD-10-CM

## 2022-07-16 DIAGNOSIS — O09293 Supervision of pregnancy with other poor reproductive or obstetric history, third trimester: Secondary | ICD-10-CM

## 2022-07-16 DIAGNOSIS — O0930 Supervision of pregnancy with insufficient antenatal care, unspecified trimester: Secondary | ICD-10-CM

## 2022-07-16 DIAGNOSIS — O09299 Supervision of pregnancy with other poor reproductive or obstetric history, unspecified trimester: Secondary | ICD-10-CM

## 2022-07-16 DIAGNOSIS — O09893 Supervision of other high risk pregnancies, third trimester: Secondary | ICD-10-CM

## 2022-07-16 NOTE — Progress Notes (Signed)
Pt presents for ROB reports increased pressure.

## 2022-07-16 NOTE — Progress Notes (Signed)
Subjective:  Jacqueline [name] is a 22 y.o. [phone] at 29w6dbeing seen today for ongoing prenatal care.  She is currently monitored for the following issues for this low-risk pregnancy and has Hx of preeclampsia, prior pregnancy, currently pregnant; Hx of postpartum hemorrhage, currently pregnant; Short interval between pregnancies complicating pregnancy, antepartum; Current nicotine use; Birth control counseling; Supervision of other normal pregnancy, antepartum; Nausea and vomiting in pregnancy; and Unwanted fertility on their problem list.  Patient reports  pelvic pressure .  Contractions: Not present. Vag. Bleeding: None.  Movement: Present. Denies leaking of fluid.   The following portions of the patient's history were reviewed and updated as appropriate: allergies, current medications, past family history, past medical history, past social history, past surgical history and problem list. Problem list updated.  Objective:   Vitals:   07/16/22 1116  BP: 116/61  Pulse: 80  Weight: 163 lb 14.4 oz (74.3 kg)    Fetal Status: Fetal Heart Rate (bpm): 132   Movement: Present     General:  Alert, oriented and cooperative. Patient is in no acute distress.  Skin: Skin is warm and dry. No rash noted.   Cardiovascular: Normal heart rate noted  Respiratory: Normal respiratory effort, no problems with respiration noted  Abdomen: Soft, gravid, appropriate for gestational age. Pain/Pressure: Present     Pelvic:  Cervical exam deferred        Extremities: Normal range of motion.  Edema: None  Mental Status: Normal mood and affect. Normal behavior. Normal judgment and thought content.   Urinalysis:      Assessment and Plan:  Pregnancy: G3P1103 at 370w6d1. Supervision of other normal pregnancy, antepartum  2. Late prenatal care  3. Short interval between pregnancies complicating pregnancy, antepartum  4. Hx of preeclampsia, prior pregnancy, currently pregnant - Baby ASA Rx  5. Hx of  postpartum hemorrhage, currently pregnant    There are no diagnoses linked to this encounter. Preterm labor symptoms and general obstetric precautions including but not limited to vaginal bleeding, contractions, leaking of fluid and fetal movement were reviewed in detail with the patient. Please refer to After Visit Summary for other counseling recommendations.   Return in about 2 weeks (around 07/30/2022) for ROB.   HaShelly BombardMD 07/16/22

## 2022-07-17 ENCOUNTER — Encounter: Payer: Self-pay | Admitting: Obstetrics

## 2022-07-30 ENCOUNTER — Encounter: Payer: BLUE CROSS/BLUE SHIELD | Admitting: Obstetrics

## 2022-07-31 ENCOUNTER — Inpatient Hospital Stay (HOSPITAL_COMMUNITY)
Admission: AD | Admit: 2022-07-31 | Discharge: 2022-07-31 | Disposition: A | Discharge status: Home / Self Care | Payer: BLUE CROSS/BLUE SHIELD | Attending: Obstetrics and Gynecology | Admitting: Obstetrics and Gynecology

## 2022-07-31 ENCOUNTER — Telehealth (INDEPENDENT_AMBULATORY_CARE_PROVIDER_SITE_OTHER): Payer: BLUE CROSS/BLUE SHIELD | Admitting: Obstetrics

## 2022-07-31 ENCOUNTER — Other Ambulatory Visit: Payer: Self-pay

## 2022-07-31 ENCOUNTER — Encounter (HOSPITAL_COMMUNITY): Payer: Self-pay | Admitting: Obstetrics and Gynecology

## 2022-07-31 ENCOUNTER — Encounter: Payer: Self-pay | Admitting: Obstetrics

## 2022-07-31 DIAGNOSIS — R102 Pelvic and perineal pain: Secondary | ICD-10-CM | POA: Diagnosis not present

## 2022-07-31 DIAGNOSIS — O09299 Supervision of pregnancy with other poor reproductive or obstetric history, unspecified trimester: Secondary | ICD-10-CM

## 2022-07-31 DIAGNOSIS — O26893 Other specified pregnancy related conditions, third trimester: Secondary | ICD-10-CM | POA: Diagnosis present

## 2022-07-31 DIAGNOSIS — Z3A33 33 weeks gestation of pregnancy: Secondary | ICD-10-CM

## 2022-07-31 DIAGNOSIS — Z3480 Encounter for supervision of other normal pregnancy, unspecified trimester: Secondary | ICD-10-CM

## 2022-07-31 DIAGNOSIS — O0930 Supervision of pregnancy with insufficient antenatal care, unspecified trimester: Secondary | ICD-10-CM

## 2022-07-31 DIAGNOSIS — O0933 Supervision of pregnancy with insufficient antenatal care, third trimester: Secondary | ICD-10-CM

## 2022-07-31 DIAGNOSIS — O09293 Supervision of pregnancy with other poor reproductive or obstetric history, third trimester: Secondary | ICD-10-CM

## 2022-07-31 DIAGNOSIS — Z3009 Encounter for other general counseling and advice on contraception: Secondary | ICD-10-CM

## 2022-07-31 DIAGNOSIS — O219 Vomiting of pregnancy, unspecified: Secondary | ICD-10-CM

## 2022-07-31 DIAGNOSIS — O09899 Supervision of other high risk pregnancies, unspecified trimester: Secondary | ICD-10-CM

## 2022-07-31 LAB — URINALYSIS, ROUTINE W REFLEX MICROSCOPIC
Bilirubin Urine: NEGATIVE
Glucose, UA: NEGATIVE mg/dL
Hgb urine dipstick: NEGATIVE
Ketones, ur: NEGATIVE mg/dL
Nitrite: NEGATIVE
Protein, ur: NEGATIVE mg/dL
Specific Gravity, Urine: 1.006 (ref 1.005–1.030)
pH: 7 (ref 5.0–8.0)

## 2022-07-31 LAB — WET PREP, GENITAL
Clue Cells Wet Prep HPF POC: NONE SEEN
Sperm: NONE SEEN
Trich, Wet Prep: NONE SEEN
WBC, Wet Prep HPF POC: >=10 — AB (ref <10)
Yeast Wet Prep HPF POC: NONE SEEN

## 2022-07-31 MED ORDER — CYCLOBENZAPRINE HCL 5 MG PO TABS: 5.0000 mg | ORAL_TABLET | Freq: Three times a day (TID) | ORAL | 0 refills | Status: DC | PRN

## 2022-07-31 NOTE — Progress Notes (Signed)
   OBSTETRICS PRENATAL VIRTUAL VISIT ENCOUNTER NOTE  Provider location: [facility] [place] [facility]   Patient location: Home  I connected with Jacqueline [place] Dart on 07/31/22 at  1:50 PM EST by MyChart Video Encounter and verified that I am speaking with the correct person using two identifiers. I discussed the limitations, risks, security and privacy concerns of performing an evaluation and management service virtually and the availability of in person appointments. I also discussed with the patient that there may be a patient responsible charge related to this service. The patient expressed understanding and agreed to proceed. Subjective:  Jacqueline [name] is a 22 y.o. [phone] at 47w0dbeing seen today for ongoing prenatal care.  She is currently monitored for the following issues for this high-risk pregnancy and has Hx of preeclampsia, prior pregnancy, currently pregnant; Hx of postpartum hemorrhage, currently pregnant; Short interval between pregnancies complicating pregnancy, antepartum; Current nicotine use; Birth control counseling; Supervision of other normal pregnancy, antepartum; Nausea and vomiting in pregnancy; and Unwanted fertility on their problem list.  Patient reports  pelvic pressure .  Contractions: Irritability. Vag. Bleeding: None.  Movement: Present. Denies any leaking of fluid.   The following portions of the patient's history were reviewed and updated as appropriate: allergies, current medications, past family history, past medical history, past social history, past surgical history and problem list.   Objective:  There were no vitals filed for this visit.  Fetal Status:     Movement: Present     General:  Alert, oriented and cooperative. Patient is in no acute distress.  Respiratory: Normal respiratory effort, no problems with respiration noted  Mental Status: Normal mood and affect. Normal behavior. Normal judgment and thought content.  Rest of physical exam  deferred due to type of encounter  Imaging: No results found.  Assessment and Plan:  Pregnancy: G3P1103 at 362w0d. Supervision of other normal pregnancy, antepartum  2. Late prenatal care  3. Short interval between pregnancies complicating pregnancy, antepartum  4. Hx of preeclampsia, prior pregnancy, currently pregnant - Baby ASA Rx  5. Hx of postpartum hemorrhage, currently pregnant   Preterm labor symptoms and general obstetric precautions including but not limited to vaginal bleeding, contractions, leaking of fluid and fetal movement were reviewed in detail with the patient. I discussed the assessment and treatment plan with the patient. The patient was provided an opportunity to ask questions and all were answered. The patient agreed with the plan and demonstrated an understanding of the instructions. The patient was advised to call back or seek an in-person office evaluation/[facility] or concerning symptoms. Please refer to After Visit Summary for other counseling recommendations.   I have spent a total of 20 minutes of non-face-to-face time, excluding clinical staff time, reviewing notes and preparing to see patient, ordering tests and/or medications, and counseling the patient.   Return in about 2 weeks (around 08/14/2022) for ROShepherd Future Appointments  Date Time Provider [place]2/28/2024  1:50 PM HaShelly BombardMD [facility]  08/13/2022 11:15 AM [facility]SE [facility]/05/2023 11:30 AM [facility] US3 [facility]

## 2022-07-31 NOTE — MAU Note (Signed)
.  [place] Jacqueline [name] is a 22 y.o. at 8w0dhere in MAU reporting: Pt reports vaginal pain and pressure for 1 week. Pt reports vaginal swelling for 3-4 days. Pt reports that when she lays on her left side she has pain all over. Pt reports she tries to turn over to the other side, but is not able. Pt reports she gets up multiple times to go to the bathroom and finds herself limping. Pt reports she almost falls with every trip to bathroom or checking on her other kids. Pt reports she feels the baby move lower than her pelvic bone.   Onset of complaint: 1 week ago  Pain score: 10/10 vaginal pain and pressure  There were no vitals filed for this visit.    Lab orders placed from triage:   ua

## 2022-08-01 LAB — GC/CHLAMYDIA PROBE AMP (~~LOC~~) NOT AT ARMC
Chlamydia: NEGATIVE
Comment: NEGATIVE
Comment: NORMAL
Neisseria Gonorrhea: NEGATIVE

## 2022-08-06 ENCOUNTER — Encounter: Payer: Self-pay | Admitting: Obstetrics

## 2022-08-13 ENCOUNTER — Ambulatory Visit: Payer: BLUE CROSS/BLUE SHIELD | Admitting: *Deleted

## 2022-08-13 ENCOUNTER — Ambulatory Visit: Payer: BLUE CROSS/BLUE SHIELD | Attending: Maternal & Fetal Medicine

## 2022-08-13 VITALS — BP 103/50 | HR 92

## 2022-08-13 DIAGNOSIS — O4693 Antepartum hemorrhage, unspecified, third trimester: Secondary | ICD-10-CM | POA: Diagnosis not present

## 2022-08-13 DIAGNOSIS — O09293 Supervision of pregnancy with other poor reproductive or obstetric history, third trimester: Secondary | ICD-10-CM | POA: Insufficient documentation

## 2022-08-13 DIAGNOSIS — O26843 Uterine size-date discrepancy, third trimester: Secondary | ICD-10-CM | POA: Insufficient documentation

## 2022-08-13 DIAGNOSIS — O0933 Supervision of pregnancy with insufficient antenatal care, third trimester: Secondary | ICD-10-CM | POA: Diagnosis not present

## 2022-08-13 DIAGNOSIS — O09213 Supervision of pregnancy with history of pre-term labor, third trimester: Secondary | ICD-10-CM

## 2022-08-13 DIAGNOSIS — O1493 Unspecified pre-eclampsia, third trimester: Secondary | ICD-10-CM | POA: Insufficient documentation

## 2022-08-13 DIAGNOSIS — O09893 Supervision of other high risk pregnancies, third trimester: Secondary | ICD-10-CM | POA: Diagnosis present

## 2022-08-13 DIAGNOSIS — Z3480 Encounter for supervision of other normal pregnancy, unspecified trimester: Secondary | ICD-10-CM | POA: Insufficient documentation

## 2022-08-13 DIAGNOSIS — Z3A34 34 weeks gestation of pregnancy: Secondary | ICD-10-CM | POA: Diagnosis not present

## 2022-08-14 ENCOUNTER — Encounter: Payer: BLUE CROSS/BLUE SHIELD | Admitting: Obstetrics

## 2022-08-15 ENCOUNTER — Encounter: Payer: BLUE CROSS/BLUE SHIELD | Admitting: Obstetrics

## 2022-08-18 ENCOUNTER — Other Ambulatory Visit: Payer: Self-pay

## 2022-08-18 ENCOUNTER — Inpatient Hospital Stay (HOSPITAL_COMMUNITY)
Admission: AD | Admit: 2022-08-18 | Discharge: 2022-08-21 | DRG: 807 | Disposition: A | Discharge status: Home / Self Care | Payer: BLUE CROSS/BLUE SHIELD | Attending: Family Medicine | Admitting: Family Medicine

## 2022-08-18 ENCOUNTER — Encounter (HOSPITAL_COMMUNITY): Payer: Self-pay | Admitting: Obstetrics & Gynecology

## 2022-08-18 DIAGNOSIS — Z3480 Encounter for supervision of other normal pregnancy, unspecified trimester: Secondary | ICD-10-CM

## 2022-08-18 DIAGNOSIS — Z87891 Personal history of nicotine dependence: Secondary | ICD-10-CM

## 2022-08-18 DIAGNOSIS — Z3A35 35 weeks gestation of pregnancy: Secondary | ICD-10-CM | POA: Diagnosis not present

## 2022-08-18 DIAGNOSIS — O42913 Preterm premature rupture of membranes, unspecified as to length of time between rupture and onset of labor, third trimester: Secondary | ICD-10-CM | POA: Diagnosis present

## 2022-08-18 DIAGNOSIS — O42013 Preterm premature rupture of membranes, onset of labor within 24 hours of rupture, third trimester: Secondary | ICD-10-CM | POA: Diagnosis not present

## 2022-08-18 DIAGNOSIS — O99334 Smoking (tobacco) complicating childbirth: Secondary | ICD-10-CM | POA: Diagnosis not present

## 2022-08-18 DIAGNOSIS — O42919 Preterm premature rupture of membranes, unspecified as to length of time between rupture and onset of labor, unspecified trimester: Principal | ICD-10-CM | POA: Diagnosis present

## 2022-08-18 DIAGNOSIS — O09299 Supervision of pregnancy with other poor reproductive or obstetric history, unspecified trimester: Secondary | ICD-10-CM

## 2022-08-18 DIAGNOSIS — O09899 Supervision of other high risk pregnancies, unspecified trimester: Secondary | ICD-10-CM

## 2022-08-18 DIAGNOSIS — Z720 Tobacco use: Secondary | ICD-10-CM | POA: Diagnosis present

## 2022-08-18 LAB — CBC
HCT: 31.8 % — ABNORMAL LOW (ref 36.0–46.0)
Hemoglobin: 10.8 g/dL — ABNORMAL LOW (ref 12.0–15.0)
MCH: 30.3 pg (ref 26.0–34.0)
MCHC: 34 g/dL (ref 30.0–36.0)
MCV: 89.1 fL (ref 80.0–100.0)
Platelets: 279 10*3/uL (ref 150–400)
RBC: 3.57 MIL/uL — ABNORMAL LOW (ref 3.87–5.11)
RDW: 13.2 % (ref 11.5–15.5)
WBC: 10.3 10*3/uL (ref 4.0–10.5)
nRBC: 0 % (ref 0.0–0.2)

## 2022-08-18 LAB — TYPE AND SCREEN
ABO/RH(D): A POS
Antibody Screen: NEGATIVE

## 2022-08-18 LAB — GROUP B STREP BY PCR: Group B strep by PCR: NEGATIVE

## 2022-08-18 MED ORDER — OXYCODONE-ACETAMINOPHEN 5-325 MG PO TABS: 1.0000 | ORAL_TABLET | ORAL | Status: DC | PRN

## 2022-08-18 MED ORDER — PENICILLIN G POT IN DEXTROSE 60000 UNIT/ML IV SOLN
3.0000 10*6.[IU] | INTRAVENOUS | Status: DC
  Administered 2022-08-19 (×3): 3 10*6.[IU] via INTRAVENOUS
  Filled 2022-08-18 (×3): qty 50

## 2022-08-18 MED ORDER — ONDANSETRON HCL 4 MG/2ML IJ SOLN: 4.0000 mg | Freq: Four times a day (QID) | INTRAMUSCULAR | Status: DC | PRN

## 2022-08-18 MED ORDER — OXYTOCIN-SODIUM CHLORIDE 30-0.9 UT/500ML-% IV SOLN: 2.5000 [IU]/h | INTRAVENOUS | Status: DC

## 2022-08-18 MED ORDER — OXYCODONE-ACETAMINOPHEN 5-325 MG PO TABS: 2.0000 | ORAL_TABLET | ORAL | Status: DC | PRN

## 2022-08-18 MED ORDER — SOD CITRATE-CITRIC ACID 500-334 MG/5ML PO SOLN: 30.0000 mL | ORAL | Status: DC | PRN

## 2022-08-18 MED ORDER — SODIUM CHLORIDE 0.9 % IV SOLN
5.0000 10*6.[IU] | Freq: Once | INTRAVENOUS | Status: AC
  Administered 2022-08-18: 5 10*6.[IU] via INTRAVENOUS
  Filled 2022-08-18: qty 5

## 2022-08-18 MED ORDER — LACTATED RINGERS IV SOLN: 500.0000 mL | INTRAVENOUS | Status: DC | PRN

## 2022-08-18 MED ORDER — LIDOCAINE HCL (PF) 1 % IJ SOLN: 30.0000 mL | INTRAMUSCULAR | Status: DC | PRN

## 2022-08-18 MED ORDER — ACETAMINOPHEN 325 MG PO TABS: 650.0000 mg | ORAL_TABLET | ORAL | Status: DC | PRN

## 2022-08-18 MED ORDER — OXYTOCIN BOLUS FROM INFUSION
333.0000 mL | Freq: Once | INTRAVENOUS | Status: DC
  Administered 2022-08-19: 333 mL via INTRAVENOUS

## 2022-08-18 MED ORDER — LACTATED RINGERS IV SOLN: INTRAVENOUS | Status: DC

## 2022-08-18 NOTE — H&P (Cosign Needed Addendum)
OBSTETRIC ADMISSION HISTORY AND PHYSICAL  Jacqueline [name] is a 22 y.o. female [phone] with IUP at [redacted]w[redacted]d by [place]  presenting for SROM check. She felt LOF aroun 1630, which was confirmed at the MAU. Contraction started around that time. She reports +FMs, no VB, no blurry vision, headaches or peripheral edema, and RUQ pain.  She plans on breast feeding. She is uncertain about having ppIUD for birth control. She received her prenatal care at [place]: By 22wk [place] --->  Estimated Date of Delivery: 09/18/22  Sono:  @[redacted]w[redacted]d , CWD, normal anatomy, cephalic presentation, posterior placenta, @[redacted]w[redacted]d , EFW 40%, AC 57%  Prenatal History/Complications:  - history of PreE in prior hospitalization, BP normal this pregnancy - history of PPH - current smoker - late to prenatal care  Past Medical History: Past Medical History:  Diagnosis Date   Asthma    Migraines    Pregnancy induced hypertension    Supervision of other normal pregnancy, antepartum 09/29/2019          Nursing Staff  Provider  Office Location    [facility]  Dating    U/S  Language    English  Anatomy US    f/u in 4 weeks to complete anatomy   Flu Vaccine   Declined   Genetic Screen   NIPS: low risk female  AFP: neg       TDaP vaccine    declined   Hgb A1C or   GTT  Early   Third trimester nl 2 hour  Rhogam    n/a     LAB RESULTS         Blood Type  --/--/A POS (08/03 1937)   Feeding Plan  Breas   Past Surgical History: Past Surgical History:  Procedure Laterality Date   WISDOM TOOTH EXTRACTION     Obstetrical History: OB History  Gravida Para Term Preterm AB Living  3 2 1 1  0 3  SAB IAB Ectopic Multiple Live Births  0 0 0 1 3    # Outcome Date GA Lbr Len/2nd Weight Sex Delivery Anes PTL Lv  3 Current           2 Term 04/27/20 [redacted]w[redacted]d 02:59 / 00:15 7 lb 7.6 oz (3.39 kg) M Vag-Spont EPI  LIV     Birth Comments: WDL  1A Preterm 10/27/18 [redacted]w[redacted]d 06:39 / 00:26 4 lb 13.1 oz (2.185 kg) F Vag-Spont EPI  LIV  1B Preterm 10/27/18 [redacted]w[redacted]d 06:39 /  00:32 5 lb 10.5 oz (2.565 kg) F Vag-Spont EPI  LIV   Social History Social History   Socioeconomic History   Marital status: Single    Spouse name: Not on file   Number of children: 2   Years of education: Not on file   Highest education level: Not on file  Occupational History   Not on file  Tobacco Use   Smoking status: Former    Types: Cigarettes    Quit date: 03/03/2018    Years since quitting: 4.4    Passive exposure: Never   Smokeless tobacco: Never  Vaping Use   Vaping Use: Former  Substance and Sexual Activity   Alcohol use: Never    Alcohol/week: 0.0 standard drinks of alcohol   Drug use: Not Currently    Types: Marijuana    Comment: NOT SMOKED DURING PREG   Sexual activity: Not Currently    Comment: pregnant   Other Topics Concern   Not on file  Social History Narrative  Lives with:  Godmother, going well   School:  is in 9th grade and is doing okay, at [place]:  Mother, aunt and godmother   Future Plans:  college   Exercise:  Cheerleading      Confidentiality was discussed with the patient and if applicable, with caregiver as well.      Patient's personal or confidential phone number: [phone]   Tobacco?  yes, has tried blacks, thinking she is going to stop   Drugs/ETOH?  no   Partner preference?  female Sexually Active?  yes, 1 partner    Pregnancy Prevention:  none, reviewed condoms & plan B   Safe at home, in school & in relationships?  yes   Safe to self?   yes, previous SI, wellcontrolled with care plan now   Guns in the home?  no       Social Determinants of Health   Financial Resource Strain: Low Risk  (10/20/2018)   Overall Financial Resource Strain (CARDIA)    Difficulty of Paying Living Expenses: Not hard at all  Food Insecurity: No Food Insecurity (08/18/2022)   Hunger Vital Sign    Worried About Running Out of Food in the Last Year: Never true    Shaw in the Last Year: Never true  Transportation Needs:  No Transportation Needs (08/18/2022)   PRAPARE - Hydrologist (Medical): No    Lack of Transportation (Non-Medical): No  Physical Activity: Not on file  Stress: No Stress Concern Present (10/20/2018)   [place]    Feeling of Stress : Only a little  Social Connections: Not on file   Family History: Family History  Problem Relation Age of Onset   Hypertension Mother    Asthma Brother    Hypertension Brother    Diabetes Brother    Hypertension Maternal Aunt    Diabetes Maternal Aunt    Autism spectrum disorder Maternal Aunt    Hypertension Maternal Grandmother    Aneurysm Maternal Uncle    Allergies: Allergies  Allergen Reactions   Feraheme [Ferumoxytol] Other (See Comments)    Nausea, back pain, dizziness   Tomato Other (See Comments)    Makes asthma worse   Medications Prior to Admission  Medication Sig Dispense Refill Last Dose   aspirin EC 81 MG tablet Take 1 tablet (81 mg total) by mouth daily. Take after 12 weeks for prevention of preeclampsia later in pregnancy 300 tablet 2 08/18/2022   Prenatal Vit-Fe Fumarate-FA (PREPLUS) 27-1 MG TABS Take 1 tablet by mouth daily. 30 tablet 13 08/18/2022   albuterol (VENTOLIN HFA) 108 (90 Base) MCG/ACT inhaler Inhale 2 puffs into the lungs every 4 (four) hours as needed for wheezing or shortness of breath. (Patient not taking: Reported on 05/21/2022) 1 each 0    cyclobenzaprine (FLEXERIL) 5 MG tablet Take 1 tablet (5 mg total) by mouth 3 (three) times daily as needed for muscle spasms. (Patient not taking: Reported on 08/13/2022) 20 tablet 0    ondansetron (ZOFRAN) 4 MG tablet Take 1 tablet (4 mg total) by mouth every 8 (eight) hours as needed for nausea or vomiting. (Patient not taking: Reported on 05/21/2022) 20 tablet 2    Review of Systems  All systems reviewed and negative except as stated in HPI  Blood pressure 120/80, pulse (!) 106,  temperature 98.3 F (36.8 C), resp. rate 18, last menstrual period 11/25/2021, SpO2  98 %. General appearance: alert, cooperative, and no distress Lungs: normal work of labor Heart: regular rate and rhythm Abdomen: soft, non-tender; bowel sounds normal Presentation: cephalic by bedside US Fetal monitoring baseline 140, mod variability,  +accel, late decel to 90 with preservation of mod variability and recovery. Cat 2 Uterine activityq 2-3 min Dilation: 1 Effacement (%): Thick Exam by:: Gaylan Gerold, CNM  Prenatal labs: ABO, Rh: --/--/A POS (03/17 1819) Antibody: NEG (03/17 1819) Rubella: 2.55 (11/14 1641) RPR: Non Reactive (01/05 0914)  HBsAg: Negative (11/14 1641)  HIV: Non Reactive (01/05 0914)  GBS: NEGATIVE/-- (03/17 1828) unknown. Pending 2 hr Glucola 70/76/57 Genetic screening NIPS low risk, AFP low risk, [place] normal Anatomy US normal  Prenatal Transfer Tool  Maternal Diabetes: No Genetic Screening: Normal Maternal Ultrasounds/Referrals: Normal Fetal Ultrasounds or other Referrals:  None Maternal Substance Abuse:  No Significant Maternal Medications:  None Significant Maternal Lab Results:  None Number of Prenatal Visits:greater than 3 verified prenatal visits Other Comments:  None  Results for orders placed or performed during the hospital encounter of 08/18/22 (from the past 24 hour(s))  CBC   Collection Time: 08/18/22  6:18 PM  Result Value Ref Range   WBC 10.3 4.0 - 10.5 K/uL   RBC 3.57 (L) 3.87 - 5.11 MIL/uL   Hemoglobin 10.8 (L) 12.0 - 15.0 g/dL   HCT 31.8 (L) 36.0 - 46.0 %   MCV 89.1 80.0 - 100.0 fL   MCH 30.3 26.0 - 34.0 pg   MCHC 34.0 30.0 - 36.0 g/dL   RDW 13.2 11.5 - 15.5 %   Platelets 279 150 - 400 K/uL   nRBC 0.0 0.0 - 0.2 %  Type and screen [place]   Collection Time: 08/18/22  6:19 PM  Result Value Ref Range   ABO/RH(D) A POS    Antibody Screen NEG    Sample Expiration      08/21/2022,2359 Performed at [facility], [address] [place], [place] [address]   Group B strep by PCR   Collection Time: 08/18/22  6:28 PM   Specimen: Vaginal/Rectal; Genital  Result Value Ref Range   Group B strep by PCR NEGATIVE NEGATIVE   Patient Active Problem List   Diagnosis Date Noted   Preterm premature rupture of membranes 08/18/2022   Nausea and vomiting in pregnancy 05/10/2022   Unwanted fertility 05/10/2022   Supervision of other normal pregnancy, antepartum 04/18/2022   Birth control counseling 09/07/2020   Hx of preeclampsia, prior pregnancy, currently pregnant 10/06/2019   Hx of postpartum hemorrhage, currently pregnant 10/06/2019   Short interval between pregnancies complicating pregnancy, antepartum 10/06/2019   Current nicotine use 10/06/2019   Assessment/Plan:  Jacqueline [name] is a 22 y.o. [phone] at [redacted]w[redacted]d here for PPROM. Desires low intervention birth and to keep her placenta. Risked out of waterbirth due to PPROM.  #Labor: Offered cytotec, pt desires expectant management for now. Counseled to allow cytotec if >/= 12hrs post SROM. Pt amenable. #Pain: Does not desire epidural #FWB: Cat 1 (had a period of Cat 2, but repositioned and recovered) #ID:  GBS unknown, given preterm birth will start PCN #MOF: breast #MOC:considering ppIUD  Jacqueline [name], M.D. PGY-2 Family Medicine Visiting Resident Faculty Practice 08/18/2022   Attestation of Supervision of Student:  I confirm that I have verified the information documented in the medical student's note and that I have also personally performed the history, physical exam and all medical decision making activities.  I  have verified that all services and findings are accurately documented in this student's note; and I agree with management and plan as outlined in the documentation. I have also made any necessary editorial changes.  Gabriel Carina, [place] for [employer], [employer] 08/18/2022 10:01 PM

## 2022-08-18 NOTE — MAU Note (Signed)
.  [place] Jacqueline [name] is a 22 y.o. at [redacted]w[redacted]d here in MAU reporting: Pt reports leaking clear fluid for 45 minutes to 1 hour.  Denies vaginal bleeding  Mild ctx's  Reports decreased fetal movement  Onset of complaint: today 45 minutes to 1 hour ago.  Pain score: 5/10 abdomen and back  There were no vitals filed for this visit.   FHT:152 Lab orders placed from triage:   none

## 2022-08-18 NOTE — Plan of Care (Signed)
  Problem: Education: Goal: Knowledge of Childbirth will improve Outcome: Progressing Goal: Ability to make informed decisions regarding treatment and plan of care will improve Outcome: Progressing Goal: Ability to state and carry out methods to decrease the pain will improve Outcome: Progressing Goal: Individualized Educational Video(s) Outcome: Progressing   Problem: Coping: Goal: Ability to verbalize concerns and feelings about labor and delivery will improve Outcome: Progressing   Problem: Life Cycle: Goal: Ability to make normal progression through stages of labor will improve Outcome: Progressing Goal: Ability to effectively push during vaginal delivery will improve Outcome: Progressing   Problem: Role Relationship: Goal: Will demonstrate positive interactions with the child Outcome: Progressing   Problem: Safety: Goal: Risk of complications during labor and delivery will decrease Outcome: Progressing   Problem: Pain Management: Goal: Relief or control of pain from uterine contractions will improve Outcome: Progressing   

## 2022-08-18 NOTE — Progress Notes (Signed)
Vertex presentation confirmed with bedside ultrasound  Jacqueline [name], CNM 08/18/22 6:16 PM

## 2022-08-19 ENCOUNTER — Encounter (HOSPITAL_COMMUNITY): Payer: Self-pay | Admitting: Obstetrics and Gynecology

## 2022-08-19 ENCOUNTER — Inpatient Hospital Stay (HOSPITAL_COMMUNITY): Payer: BLUE CROSS/BLUE SHIELD | Admitting: Anesthesiology

## 2022-08-19 DIAGNOSIS — O42013 Preterm premature rupture of membranes, onset of labor within 24 hours of rupture, third trimester: Secondary | ICD-10-CM

## 2022-08-19 DIAGNOSIS — Z3A35 35 weeks gestation of pregnancy: Secondary | ICD-10-CM

## 2022-08-19 DIAGNOSIS — O99334 Smoking (tobacco) complicating childbirth: Secondary | ICD-10-CM

## 2022-08-19 LAB — RPR: RPR Ser Ql: NONREACTIVE

## 2022-08-19 MED ORDER — SENNOSIDES-DOCUSATE SODIUM 8.6-50 MG PO TABS
2.0000 | ORAL_TABLET | Freq: Every day | ORAL | Status: DC
  Administered 2022-08-20 – 2022-08-21 (×2): 2 via ORAL
  Filled 2022-08-19 (×2): qty 2

## 2022-08-19 MED ORDER — ONDANSETRON HCL 4 MG/2ML IJ SOLN: 4.0000 mg | INTRAMUSCULAR | Status: DC | PRN

## 2022-08-19 MED ORDER — DIPHENHYDRAMINE HCL 50 MG/ML IJ SOLN: 12.5000 mg | INTRAMUSCULAR | Status: DC | PRN

## 2022-08-19 MED ORDER — DIBUCAINE (PERIANAL) 1 % EX OINT: 1.0000 | TOPICAL_OINTMENT | CUTANEOUS | Status: DC | PRN

## 2022-08-19 MED ORDER — WITCH HAZEL-GLYCERIN EX PADS: 1.0000 | MEDICATED_PAD | CUTANEOUS | Status: DC | PRN

## 2022-08-19 MED ORDER — FENTANYL-BUPIVACAINE-NACL 0.5-0.125-0.9 MG/250ML-% EP SOLN
12.0000 mL/h | EPIDURAL | Status: DC | PRN
  Administered 2022-08-19: 12 mL/h via EPIDURAL
  Filled 2022-08-19: qty 250

## 2022-08-19 MED ORDER — DIPHENHYDRAMINE HCL 25 MG PO CAPS: 25.0000 mg | ORAL_CAPSULE | Freq: Four times a day (QID) | ORAL | Status: DC | PRN

## 2022-08-19 MED ORDER — ACETAMINOPHEN 325 MG PO TABS
650.0000 mg | ORAL_TABLET | ORAL | Status: DC | PRN
  Administered 2022-08-20 – 2022-08-21 (×3): 650 mg via ORAL
  Filled 2022-08-19 (×3): qty 2

## 2022-08-19 MED ORDER — OXYTOCIN-SODIUM CHLORIDE 30-0.9 UT/500ML-% IV SOLN
1.0000 m[IU]/min | INTRAVENOUS | Status: DC
  Administered 2022-08-19: 2 m[IU]/min via INTRAVENOUS
  Filled 2022-08-19: qty 500

## 2022-08-19 MED ORDER — TETANUS-DIPHTH-ACELL PERTUSSIS 5-2.5-18.5 LF-MCG/0.5 IM SUSY: 0.5000 mL | PREFILLED_SYRINGE | Freq: Once | INTRAMUSCULAR | Status: DC

## 2022-08-19 MED ORDER — PHENYLEPHRINE 80 MCG/ML (10ML) SYRINGE FOR IV PUSH (FOR BLOOD PRESSURE SUPPORT)
80.0000 ug | PREFILLED_SYRINGE | INTRAVENOUS | Status: DC | PRN
  Filled 2022-08-19: qty 10

## 2022-08-19 MED ORDER — PHENYLEPHRINE 80 MCG/ML (10ML) SYRINGE FOR IV PUSH (FOR BLOOD PRESSURE SUPPORT): 80.0000 ug | PREFILLED_SYRINGE | INTRAVENOUS | Status: DC | PRN

## 2022-08-19 MED ORDER — LIDOCAINE-EPINEPHRINE (PF) 1.5 %-1:200000 IJ SOLN
INTRAMUSCULAR | Status: DC | PRN
  Administered 2022-08-19: 2 mL via EPIDURAL
  Administered 2022-08-19: 3 mL via EPIDURAL

## 2022-08-19 MED ORDER — LACTATED RINGERS IV SOLN: 500.0000 mL | Freq: Once | INTRAVENOUS | Status: DC

## 2022-08-19 MED ORDER — EPHEDRINE 5 MG/ML INJ: 10.0000 mg | INTRAVENOUS | Status: DC | PRN

## 2022-08-19 MED ORDER — FENTANYL CITRATE (PF) 100 MCG/2ML IJ SOLN
100.0000 ug | INTRAMUSCULAR | Status: DC | PRN
  Administered 2022-08-19 (×2): 100 ug via INTRAVENOUS
  Filled 2022-08-19 (×2): qty 2

## 2022-08-19 MED ORDER — SIMETHICONE 80 MG PO CHEW: 80.0000 mg | CHEWABLE_TABLET | ORAL | Status: DC | PRN

## 2022-08-19 MED ORDER — BENZOCAINE-MENTHOL 20-0.5 % EX AERO: 1.0000 | INHALATION_SPRAY | CUTANEOUS | Status: DC | PRN

## 2022-08-19 MED ORDER — COCONUT OIL OIL: 1.0000 | TOPICAL_OIL | Status: DC | PRN

## 2022-08-19 MED ORDER — IBUPROFEN 600 MG PO TABS
600.0000 mg | ORAL_TABLET | Freq: Four times a day (QID) | ORAL | Status: DC
  Administered 2022-08-19 – 2022-08-21 (×7): 600 mg via ORAL
  Filled 2022-08-19 (×7): qty 1

## 2022-08-19 MED ORDER — TERBUTALINE SULFATE 1 MG/ML IJ SOLN: 0.2500 mg | Freq: Once | INTRAMUSCULAR | Status: DC | PRN

## 2022-08-19 MED ORDER — ZOLPIDEM TARTRATE 5 MG PO TABS: 5.0000 mg | ORAL_TABLET | Freq: Every evening | ORAL | Status: DC | PRN

## 2022-08-19 MED ORDER — ONDANSETRON HCL 4 MG PO TABS: 4.0000 mg | ORAL_TABLET | ORAL | Status: DC | PRN

## 2022-08-19 NOTE — Progress Notes (Signed)
Labor Progress Note [place] Jacqueline [name] is a 22 y.o. [phone] at [redacted]w[redacted]d presented for PPROM.   S: No acute concerns. Walking with family.   O:  BP 121/71   Pulse 97   Temp 98.2 F (36.8 C)   Resp 17   Ht 5\' 4"  (1.626 m)   Wt 76.3 kg   LMP 11/25/2021 (Approximate)   SpO2 98%   BMI 28.87 kg/m  EFM: ***/***/***  CVE: Dilation: 3.5 Effacement (%): 50, 60 Cervical Position: Posterior Station: -1, 0 Presentation: Vertex Exam by:: Mackey Birchwood RN   A&P: 22 y.o. [id number] [redacted]w[redacted]d her for PPROM #Labor: Progressing well. *** #Pain: Maternally supported #FWB: Cat I  #GBS {pos/neg/not [id number] *** (chronic/other problems)  [name], DO 12:09 AM

## 2022-08-19 NOTE — Progress Notes (Signed)
Jacqueline [name] is a 22 y.o. [phone] at [redacted]w[redacted]d admitted for PPROM  Subjective: S/p epidural. No sharp pain but feeling pressure in the front. Very tired.    Objective: BP (!) 138/92   Pulse 91   Temp 98.2 F (36.8 C) (Oral)   Resp 16   Ht 5\' 4"  (1.626 m)   Wt 76.3 kg   LMP 11/25/2021 (Approximate)   SpO2 100%   BMI 28.87 kg/m  No intake/output data recorded. No intake/output data recorded.  FHT:  FHR: 120 bpm, variability: moderate,  accelerations:  Present,  decelerations:  Absent UC:   every 1-2 mins   SVE:   Dilation: 6 Effacement (%): 100 Station: 0 Exam by:: lee  Labs: Lab Results  Component Value Date   WBC 10.3 08/18/2022   HGB 10.8 (L) 08/18/2022   HCT 31.8 (L) 08/18/2022   MCV 89.1 08/18/2022   PLT 279 08/18/2022    Assessment / Plan: IOL 2/2 PPROM Continue to increase pitocin PRN   Labor: continue with pitocin Preeclampsia:  elevated mild range BP x1 Fetal Wellbeing:  Category I Pain Control:  s/p epidural. I/D:  n/a Anticipated MOD:  NSVD  Discussed with Marcille Buffy, CNM Jacqueline [name] "Darlyne Russian, M.D. PGY-2 Family Medicine [facility] 08/19/2022 4:58 PM

## 2022-08-19 NOTE — Anesthesia Procedure Notes (Signed)
Epidural Patient location during procedure: OB Start time: 08/19/2022 4:12 PM End time: 08/19/2022 4:28 PM  Staffing Anesthesiologist: Nolon Nations, MD Performed: anesthesiologist   Preanesthetic Checklist Completed: patient identified, IV checked, risks and benefits discussed, monitors and equipment checked, pre-op evaluation and timeout performed  Epidural Patient position: sitting Prep: DuraPrep and site prepped and draped Patient monitoring: heart rate, continuous pulse ox and blood pressure Approach: midline Location: L2-L3 Injection technique: LOR air and LOR saline  Needle:  Needle type: Tuohy  Needle gauge: 17 G Needle length: 9 cm Needle insertion depth: 5 cm Catheter type: closed end flexible Catheter size: 19 Gauge Catheter at skin depth: 11 cm Test dose: negative  Assessment Sensory level: T8 Events: blood not aspirated, no cerebrospinal fluid, injection not painful, no injection resistance, no paresthesia and negative IV test  Additional Notes Reason for block:procedure for pain

## 2022-08-19 NOTE — Anesthesia Preprocedure Evaluation (Signed)
Anesthesia Evaluation  Patient identified by MRN, date of birth, ID band Patient awake    Reviewed: Allergy & Precautions, H&P , Patient's Chart, lab work & pertinent test results  History of Anesthesia Complications Negative for: history of anesthetic complications  Airway Mallampati: II  TM Distance: >3 FB Neck ROM: full    Dental no notable dental hx.    Pulmonary asthma , former smoker   Pulmonary exam normal breath sounds clear to auscultation       Cardiovascular hypertension, Normal cardiovascular exam Rhythm:regular Rate:Normal     Neuro/Psych  Headaches  negative psych ROS   GI/Hepatic negative GI ROS, Neg liver ROS,,,  Endo/Other  negative endocrine ROS    Renal/GU negative Renal ROS  negative genitourinary   Musculoskeletal   Abdominal   Peds  Hematology negative hematology ROS (+)   Anesthesia Other Findings   Reproductive/Obstetrics (+) Pregnancy                             Anesthesia Physical Anesthesia Plan  ASA: 2  Anesthesia Plan: Epidural   Post-op Pain Management:    Induction:   PONV Risk Score and Plan:   Airway Management Planned:   Additional Equipment:   Intra-op Plan:   Post-operative Plan:   Informed Consent: I have reviewed the patients History and Physical, chart, labs and discussed the procedure including the risks, benefits and alternatives for the proposed anesthesia with the patient or authorized representative who has indicated his/her understanding and acceptance.       Plan Discussed with:   Anesthesia Plan Comments:         Anesthesia Quick Evaluation

## 2022-08-19 NOTE — Progress Notes (Signed)
Labor Progress Note [place] Kana is a 22 y.o. [phone] at [redacted]w[redacted]d presented for PPROM. Reports that water broke 3/17 at 2pm.  S: Pt interested in IV pain meds and augmentation.  O:  BP (!) 109/53   Pulse 89   Temp 98.2 F (36.8 C)   Resp 18   Ht 5\' 4"  (1.626 m)   Wt 76.3 kg   LMP 11/25/2021 (Approximate)   SpO2 98%   BMI 28.87 kg/m  EFM: 125/6-25/15x15  CVE: Dilation: 3.5 Effacement (%): 60 Cervical Position: Posterior Station: -2 Presentation: Vertex Exam by:: Autry-Lott DO   A&P: 22 y.o. [id number] [redacted]w[redacted]d admitted for PPROM. #Labor: Start pitocin. #Pain: IV pain meds #FWB: cat 1  #GBS  unknown, on PCN ppx.   Arlyce Dice, MD [facility] 3:38 AM

## 2022-08-19 NOTE — Discharge Summary (Signed)
Postpartum Discharge Summary  Date of Service updated***     Patient Name: Jacqueline [name] DOB: [birth year] MRN: [id number]  Date of admission: 08/18/2022 Delivery date:08/19/2022  Delivering provider: Marcille Buffy D  Date of discharge: 08/19/2022  Admitting diagnosis: Preterm premature rupture of membranes [O42.919] Intrauterine pregnancy: [redacted]w[redacted]d     Secondary diagnosis:  Principal Problem:   Preterm premature rupture of membranes Active Problems:   Hx of preeclampsia, prior pregnancy, currently pregnant   Hx of postpartum hemorrhage, currently pregnant   Short interval between pregnancies complicating pregnancy, antepartum   Current nicotine use   Supervision of other normal pregnancy, antepartum  Additional problems: ***    Discharge diagnosis: {DX.:23714}                                              Post partum procedures:{Postpartum procedures:23558} Augmentation: Pitocin Complications: None  Hospital course: Induction of Labor With Vaginal Delivery   22 y.o. yo [phone] at [redacted]w[redacted]d was admitted to the hospital 08/18/2022 for induction of labor.  Indication for induction: PROM.  Patient had an labor course complicated by none Membrane Rupture Time/Date: 2:00 PM ,08/18/2022   Delivery Method: NSVD Episiotomy:  None Lacerations:    None Details of delivery can be found in separate delivery note.  Patient had a postpartum course complicated by***. Patient is discharged home 08/19/22.  Newborn Data: Birth date:08/19/2022  Birth time:5:32 PM  Gender:Female  Living status:  Apgars: ,  Weight:   Magnesium Sulfate received: No BMZ received: No Rhophylac:N/A MMR:N/A T-DaP: declined Flu: N/A Transfusion:{Transfusion received:30440034}  Physical exam  Vitals:   08/19/22 1636 08/19/22 1640 08/19/22 1646 08/19/22 1701  BP: 124/75 (!) 138/92 135/67 (!) 130/100  Pulse: 78 91 64 83  Resp:      Temp:      TempSrc:      SpO2:      Weight:      Height:       General:  {Exam; general:21111117} Lochia: {Desc; appropriate/inappropriate:30686::"appropriate"} Uterine Fundus: {Desc; firm/soft:30687} Incision: {Exam; incision:21111123} DVT Evaluation: {Exam; dvt:2111122} Labs: Lab Results  Component Value Date   WBC 10.3 08/18/2022   HGB 10.8 (L) 08/18/2022   HCT 31.8 (L) 08/18/2022   MCV 89.1 08/18/2022   PLT 279 08/18/2022      Latest Ref Rng & Units 05/10/2022    9:52 AM  CMP  Glucose 70 - 99 mg/dL 81   BUN 6 - 20 mg/dL 8   Creatinine 0.57 - 1.00 mg/dL 0.68   Sodium 134 - 144 mmol/L 136   Potassium 3.5 - 5.2 mmol/L 4.1   Chloride 96 - 106 mmol/L 102   CO2 20 - 29 mmol/L 18   Calcium 8.7 - 10.2 mg/dL 8.9   Total Protein 6.0 - 8.5 g/dL 6.8   Total Bilirubin 0.0 - 1.2 mg/dL 0.4   Alkaline Phos 44 - 121 IU/L 57   AST 0 - 40 IU/L 9   ALT 0 - 32 IU/L 6    Edinburgh Score:    04/29/2020    9:30 AM  Edinburgh Postnatal Depression Scale Screening Tool  I have been able to laugh and see the funny side of things. 0  I have looked forward with enjoyment to things. 0  I have blamed myself unnecessarily when things went wrong. 0  I have been anxious or worried for  no good reason. 0  I have felt scared or panicky for no good reason. 0  Things have been getting on top of me. 0  I have been so unhappy that I have had difficulty sleeping. 0  I have felt sad or miserable. 1  I have been so unhappy that I have been crying. 1  The thought of harming myself has occurred to me. 0  Edinburgh Postnatal Depression Scale Total 2     After visit meds:  Allergies as of 08/19/2022       Reactions   Feraheme [ferumoxytol] Other (See Comments)   Nausea, back pain, dizziness   Tomato Other (See Comments)   Makes asthma worse     Med Rec must be completed prior to using this [facility]***        Discharge home in stable condition Infant Feeding: {Baby feeding:23562} Infant Disposition:{CHL IP OB HOME WITH [id number] Discharge instruction: per  After Visit Summary and Postpartum booklet. Activity: Advance as tolerated. Pelvic rest for 6 weeks.  Diet: {OB TF:[id number] Future Appointments: Future Appointments  Date Time Provider [place]  08/21/2022  9:55 AM Griffin Basil, MD [facility] None  08/28/2022 10:35 AM Shelly Bombard, MD [place] None  09/04/2022 10:35 AM Laury Deep, CNM [facility] None  09/11/2022 10:35 AM Gavin Pound, CNM [facility] None  09/18/2022 10:35 AM Autry-Lott, Naaman Plummer, DO [facility] None   Follow up Visit:   Please schedule this patient for a In person postpartum visit in 6 weeks with the following provider: Any provider. Additional Postpartum F/U: none   Low risk pregnancy complicated by:  none Delivery mode:   NSVD Anticipated Birth Control:   considering IUD

## 2022-08-19 NOTE — Progress Notes (Signed)
[place] Bayless is a 22 y.o. [phone] at [redacted]w[redacted]d admitted for PPROM  Subjective: Doing well. Starting to feel more contractions. Not planning an epidural at this time.   NSVD of twins in 2020 and then NSVD of singleton in 2021.   Objective: BP 116/66   Pulse 84   Temp 97.6 F (36.4 C)   Resp 18   Ht 5\' 4"  (1.626 m)   Wt 76.3 kg   LMP 11/25/2021 (Approximate)   SpO2 98%   BMI 28.87 kg/m  No intake/output data recorded. No intake/output data recorded.  FHT:  FHR: 125 bpm, variability: moderate,  accelerations:  Present,  decelerations:  Absent UC:   occasional  SVE:   Dilation: 4 Effacement (%): 60 Station: -2 Exam by:: lee  Labs: Lab Results  Component Value Date   WBC 10.3 08/18/2022   HGB 10.8 (L) 08/18/2022   HCT 31.8 (L) 08/18/2022   MCV 89.1 08/18/2022   PLT 279 08/18/2022    Assessment / Plan: IOL 2/2 PPROM Continue to increase pitocin PRN   Labor:  still in early phase of IOL  Preeclampsia:   NA Fetal Wellbeing:  Category I Pain Control:  Labor support without medications I/D:  n/a Anticipated MOD:  NSVD    Marcille Buffy DNP, CNM  08/19/22  10:04 AM

## 2022-08-20 NOTE — Lactation Note (Addendum)
This note was copied from a baby's chart. Lactation Consultation Note  Patient Name: Jacqueline [name] [id number] Date: 08/20/2022 Age:22 hours Reason for consult: Initial assessment;Late-preterm 34-36.6wks;Infant < 6lbs  P4, Reviewed late preterm feeding guidelines and suggest mother call for next feeding.  Suggest breastfeeding baby for 15 min and then supplement after.   Plan: Offer breast when baby cues that he is hungry, or awaken baby for feeding at 3 hrs.  Breast feed baby, asking for help prn.  Limit total feeding to 30 mins including bottle feeding so not to overtire baby. If baby does not latch after 5-10 min of attempt - give supplemental breastmilk/formula.  Slow flow nipple bottle is an option.  Pump both breasts 15-20 minutes q 3 hours on initiation setting, adding breast massage and hand expression to collect as much colostrum as possible to feed baby.  Feed baby 12-14 ml EBM+/formula after breastfeeding per LPTI volume guidelines increasing per day of life and as baby desires.    Maternal Data Has patient been taught Hand Expression?: Yes Does the patient have breastfeeding experience prior to this delivery?: Yes How long did the patient breastfeed?: 2-3 mos. with twins, 4 mos with first child  Feeding Mother's Current Feeding Choice: Breast Milk and Formula Nipple Type: Slow - flow  Lactation Tools Discussed/Used Tools: Pump Breast pump type: Double-Electric Breast Pump Pump Education: Milk Storage Reason for Pumping: stimulation and supplementation Pumping frequency: q 3 hours  Interventions Interventions: Education;DEBP;LC Services brochure  Discharge Pump: Personal;Hands Free (Plans to purchase)  Does not want to use DEBP at home due to other children.   Consult Status Consult Status: Follow-up Date: 08/20/22 Follow-up type: In-patient    Vivianne Master [facility] 08/20/2022, 8:43 AM

## 2022-08-20 NOTE — Progress Notes (Signed)
POSTPARTUM PROGRESS NOTE  Post Partum Day 1  Subjective:  Jacqueline [name] is a 22 y.o. [id number] s/p SVD following PPROM at [redacted]w[redacted]d.  She reports she is doing well. No acute events overnight. She denies any problems with ambulating, voiding or po intake. Denies nausea or vomiting.  Pain is well controlled.  Lochia is minimal, not foul smelling.  Objective: Blood pressure 111/65, pulse (!) 56, temperature 98.2 F (36.8 C), temperature source Oral, resp. rate 16, height 5\' 4"  (1.626 m), weight 76.3 kg, last menstrual period 11/25/2021, SpO2 100 %, unknown if currently breastfeeding.  Physical Exam:  General: alert, cooperative and no distress Chest: no respiratory distress Heart:regular rate, distal pulses intact Abdomen: soft, nontender,  Uterine Fundus: firm, appropriately tender DVT Evaluation: No calf swelling or tenderness Extremities: no edema Skin: warm, dry  Recent Labs    08/18/22 1818  HGB 10.8*  HCT 31.8*    Assessment/Plan: Jac[place]eline [place] Rusnak is a 22 y.o. [phone] s/p SVD following PPROM at [redacted]w[redacted]d   PPD#1 - Doing well  Routine postpartum care   Contraception: possible IUD Feeding: breast Dispo: Plan for discharge routine, with baby.   LOS: 2 days   Enid Baas, MD Family Medicine, PGY1 08/20/2022, 10:18 AM

## 2022-08-20 NOTE — Anesthesia Postprocedure Evaluation (Signed)
Anesthesia Post Note  Patient: Jacqueline [name]  Procedure(s) Performed: AN AD [place]     Patient location during evaluation: Mother Baby Anesthesia Type: Epidural Level of consciousness: awake, oriented and awake and alert Pain management: pain level controlled Vital Signs Assessment: post-procedure vital signs reviewed and stable Respiratory status: spontaneous breathing, respiratory function stable and nonlabored ventilation Cardiovascular status: stable Postop Assessment: no headache, adequate PO intake, able to ambulate, patient able to bend at knees and no apparent nausea or vomiting Anesthetic complications: no   No notable events documented.  Last Vitals:  Vitals:   08/20/22 0302 08/20/22 0629  BP: 116/77 111/70  Pulse: 64 63  Resp: 18 18  Temp: 36.7 C 36.7 C  SpO2: 99% 100%    Last Pain:  Vitals:   08/20/22 0629  TempSrc: Oral  PainSc: 0-No pain   Pain Goal: Patients Stated Pain Goal: 0 (08/19/22 0659)                 Marcy Siren

## 2022-08-21 ENCOUNTER — Encounter: Payer: BLUE CROSS/BLUE SHIELD | Admitting: Obstetrics and Gynecology

## 2022-08-21 ENCOUNTER — Other Ambulatory Visit (HOSPITAL_COMMUNITY): Payer: Self-pay

## 2022-08-21 MED ORDER — ACETAMINOPHEN 325 MG PO TABS
650.0000 mg | ORAL_TABLET | ORAL | 0 refills | Status: AC | PRN
  Filled 2022-08-21: qty 60, 5d supply, fill #0

## 2022-08-21 MED ORDER — IBUPROFEN 600 MG PO TABS
600.0000 mg | ORAL_TABLET | Freq: Four times a day (QID) | ORAL | 0 refills | Status: DC
  Filled 2022-08-21: qty 30, 8d supply, fill #0

## 2022-08-21 NOTE — Progress Notes (Signed)
Circumcision Consent  Discussed with mom at bedside about circumcision.   Circumcision is a surgery that removes the skin that covers the tip of the penis, called the "foreskin." Circumcision is usually done when a boy is between 35 and 30 days old, sometimes up to 51-10 weeks old.  The most common reasons boys are circumcised include for cultural/religious beliefs or for parental preference (potentially easier to clean, so baby looks like daddy, etc).  There may be some medical benefits for circumcision:   Circumcised boys seem to have slightly lower rates of: ? Urinary tract infections (per the American Academy of Pediatrics an uncircumcised boy has a 1/100 chance of developing a UTI in the first year of life, a circumcised boy at a 06/998 chance of developing a UTI in the first year of life- a 10% reduction) ? Penis cancer (typically rare- an uncircumcised female has a 1 in 100,000 chance of developing cancer of the penis) ? Sexually transmitted infection (in endemic areas, including HIV, HPV and Herpes- circumcision does NOT protect against gonorrhea, chlamydia, trachomatis, or syphilis) ? Phimosis: a condition where that makes retraction of the foreskin over the glans impossible (0.4 per 1000 boys per year or 0.6% of boys are affected by their 15th birthday)  Boys and men who are not circumcised can reduce these extra risks by: ? Cleaning their penis well ? Using condoms during sex  What are the risks of circumcision?  As with any surgical procedure, there are risks and complications. In circumcision, complications are rare and usually minor, the most common being: ? Bleeding- risk is reduced by holding each clamp for 30 seconds prior to a cut being made, and by holding pressure after the procedure is done ? Infection- the penis is cleaned prior to the procedure, and the procedure is done under sterile technique ? Damage to the urethra or amputation of the penis  How is circumcision done  in baby boys?  The baby will be placed on a special table and the legs restrained for their safety. Numbing medication is injected into the penis, and the skin is cleansed with betadine to decrease the risk of infection.   What to expect:  The penis will look red and raw for 5-7 days as it heals. We expect scabbing around where the cut was made, as well as clear-pink fluid and some swelling of the penis right after the procedure. If your baby's circumcision starts to bleed or develops pus, please contact your pediatrician immediately.  All questions were answered and mother consented.  Concepcion Living, MD 6:58 AM

## 2022-08-22 ENCOUNTER — Ambulatory Visit (HOSPITAL_COMMUNITY): Payer: Self-pay

## 2022-08-22 NOTE — Lactation Note (Signed)
This note was copied from a baby's chart. Lactation Consultation Note  Patient Name: Jacqueline [name] [id number] Date: 08/22/2022 Age:22 hours Reason for consult: Follow-up assessment;Infant weight loss;Mother's request (-6.20 % weight loss, close spaced pregnancies, see Birth Parent- MR)  LC entered the room, Per Birth Parent, infant BF for 10 minutes and given 37 mls of 22 kcal formula using purple and clear slow flow bottle nipple. Per Birth Parent, infant is wanting more supplement and doing well with the purple and clear extra slow flow bottle nipple. LC did not observe latch due infant recently feeding at 2245 pm. Birth Parent complains that her breast is full, leaking and starting to hurt. LC observed Birth Parent had breast fullness, LC re-fitted Birth Parent with 21 mm breast flange which she felt was much better, Birth Parent express 55 mls of colostrum when using the DEBP. Per [employer] Parent , she had not been using DEBP due tiredness and pain when pumping, LC explained she was previously using wrong flange size. Birth Parent plans to offer infant 35 mls of pumped expressed breast milk at the next feeding after she latches infant at the breast. Birth Parent knows EBM is safe at room temperature for 4 hours whereas formula must be used within 1 hour.   Birth Parent current feeding plan: 1- Continue to follow LPTI feeding guidelines, BF 8+ times within 24 hours, Limit chest feeding 15 minutes or less and total feedings to no more than 30 minutes. 2- Birth Parent will continue to supplement infant with any EBM first and then 22 kcal formula each feeding to help stabilize infant's weight loss, based on infant's age Birth Parent will offer 24-28 mls per feeding or more if infant wants it.  3- Birth Parent will continue to use DEBP every 3 hours for 15 minutes on initial setting.  Maternal Data    Feeding Mother's Current Feeding Choice: Breast Milk and Formula  LATCH Score  LC did not  observe latch, Birth Parent feed infant prior to [facility] entering the room.                  Lactation Tools Discussed/Used Tools: Pump Flange Size: 21 Breast pump type: Double-Electric Breast Pump Pump Education: Setup, frequency, and cleaning;Milk Storage Reason for Pumping: Infant is LPTI, high infant weight loss, less than 6 lbs at birth Pumping frequency: Birth Parent will continue to pump every 3 hours for 15 minutes on inital setting Pumped volume: 55 mL  Interventions Interventions: Education;DEBP;LPT handout/interventions;Breast compression  Discharge Pump:  ([place] sent referral for Stork DEBP)  Consult Status Consult Status: Follow-up Date: 08/22/22 Follow-up type: In-patient    Eulis Canner 08/22/2022, 12:44 AM

## 2022-08-22 NOTE — Lactation Note (Signed)
This note was copied from a baby's chart. Lactation Consultation Note  Patient Name: Boy [place] Mantione [id number] Date: 08/22/2022 Age:22 hours Reason for consult: Follow-up assessment  Baby [redacted]w[redacted]d.  Mother is breastfeeding and supplementing with formula. She recently pumped 70 ml.  Breasts are full.  Encouraged mother to pump.  Reviewed engorgement care and monitoring voids/stools. Feed on demand with cues.  Goal 8-12+ times per day after first 24 hrs.  Waiting to hear if eligible for stork pump.   Maternal Data Has patient been taught Hand Expression?: Yes  Feeding Mother's Current Feeding Choice: Breast Milk and Formula  Lactation Tools Discussed/Used Tools: Pump Pumped volume: 70 mL  Interventions Interventions: Education;DEBP  Discharge Discharge Education: Engorgement and breast care;Warning signs for feeding baby Pump: Stork Pump (referral sent)  Consult Status Consult Status: Complete Date: 08/22/22    Vivianne Master [facility] 08/22/2022, 10:51 AM

## 2022-08-28 ENCOUNTER — Encounter: Payer: BLUE CROSS/BLUE SHIELD | Admitting: Obstetrics

## 2022-08-30 ENCOUNTER — Telehealth (HOSPITAL_COMMUNITY): Payer: Self-pay | Admitting: *Deleted

## 2022-08-30 NOTE — Telephone Encounter (Signed)
Left phone voicemail message.  Odis Hollingshead, RN 08-30-2022 at 2:50pm

## 2022-09-04 ENCOUNTER — Encounter: Payer: BLUE CROSS/BLUE SHIELD | Admitting: Obstetrics and Gynecology

## 2022-09-18 ENCOUNTER — Encounter: Payer: BLUE CROSS/BLUE SHIELD | Admitting: Family Medicine

## 2022-10-03 ENCOUNTER — Ambulatory Visit: Payer: BLUE CROSS/BLUE SHIELD | Admitting: Obstetrics and Gynecology

## 2022-10-10 ENCOUNTER — Ambulatory Visit: Payer: BLUE CROSS/BLUE SHIELD | Admitting: Obstetrics

## 2022-10-17 ENCOUNTER — Ambulatory Visit (INDEPENDENT_AMBULATORY_CARE_PROVIDER_SITE_OTHER): Payer: BLUE CROSS/BLUE SHIELD

## 2022-10-17 ENCOUNTER — Other Ambulatory Visit (HOSPITAL_COMMUNITY)
Admission: RE | Admit: 2022-10-17 | Discharge: 2022-10-17 | Disposition: A | Discharge status: Home / Self Care | Payer: BLUE CROSS/BLUE SHIELD | Source: Ambulatory Visit | Attending: Obstetrics and Gynecology | Admitting: Obstetrics and Gynecology

## 2022-10-17 DIAGNOSIS — Z30430 Encounter for insertion of intrauterine contraceptive device: Secondary | ICD-10-CM

## 2022-10-17 DIAGNOSIS — Z3202 Encounter for pregnancy test, result negative: Secondary | ICD-10-CM | POA: Diagnosis not present

## 2022-10-17 DIAGNOSIS — Z124 Encounter for screening for malignant neoplasm of cervix: Secondary | ICD-10-CM

## 2022-10-17 LAB — POCT URINE PREGNANCY: Preg Test, Ur: NEGATIVE

## 2022-10-17 MED ORDER — LEVONORGESTREL 20 MCG/DAY IU IUD
1.0000 | INTRAUTERINE_SYSTEM | Freq: Once | INTRAUTERINE | Status: AC
  Administered 2022-10-17: 1 via INTRAUTERINE

## 2022-10-17 NOTE — Progress Notes (Signed)
Post Partum Visit Note  [place] Mendell is a 22 y.o. [phone] female who presents for a postpartum visit. She is 8 weeks postpartum following a normal spontaneous vaginal delivery.  I have fully reviewed the prenatal and intrapartum course. The delivery was at 35 gestational weeks.  Anesthesia: epidural. Postpartum course has been good. Baby is doing well. Baby is feeding by both breast and bottle - Similac Neosure. Bleeding no bleeding. Bowel function is normal. Bladder function is normal. Patient is sexually active. Contraception method is IUD. Postpartum depression screening: negative.  Patient reports that baby is doing well and she receives help from infant's god mom.  Patient denies feelings of depression or anxiety.  She reports sexually active with female since delivery. She reports breastfeeding is going well and denies issues with breast. She endorses safety at home.  The pregnancy intention screening data noted above was reviewed. Potential methods of contraception were discussed. The patient elected to proceed with No data recorded.   Edinburgh Postnatal Depression Scale - 10/17/22 1515       Edinburgh Postnatal Depression Scale:  In the Past 7 Days   I have been able to laugh and see the funny side of things. 3    I have looked forward with enjoyment to things. 0    I have blamed myself unnecessarily when things went wrong. 0    I have been anxious or worried for no good reason. 0    I have felt scared or panicky for no good reason. 0    Things have been getting on top of me. 1    I have been so unhappy that I have had difficulty sleeping. 0    I have felt sad or miserable. 0    I have been so unhappy that I have been crying. 0    The thought of harming myself has occurred to me. 0    Edinburgh Postnatal Depression Scale Total 4             Health Maintenance Due  Topic Date Due   COVID-19 Vaccine (1) Never done   HPV VACCINES (1 - 2-dose series) Never done    PAP-Cervical Cytology Screening  Never done   PAP SMEAR-Modifier  Never done    The following portions of the patient's history were reviewed and updated as appropriate: allergies, current medications, past family history, past medical history, past social history, past surgical history, and problem list.  Review of Systems Pertinent items noted in HPI and remainder of comprehensive ROS otherwise negative.  Objective:  BP 121/75   Pulse 71   Ht 5\' 4"  (1.626 m)   Wt 159 lb 9.6 oz (72.4 kg)   LMP 11/25/2021 (Approximate)   Breastfeeding Yes   BMI 27.40 kg/m    General:  alert, cooperative, and no distress   Breasts:  Not evaluated  Lungs: clear to auscultation bilaterally  Heart:  regular rate and rhythm  Abdomen: soft, non-tender; bowel sounds normal; no masses,  no organomegaly   Wound N/A  GU exam:  normal Vaginal vault pink and without discharge. Cervix pink and without lesions or cysts. Appears slightly open. No active bleeding or discharge. IUD inserted w/o issues.        Assessment:   Postpartum State Desires IUD S/P Vaginal Delivery Normal Involution H/O GHTN and PreE  Plan:   -Problem list updated and pregnancy problems resolved. -BP normotensive. -Discussed following up with primary care provider for any further issues with  bp.  -List provided in AVS.  -Reviewed IUD insertion risks and benefits of placement. Patient agreeable.  -Note requested and given, via mychart, for return to work.   Essential components of care per ACOG recommendations:  1.  Mood and well being: Patient with negative depression screening today. Reviewed local resources for support.  - Patient tobacco use? No.   - hx of drug use? No.    2. Infant care and feeding:  -Patient currently breastmilk feeding? Yes. Reviewed importance of draining breast regularly to support lactation. She denies issues with pumping and pumps 2-3 bags each day.  -Social determinants of health (SDOH) reviewed  in EPIC. No concerns  3. Sexuality, contraception and birth spacing - Patient does not want a pregnancy in the next year.  Desired family size is 4 children.  - Reviewed reproductive life planning. Reviewed contraceptive methods based on pt preferences and effectiveness.  Patient desired IUD or IUS today.   - Discussed birth spacing of 18 months  4. Sleep and fatigue -Encouraged family/partner/community support of 4 hrs of uninterrupted sleep to help with mood and fatigue  5. Physical Recovery  - Discussed patients delivery and complications. She describes her labor as good.-timely and fast.  - Patient had a Vaginal, no problems at delivery. Patient had a  no  laceration. Patient expressed understanding - Patient has urinary incontinence? No. - Patient is safe to resume physical and sexual activity  6.  Health Maintenance - HM due items addressed Yes - Last pap smear No results found for: "DIAGPAP" Pap smear done at today's visit.  -Breast Cancer screening indicated? No.   7. Chronic Disease/Pregnancy Condition follow up: None  - PCP follow up  Cherre Robins, CNM [facility] [employer], [facility]

## 2022-10-17 NOTE — Progress Notes (Signed)
    GYNECOLOGY OFFICE PROCEDURE NOTE  [name] is a 22 y.o. [phone] here for Mirena IUD insertion. No GYN concerns.  Pap smear was completed today.   IUD Insertion Procedure Note IUD: Mirena    Patient identified, informed consent performed, consent signed.   Discussed risks of irregular bleeding, cramping, infection, malpositioning or misplacement of the IUD outside the uterus which may require further procedure such as laparoscopy. Time out was performed.  Urine pregnancy test negative.  Bimanual exam performed and uterus of normal size, non-tender, and retroflex position. Speculum placed in the vagina and cervix visualized.  Cervix and vaginal walls cleaned x 3 with betadine solution. Posterior aspect grasped with a single tooth tenaculum.  Uterus sounded to 8 cm.  Mirena IUD placed per manufacturer's recommendations.  Strings trimmed to ~3 cm. Tenaculum was removed, but active bleeding not resolved with pressure on left side.  Silver nitrate applied and hemostasis noted.  Patient tolerated procedure well.   Patient was given post-procedure instructions.  She was advised to have backup contraception for one week.  Patient also instructed to follow up in 4 weeks for IUD check and call and/or report any issues prior to next visit.  Cherre Robins, CNM 10/17/2022

## 2022-10-23 LAB — CYTOLOGY - PAP: Diagnosis: NEGATIVE

## 2022-11-14 ENCOUNTER — Ambulatory Visit: Payer: BLUE CROSS/BLUE SHIELD | Admitting: Obstetrics and Gynecology

## 2022-12-11 ENCOUNTER — Ambulatory Visit (INDEPENDENT_AMBULATORY_CARE_PROVIDER_SITE_OTHER): Payer: BLUE CROSS/BLUE SHIELD | Admitting: Student

## 2022-12-11 ENCOUNTER — Other Ambulatory Visit (HOSPITAL_COMMUNITY): Admission: RE | Admit: 2022-12-11 | Payer: BLUE CROSS/BLUE SHIELD | Source: Ambulatory Visit

## 2022-12-11 VITALS — BP 114/74 | HR 68 | Ht 64.0 in | Wt 161.4 lb

## 2022-12-11 DIAGNOSIS — R1030 Lower abdominal pain, unspecified: Secondary | ICD-10-CM | POA: Diagnosis not present

## 2022-12-11 DIAGNOSIS — Z975 Presence of (intrauterine) contraceptive device: Secondary | ICD-10-CM | POA: Diagnosis not present

## 2022-12-11 DIAGNOSIS — N898 Other specified noninflammatory disorders of vagina: Secondary | ICD-10-CM | POA: Diagnosis present

## 2022-12-11 DIAGNOSIS — R102 Pelvic and perineal pain: Secondary | ICD-10-CM

## 2022-12-11 NOTE — Progress Notes (Signed)
Pt presents for IUD string check. Pt c/o abd cramping since insertion. Denies intercourse since insertion.

## 2022-12-12 LAB — CERVICOVAGINAL ANCILLARY ONLY
Bacterial Vaginitis (gardnerella): POSITIVE — AB
Candida Glabrata: NEGATIVE
Candida Vaginitis: NEGATIVE
Chlamydia: NEGATIVE
Comment: NEGATIVE
Comment: NEGATIVE
Comment: NEGATIVE
Comment: NEGATIVE
Comment: NEGATIVE
Comment: NORMAL
Neisseria Gonorrhea: NEGATIVE
Trichomonas: NEGATIVE

## 2022-12-15 NOTE — Progress Notes (Signed)
  History:  Ms. [place] Bothun is a 22 y.o. [phone] who presents to clinic today for IUD string check and intermittent cramping since placement. Has not tried OTC therapy. Also mentioned today that since having babies she has had pelvic discomfort with certain movements.   The following portions of the patient's history were reviewed and updated as appropriate: allergies, current medications, family history, past medical history, social history, past surgical history and problem list.  Review of Systems:  Review of Systems  Gastrointestinal:        Denies any pain today   All other systems reviewed and are negative.     Objective:  Physical Exam BP 114/74   Pulse 68   Ht 5\' 4"  (1.626 m)   Wt 161 lb 6.4 oz (73.2 kg)   BMI 27.70 kg/m  Physical Exam Vitals and nursing note reviewed. Exam conducted with a chaperone present.  Constitutional:      Appearance: Normal appearance.  Cardiovascular:     Rate and Rhythm: Normal rate.  Pulmonary:     Effort: Pulmonary effort is normal.  Abdominal:     General: Abdomen is flat.     Palpations: Abdomen is soft.     Tenderness: There is no abdominal tenderness.  Genitourinary:    General: Normal vulva.     Vagina: Normal.     Cervix: Normal.     Comments: Strings visualized Skin:    General: Skin is warm and dry.  Neurological:     General: No focal deficit present.     Mental Status: She is alert and oriented to person, place, and time.  Psychiatric:        Mood and Affect: Mood normal.        Behavior: Behavior normal.       Labs and Imaging No results found for this or any previous visit (from the past 24 hour(s)).  No results found.  Health Maintenance Due  Topic Date Due   HPV VACCINES (1 - 3-dose series) Never done   COVID-19 Vaccine (1 - 2023-24 season) Never done    Labs, imaging and previous visits in Epic and Care Everywhere reviewed  Assessment & Plan:  1. IUD (intrauterine device) in place - Strings  visualized  2. Vaginal discharge - Cervicovaginal ancillary only( [place])  3. Pelvic pain - chronic  - Ambulatory referral to Physical Therapy  4. Lower abdominal pain - Onset with IUD placement. Reassurance provided and encouraged NSAID.  - If symptom continues or worsens, recommend follow-up    Approximately 5 minutes of total time was spent with this patient on direct care and assessment, and the remainder of the visit was spent on counseling and coordination of care.  No follow-ups on file.  Corlis Hove, NP 12/15/2022 12:30 PM

## 2022-12-18 ENCOUNTER — Ambulatory Visit: Payer: BLUE CROSS/BLUE SHIELD | Admitting: Physical Therapy

## 2022-12-22 ENCOUNTER — Emergency Department (HOSPITAL_COMMUNITY): Payer: BLUE CROSS/BLUE SHIELD

## 2022-12-22 ENCOUNTER — Emergency Department (HOSPITAL_COMMUNITY)
Admission: EM | Admit: 2022-12-22 | Discharge: 2022-12-22 | Disposition: A | Discharge status: Home / Self Care | Payer: BLUE CROSS/BLUE SHIELD | Attending: Emergency Medicine | Admitting: Emergency Medicine

## 2022-12-22 ENCOUNTER — Other Ambulatory Visit: Payer: Self-pay

## 2022-12-22 ENCOUNTER — Encounter (HOSPITAL_COMMUNITY): Payer: Self-pay

## 2022-12-22 DIAGNOSIS — Z7982 Long term (current) use of aspirin: Secondary | ICD-10-CM | POA: Diagnosis not present

## 2022-12-22 DIAGNOSIS — K8020 Calculus of gallbladder without cholecystitis without obstruction: Secondary | ICD-10-CM | POA: Diagnosis not present

## 2022-12-22 DIAGNOSIS — J45909 Unspecified asthma, uncomplicated: Secondary | ICD-10-CM | POA: Diagnosis not present

## 2022-12-22 DIAGNOSIS — R109 Unspecified abdominal pain: Secondary | ICD-10-CM | POA: Diagnosis present

## 2022-12-22 LAB — BASIC METABOLIC PANEL
Anion gap: 9 (ref 5–15)
BUN: 13 mg/dL (ref 6–20)
CO2: 24 mmol/L (ref 22–32)
Calcium: 8.9 mg/dL (ref 8.9–10.3)
Chloride: 104 mmol/L (ref 98–111)
Creatinine, Ser: 0.89 mg/dL (ref 0.44–1.00)
GFR, Estimated: >60 mL/min (ref >60)
Glucose, Bld: 84 mg/dL (ref 70–99)
Potassium: 4 mmol/L (ref 3.5–5.1)
Sodium: 137 mmol/L (ref 135–145)

## 2022-12-22 LAB — CBC
HCT: 36.6 % (ref 36.0–46.0)
Hemoglobin: 12.3 g/dL (ref 12.0–15.0)
MCH: 30.7 pg (ref 26.0–34.0)
MCHC: 33.6 g/dL (ref 30.0–36.0)
MCV: 91.3 fL (ref 80.0–100.0)
Platelets: 250 10*3/uL (ref 150–400)
RBC: 4.01 MIL/uL (ref 3.87–5.11)
RDW: 11.9 % (ref 11.5–15.5)
WBC: 6.9 10*3/uL (ref 4.0–10.5)
nRBC: 0 % (ref 0.0–0.2)

## 2022-12-22 LAB — HEPATIC FUNCTION PANEL
ALT: 17 U/L (ref 0–44)
AST: 16 U/L (ref 15–41)
Albumin: 3.7 g/dL (ref 3.5–5.0)
Alkaline Phosphatase: 40 U/L (ref 38–126)
Bilirubin, Direct: <0.1 mg/dL (ref 0.0–0.2)
Total Bilirubin: 0.6 mg/dL (ref 0.3–1.2)
Total Protein: 6.8 g/dL (ref 6.5–8.1)

## 2022-12-22 LAB — URINALYSIS, ROUTINE W REFLEX MICROSCOPIC
Bilirubin Urine: NEGATIVE
Glucose, UA: NEGATIVE mg/dL
Ketones, ur: NEGATIVE mg/dL
Leukocytes,Ua: NEGATIVE
Nitrite: NEGATIVE
Protein, ur: NEGATIVE mg/dL
Specific Gravity, Urine: 1.021 (ref 1.005–1.030)
pH: 6 (ref 5.0–8.0)

## 2022-12-22 LAB — HCG, SERUM, QUALITATIVE: Preg, Serum: NEGATIVE

## 2022-12-22 LAB — LIPASE, BLOOD: Lipase: 30 U/L (ref 11–51)

## 2022-12-22 LAB — HIV ANTIBODY (ROUTINE TESTING W REFLEX): HIV Screen 4th Generation wRfx: NONREACTIVE

## 2022-12-22 MED ORDER — LIDOCAINE VISCOUS HCL 2 % MT SOLN
15.0000 mL | Freq: Once | OROMUCOSAL | Status: AC
  Administered 2022-12-22: 15 mL via ORAL
  Filled 2022-12-22: qty 15

## 2022-12-22 MED ORDER — ACETAMINOPHEN 500 MG PO TABS
1000.0000 mg | ORAL_TABLET | Freq: Once | ORAL | Status: AC
  Administered 2022-12-22: 1000 mg via ORAL

## 2022-12-22 MED ORDER — ACETAMINOPHEN 500 MG PO TABS
ORAL_TABLET | ORAL | Status: AC
  Filled 2022-12-22: qty 2

## 2022-12-22 MED ORDER — ALUM & MAG HYDROXIDE-SIMETH 200-200-20 MG/5ML PO SUSP
30.0000 mL | Freq: Once | ORAL | Status: AC
  Administered 2022-12-22: 30 mL via ORAL
  Filled 2022-12-22: qty 30

## 2022-12-22 MED ORDER — ONDANSETRON 4 MG PO TBDP
4.0000 mg | ORAL_TABLET | Freq: Once | ORAL | Status: DC
  Filled 2022-12-22: qty 1

## 2022-12-22 NOTE — ED Provider Notes (Signed)
[place] EMERGENCY DEPARTMENT AT [facility] Provider Note   CSN: [id number] Arrival date & time: 12/22/22  1139     History  No chief complaint on file.   Jacqueline [name] is a 22 y.o. female with past medical history significant for asthma, recent pregnancy in March with PROM who presents with concern for abdominal pain intermittently for 4 months. Patient reports she thinks it is pelvic pain, but has had recent STI check, IUD strings check with OBGYN with no determined cause at this time.  On further questioning patient reports that her pain is just under her sternum, she reports that it happens every couple of days, and usually responds to around 1000 mg of Tylenol.  She denies any nausea, vomiting, diarrhea, fever, chills.  HPI     Home Medications Prior to Admission medications   Medication Sig Start Date End Date Taking? Authorizing Provider  acetaminophen (TYLENOL) 325 MG tablet Take 2 tablets (650 mg total) by mouth every 4 (four) hours as needed (for pain scale < 4). 08/21/22  Yes Cresenzo, Cyndi Lennert, MD  albuterol (VENTOLIN HFA) 108 (90 Base) MCG/ACT inhaler Inhale 2 puffs into the lungs every 4 (four) hours as needed for wheezing or shortness of breath. Patient not taking: Reported on 05/21/2022 05/24/21   Derwood Kaplan, MD  aspirin EC 81 MG tablet Take 1 tablet (81 mg total) by mouth daily. Take after 12 weeks for prevention of preeclampsia later in pregnancy Patient not taking: Reported on 10/17/2022 05/10/22   Hermina Staggers, MD  ibuprofen (ADVIL) 600 MG tablet Take 1 tablet (600 mg total) by mouth every 6 (six) hours. Patient not taking: Reported on 12/22/2022 08/21/22   Celedonio Savage, MD  Prenatal Vit-Fe Fumarate-FA (PREPLUS) 27-1 MG TABS Take 1 tablet by mouth daily. Patient not taking: Reported on 10/17/2022 05/10/22   Hermina Staggers, MD  labetalol (NORMODYNE) 300 MG tablet Take 1 tablet (300 mg total) by mouth 2 (two) times daily. Patient not taking:  Reported on 08/12/2019 10/31/18 08/12/19  Conan Bowens, MD      Allergies    Feraheme [ferumoxytol] and Tomato    Review of Systems   Review of Systems  All other systems reviewed and are negative.   Physical Exam Updated Vital Signs BP 117/75   Pulse (!) 59   Temp 98.4 F (36.9 C) (Oral)   Resp 16   Ht 5\' 4"  (1.626 m)   Wt 73.2 kg   SpO2 100%   BMI 27.70 kg/m  Physical Exam Vitals and nursing note reviewed.  Constitutional:      General: She is not in acute distress.    Appearance: Normal appearance.  HENT:     Head: Normocephalic and atraumatic.  Eyes:     General:        Right eye: No discharge.        Left eye: No discharge.  Cardiovascular:     Rate and Rhythm: Normal rate and regular rhythm.     Heart sounds: No murmur heard.    No friction rub. No gallop.  Pulmonary:     Effort: Pulmonary effort is normal.     Breath sounds: Normal breath sounds.  Abdominal:     General: Bowel sounds are normal.     Palpations: Abdomen is soft.     Comments: Focal tenderness in the epigastric region, rating to the right upper quadrant, no abdominal distention throughout, normal bowel sounds throughout, positive Murphy sign on  exam.  No tenderness to palpation in lower abdomen or suprapubically  Skin:    General: Skin is warm and dry.     Capillary Refill: Capillary refill takes less than 2 seconds.  Neurological:     Mental Status: She is alert and oriented to person, place, and time.  Psychiatric:        Mood and Affect: Mood normal.        Behavior: Behavior normal.     ED Results / Procedures / Treatments   Labs (all labs ordered are listed, but only abnormal results are displayed) Labs Reviewed  URINALYSIS, ROUTINE W REFLEX MICROSCOPIC - Abnormal; Notable for the following components:      Result Value   Hgb urine dipstick SMALL (*)    Bacteria, UA RARE (*)    All other components within normal limits  HCG, SERUM, QUALITATIVE  HIV ANTIBODY (ROUTINE TESTING  W REFLEX)  CBC  BASIC METABOLIC PANEL  LIPASE, BLOOD  HEPATIC FUNCTION PANEL    EKG None  Radiology US Abdomen Limited RUQ (LIVER/GB)  Result Date: 12/22/2022 CLINICAL DATA:  151470 RUQ abdominal pain 151470 EXAM: ULTRASOUND ABDOMEN LIMITED RIGHT UPPER QUADRANT COMPARISON:  CT 10/29/2017 FINDINGS: Gallbladder: Small layering stones in the dependent aspect of the gallbladder. No wall thickening. No pericholecystic fluid. Sonographer described positive sonographic Murphy's sign. Common bile duct: Diameter: 2.6 mm.  No intrahepatic ductal dilatation. Liver: No focal lesion identified. Within normal limits in parenchymal echogenicity. Portal vein is patent on color Doppler imaging with normal direction of blood flow towards the liver. Other: None. IMPRESSION: Cholelithiasis with reported positive sonographic Murphy's sign. No wall thickening or pericholecystic fluid. Electronically Signed   By: Corlis Leak M.D.   On: 12/22/2022 13:45    Procedures Procedures    Medications Ordered in ED Medications  acetaminophen (TYLENOL) 500 MG tablet (  Not Given 12/22/22 1251)  acetaminophen (TYLENOL) tablet 1,000 mg (1,000 mg Oral Given 12/22/22 1153)  alum & mag hydroxide-simeth (MAALOX/MYLANTA) 200-200-20 MG/5ML suspension 30 mL (30 mLs Oral Given 12/22/22 1320)    And  lidocaine (XYLOCAINE) 2 % viscous mouth solution 15 mL (15 mLs Oral Given 12/22/22 1320)    ED Course/ Medical Decision Making/ A&P Clinical Course as of 12/22/22 1514  Sun Dec 22, 2022  1504 Gallstones, awaiting LFTs if normal can go w/ OP FU w/ GS  [BB]    Clinical Course User Index [BB] Fayrene Helper, MD                             Medical Decision Making Amount and/or Complexity of Data Reviewed Labs: ordered. Radiology: ordered.  Risk OTC drugs. Prescription drug management.  This patient is a 22 y.o. female  who presents to the ED for concern of abdominal pain.   Differential diagnoses prior to evaluation: The  emergent differential diagnosis includes, but is not limited to,  esophagitis, gastritis, peptic ulcer disease, esophageal rupture, gastric rupture, Boerhaave's, Mallory-Weiss, pancreatitis, cholecystitis, cholangitis, acute mesenteric ischemia, atypical chest pain or ACS, lower lobar pneumonia versus other . This is not an exhaustive differential.   Past Medical History / Co-morbidities / Social History: Previous pregnancy with premature rupture of membranes earlier this year, otherwise noncontributory*  Physical Exam: Physical exam performed. The pertinent findings include:  Focal tenderness in the epigastric region, rating to the right upper quadrant, no abdominal distention throughout, normal bowel sounds throughout, positive Murphy sign on exam.  No  tenderness to palpation in lower abdomen or suprapubically   Lab Tests/Imaging studies: I personally interpreted labs/imaging and the pertinent results include:  RUQ US shows cholelithiasis with positive sonographic murphy sign. Labs including hepatic function panel are pending at this time. I agree with the radiologist interpretation.   Medications: I ordered medication including tylenol, gi cocktail for pain.  I have reviewed the patients home medicines and have made adjustments as needed.   3:14 PM Care of Jacqueline [name] transferred to Tish Frederickson, MD and Dr. Posey Rea at the end of my shift as the patient will require reassessment once labs/imaging have resulted. Patient presentation, ED course, and plan of care discussed with review of all pertinent labs and imaging. Please see his/her note for further details regarding further ED course and disposition. Plan at time of handoff is pending hepatic function panel, okay to dc with gallbladder eating plan and surgery follow up if labs aren't too concerning. This may be altered or completely changed at the discretion of the oncoming team pending results of further workup.   Final Clinical  Impression(s) / ED Diagnoses Final diagnoses:  Calculus of gallbladder without cholecystitis without obstruction    Rx / DC Orders ED Discharge Orders     None         Olene Floss, PA-C 12/22/22 1514    Loetta Rough, MD 12/29/22 [phone]

## 2022-12-22 NOTE — ED Provider Notes (Signed)
Patient care assumed from previous provider.   Patient care of Jacqueline [name] is a 22 y.o. female from previous provider. Please see the original provider note from this emergency department encounter for full history and physical.   Course of Care and my assessment at the time of sign out is detailed in the ED Course below.   Clinical Course as of 12/22/22 1710  Sun Dec 22, 2022  1504 Gallstones, awaiting LFTs if normal can go w/ OP FU w/ GS  [BB]  1624 Hepatic function panel No evidence of obstruction [BB]  1700 Was notified that patient wished to leave prior to completion of the read on her CT scan.  I discussed at bedside with the patient, she is concerned because she needs to go get ready to go to work tonight.  Asked if we would just call her with any abnormal findings.  This is reasonable, and therefore will discharge patient.  [BB]    Clinical Course User Index [BB] Fayrene Helper, MD     Labs reviewed by myself and considered in medical decision making.  Imaging reviewed by myself and considered in medical decision making. Imaging final read interpreted by radiology.  1. Calculus of gallbladder without cholecystitis without obstruction     I discussed the plan for discharge with the patient and/or their surrogate at bedside prior to discharge and they were in agreement with the plan and verbalized understanding of the return precautions provided. All questions answered to the best of my ability. Ultimately, the patient was discharged in stable condition with stable vital signs. I am reassured that they are capable of close follow up and good social support at home.   DISPO: Discharge   The plan for this patient was discussed with Dr. Posey Rea, who voiced agreement and who oversaw evaluation and treatment of this patient.     Fayrene Helper, MD 12/22/22 2358    Glendora Score, MD 12/23/22 [phone]

## 2022-12-22 NOTE — ED Triage Notes (Signed)
Pt c/o pelvic painx4 mos. Pt denies vaginal discharge or urinary issues. LMP 12/11/22

## 2022-12-22 NOTE — Discharge Instructions (Signed)
Please use Tylenol or ibuprofen for pain.  You may use 600 mg ibuprofen every 6 hours or 1000 mg of Tylenol every 6 hours.  You may choose to alternate between the 2.  This would be most effective.  Not to exceed 4 g of Tylenol within 24 hours.  Not to exceed 3200 mg ibuprofen 24 hours.  Please schedule a visit with the surgeon to discuss gallbladder removal if you continue to have pain related to your gallbladder

## 2022-12-22 NOTE — ED Notes (Signed)
Pt cell phone number verified in chart

## 2022-12-23 ENCOUNTER — Other Ambulatory Visit: Payer: Self-pay | Admitting: Student

## 2022-12-23 DIAGNOSIS — B9689 Other specified bacterial agents as the cause of diseases classified elsewhere: Secondary | ICD-10-CM

## 2022-12-23 DIAGNOSIS — N898 Other specified noninflammatory disorders of vagina: Secondary | ICD-10-CM

## 2022-12-23 MED ORDER — METRONIDAZOLE 500 MG PO TABS
500.0000 mg | ORAL_TABLET | Freq: Two times a day (BID) | ORAL | 0 refills | Status: AC
Start: 2022-12-23 — End: ?

## 2023-01-15 ENCOUNTER — Ambulatory Visit: Payer: BLUE CROSS/BLUE SHIELD | Admitting: Physical Therapy

## 2023-02-24 NOTE — Progress Notes (Signed)
Sent message, via epic in basket, requesting orders in epic from Careers adviser.

## 2023-02-24 NOTE — Progress Notes (Signed)
Second request for pre op orders in [facility]: Spoke with Abigail Butts.

## 2023-02-25 NOTE — Progress Notes (Signed)
Please send pre op orders for PST visit 9/25.24.

## 2023-02-25 NOTE — Patient Instructions (Signed)
SURGICAL WAITING ROOM VISITATION  Patients having surgery or a procedure may have no more than 2 support people in the waiting area - these visitors may rotate.    Children under the age of 41 must have an adult with them who is not the patient.  Due to an increase in RSV and influenza rates and associated hospitalizations, children ages 91 and under may not visit patients in [facility] hospitals.  If the patient needs to stay at the hospital during part of their recovery, the visitor guidelines for inpatient rooms apply. Pre-op nurse will coordinate an appropriate time for 1 support person to accompany patient in pre-op.  This support person may not rotate.    Please refer to the [facility] website for the visitor guidelines for Inpatients (after your surgery is over and you are in a regular room).       Your procedure is scheduled on: 03/05/23   Report to [facility] Main Entrance    Report to admitting at  8:15 AM   Call this number if you have problems the morning of surgery [phone]   Do not eat food :After Midnight.   After Midnight you may have the following liquids until ______ AM/ PM DAY OF SURGERY  Water Non-Citrus Juices (without pulp, NO RED-Apple, White grape, White cranberry) Black Coffee (NO MILK/CREAM OR CREAMERS, sugar ok)  Clear Tea (NO MILK/CREAM OR CREAMERS, sugar ok) regular and decaf                             Plain Jell-O (NO RED)                                           Fruit ices (not with fruit pulp, NO RED)                                     Popsicles (NO RED)                                                               Sports drinks like Gatorade (NO RED)                The day of surgery:  Drink ONE (1) Pre-Surgery Clear Ensure  at AM the morning of surgery. Drink in one sitting. Do not sip.  This drink was given to you during your hospital  pre-op appointment visit. Nothing else to drink after completing the  Pre-Surgery Clear  Ensure    Oral Hygiene is also important to reduce your risk of infection.                                    Remember - BRUSH YOUR TEETH THE MORNING OF SURGERY WITH YOUR REGULAR TOOTHPASTE   Do NOT smoke after Midnight   Stop all vitamins and herbal supplements 7 days before surgery.   Take these medicines the morning of surgery with A SIP OF WATER:  You may not have any metal on your body including hair pins, jewelry, and body piercing             Do not wear make-up, lotions, powders, perfumes/cologne, or deodorant  Do not wear nail polish including gel and S&S, artificial/acrylic nails, or any other type of covering on natural nails including finger and toenails. If you have artificial nails, gel coating, etc. that needs to be removed by a nail salon please have this removed prior to surgery or surgery may need to be canceled/ delayed if the surgeon/ anesthesia feels like they are unable to be safely monitored.   Do not shave  48 hours prior to surgery.    Do not bring valuables to the hospital. [facility] IS NOT             RESPONSIBLE   FOR VALUABLES.   Contacts, glasses, dentures or bridgework may not be worn into surgery.  DO NOT BRING YOUR HOME MEDICATIONS TO THE HOSPITAL. PHARMACY WILL DISPENSE MEDICATIONS LISTED ON YOUR MEDICATION LIST TO YOU DURING YOUR ADMISSION IN THE HOSPITAL!    Patients discharged on the day of surgery will not be allowed to drive home.  Someone NEEDS to stay with you for the first 24 hours after anesthesia.   Special Instructions: Bring a copy of your healthcare power of attorney and living will documents the day of surgery if you haven't scanned them before.              Please read over the following fact sheets you were given: IF YOU HAVE QUESTIONS ABOUT YOUR PRE-OP INSTRUCTIONS PLEASE CALL [phone] Jacqueline [name]   If you received a COVID test during your pre-op visit  it is requested that you wear a mask when out in public, stay away  from anyone that may not be feeling well and notify your surgeon if you develop symptoms. If you test positive for Covid or have been in contact with anyone that has tested positive in the last 10 days please notify you surgeon.    [facility] - Preparing for Surgery Before surgery, you can play an important role.  Because skin is not sterile, your skin needs to be as free of germs as possible.  You can reduce the number of germs on your skin by washing with CHG (chlorahexidine gluconate) soap before surgery.  CHG is an antiseptic cleaner which kills germs and bonds with the skin to continue killing germs even after washing. Please DO NOT use if you have an allergy to CHG or antibacterial soaps.  If your skin becomes reddened/irritated stop using the CHG and inform your nurse when you arrive at Short Stay. Do not shave (including legs and underarms) for at least 48 hours prior to the first CHG shower.  You may shave your face/neck.  Please follow these instructions carefully:  1.  Shower with CHG Soap the night before surgery and the  morning of surgery.  2.  If you choose to wash your hair, wash your hair first as usual with your normal  shampoo.  3.  After you shampoo, rinse your hair and body thoroughly to remove the shampoo.                             4.  Use CHG as you would any other liquid soap.  You can apply chg directly to the skin and wash.  Gently with a scrungie  or clean washcloth.  5.  Apply the CHG Soap to your body ONLY FROM THE NECK DOWN.   Do   not use on face/ open                           Wound or open sores. Avoid contact with eyes, ears mouth and   genitals (private parts).                       Wash face,  Genitals (private parts) with your normal soap.             6.  Wash thoroughly, paying special attention to the area where your    surgery  will be performed.  7.  Thoroughly rinse your body with warm water from the neck down.  8.  DO NOT shower/wash with your normal soap  after using and rinsing off the CHG Soap.                9.  Pat yourself dry with a clean towel.            10.  Wear clean pajamas.            11.  Place clean sheets on your bed the night of your first shower and do not  sleep with pets. Day of Surgery : Do not apply any lotions/deodorants the morning of surgery.  Please wear clean clothes to the hospital/surgery center.  FAILURE TO FOLLOW THESE INSTRUCTIONS MAY RESULT IN THE CANCELLATION OF YOUR SURGERY  PATIENT SIGNATURE_________________________________  NURSE SIGNATURE__________________________________  ________________________________________________________________________

## 2023-02-25 NOTE — Progress Notes (Signed)
COVID Vaccine received:  []  No [x]  Yes Date of any COVID positive Test in last 90 days: no PCP - [facility] Primary [facility] Cardiologist - no  Chest x-ray -  EKG -   Stress Test - no ECHO - no Cardiac Cath - no  Bowel Prep - [x]  No  []   Yes ______  Pacemaker / ICD device [x]  No []  Yes   Spinal Cord Stimulator:[x]  No []  Yes       History of Sleep Apnea? [x]  No []  Yes   CPAP used?- [x]  No []  Yes    Does the patient monitor blood sugar?          [x]  No []  Yes  []  N/A  Patient has: [x]  NO Hx DM   []  Pre-DM                 []  DM1  []   DM2 Does patient have a [employer] or [employer]? []  No []  Yes   Fasting Blood Sugar Ranges-  Checks Blood Sugar _____ times a day  GLP1 agonist / usual dose - no GLP1 instructions:  SGLT-2 inhibitors / usual dose - no SGLT-2 instructions:   Blood Thinner / Instructions:no Aspirin Instructions:no  Comments:   Activity level: Patient is able to climb a flight of stairs without difficulty; [x]  No CP  [x]  No SOB, but would have ___   Patient can perform ADLs without assistance.   Anesthesia review:   Patient denies shortness of breath, fever, cough and chest pain at PAT appointment.  Patient verbalized understanding and agreement to the Pre-Surgical Instructions that were given to them at this PAT appointment. Patient was also educated of the need to review these PAT instructions again prior to his/her surgery.I reviewed the appropriate phone numbers to call if they have any and questions or concerns.

## 2023-02-26 ENCOUNTER — Ambulatory Visit: Payer: Self-pay | Admitting: General Surgery

## 2023-02-26 ENCOUNTER — Encounter (HOSPITAL_COMMUNITY): Payer: Self-pay

## 2023-02-26 ENCOUNTER — Encounter (HOSPITAL_COMMUNITY)
Admission: RE | Admit: 2023-02-26 | Discharge: 2023-02-26 | Disposition: A | Discharge status: Home / Self Care | Payer: BLUE CROSS/BLUE SHIELD | Source: Ambulatory Visit | Attending: General Surgery | Admitting: General Surgery

## 2023-02-26 ENCOUNTER — Other Ambulatory Visit: Payer: Self-pay

## 2023-02-26 VITALS — BP 115/71 | HR 72 | Temp 98.5°F | Resp 16 | Ht 64.0 in | Wt 151.0 lb

## 2023-02-26 DIAGNOSIS — Z01812 Encounter for preprocedural laboratory examination: Secondary | ICD-10-CM | POA: Insufficient documentation

## 2023-02-26 DIAGNOSIS — Z01818 Encounter for other preprocedural examination: Secondary | ICD-10-CM

## 2023-02-26 LAB — CBC
HCT: 36.9 % (ref 36.0–46.0)
Hemoglobin: 12.2 g/dL (ref 12.0–15.0)
MCH: 30.5 pg (ref 26.0–34.0)
MCHC: 33.1 g/dL (ref 30.0–36.0)
MCV: 92.3 fL (ref 80.0–100.0)
Platelets: 233 10*3/uL (ref 150–400)
RBC: 4 MIL/uL (ref 3.87–5.11)
RDW: 11.8 % (ref 11.5–15.5)
WBC: 5.1 10*3/uL (ref 4.0–10.5)
nRBC: 0 % (ref 0.0–0.2)

## 2023-02-26 NOTE — Patient Instructions (Addendum)
SURGICAL WAITING ROOM VISITATION  Patients having surgery or a procedure may have no more than 2 support people in the waiting area - these visitors may rotate.    Children under the age of 49 must have an adult with them who is not the patient.  Due to an increase in RSV and influenza rates and associated hospitalizations, children ages 79 and under may not visit patients in [facility] hospitals.  If the patient needs to stay at the hospital during part of their recovery, the visitor guidelines for inpatient rooms apply. Pre-op nurse will coordinate an appropriate time for 1 support person to accompany patient in pre-op.  This support person may not rotate.    Please refer to the [facility] website for the visitor guidelines for Inpatients (after your surgery is over and you are in a regular room).       Your procedure is scheduled on: 03/05/23   Report to [facility] Main Entrance    Report to admitting at 8:15  AM   Call this number if you have problems the morning of surgery [phone]   Do not eat food :After Midnight.   After Midnight you may have the following liquids until __7:30AM DAY OF SURGERY  Water Non-Citrus Juices (without pulp, NO RED-Apple, White grape, White cranberry) Black Coffee (NO MILK/CREAM OR CREAMERS, sugar ok)  Clear Tea (NO MILK/CREAM OR CREAMERS, sugar ok) regular and decaf                             Plain Jell-O (NO RED)                                           Fruit ices (not with fruit pulp, NO RED)                                     Popsicles (NO RED)                                                               Sports drinks like Gatorade (NO RED)                  The day of surgery:  Drink ONE (1) Pre-Surgery Clear Ensure at 7:30 AM the morning of surgery. Drink in one sitting. Do not sip.  This drink was given to you during your hospital  pre-op appointment visit. Nothing else to drink after completing the  Pre-Surgery Clear  Ensure       Oral Hygiene is also important to reduce your risk of infection.                                    Remember - BRUSH YOUR TEETH THE MORNING OF SURGERY WITH YOUR REGULAR TOOTHPASTE   Stop all vitamins and herbal supplements 7 days before surgery.   Take these medicines the morning of surgery with A SIP OF WATER: tylenol if needed  You may not have any metal on your body including hair pins, jewelry, and body piercing             Do not wear make-up, lotions, powders, perfumes/cologne, or deodorant  Do not wear nail polish including gel and S&S, artificial/acrylic nails, or any other type of covering on natural nails including finger and toenails. If you have artificial nails, gel coating, etc. that needs to be removed by a nail salon please have this removed prior to surgery or surgery may need to be canceled/ delayed if the surgeon/ anesthesia feels like they are unable to be safely monitored.   Do not shave  48 hours prior to surgery.    Do not bring valuables to the hospital. [facility] IS NOT             RESPONSIBLE   FOR VALUABLES.   Contacts, glasses, dentures or bridgework may not be worn into surgery.  DO NOT BRING YOUR HOME MEDICATIONS TO THE HOSPITAL. PHARMACY WILL DISPENSE MEDICATIONS LISTED ON YOUR MEDICATION LIST TO YOU DURING YOUR ADMISSION IN THE HOSPITAL!    Patients discharged on the day of surgery will not be allowed to drive home.  Someone NEEDS to stay with you for the first 24 hours after anesthesia.   Special Instructions: Bring a copy of your healthcare power of attorney and living will documents the day of surgery if you haven't scanned them before.              Please read over the following fact sheets you were given: IF YOU HAVE QUESTIONS ABOUT YOUR PRE-OP INSTRUCTIONS PLEASE CALL [phone] [facility]   If you received a COVID test during your pre-op visit  it is requested that you wear a mask when out in public, stay away from anyone  that may not be feeling well and notify your surgeon if you develop symptoms. If you test positive for Covid or have been in contact with anyone that has tested positive in the last 10 days please notify you surgeon.    [facility] - Preparing for Surgery Before surgery, you can play an important role.  Because skin is not sterile, your skin needs to be as free of germs as possible.  You can reduce the number of germs on your skin by washing with CHG (chlorahexidine gluconate) soap before surgery.  CHG is an antiseptic cleaner which kills germs and bonds with the skin to continue killing germs even after washing. Please DO NOT use if you have an allergy to CHG or antibacterial soaps.  If your skin becomes reddened/irritated stop using the CHG and inform your nurse when you arrive at Short Stay. Do not shave (including legs and underarms) for at least 48 hours prior to the first CHG shower.  You may shave your face/neck.  Please follow these instructions carefully:  1.  Shower with CHG Soap the night before surgery and the  morning of surgery.  2.  If you choose to wash your hair, wash your hair first as usual with your normal  shampoo.  3.  After you shampoo, rinse your hair and body thoroughly to remove the shampoo.                             4.  Use CHG as you would any other liquid soap.  You can apply chg directly to the skin and wash.  Gently with a scrungie  or clean washcloth.  5.  Apply the CHG Soap to your body ONLY FROM THE NECK DOWN.   Do   not use on face/ open                           Wound or open sores. Avoid contact with eyes, ears mouth and   genitals (private parts).                       Wash face,  Genitals (private parts) with your normal soap.             6.  Wash thoroughly, paying special attention to the area where your    surgery  will be performed.  7.  Thoroughly rinse your body with warm water from the neck down.  8.  DO NOT shower/wash with your normal soap after using  and rinsing off the CHG Soap.                9.  Pat yourself dry with a clean towel.            10.  Wear clean pajamas.            11.  Place clean sheets on your bed the night of your first shower and do not  sleep with pets. Day of Surgery : Do not apply any lotions/deodorants the morning of surgery.  Please wear clean clothes to the hospital/surgery center.  FAILURE TO FOLLOW THESE INSTRUCTIONS MAY RESULT IN THE CANCELLATION OF YOUR SURGERY  PATIENT SIGNATURE_________________________________  NURSE SIGNATURE__________________________________  ________________________________________________________________________

## 2023-03-04 NOTE — H&P (Signed)
Chief Complaint: New Consultation (GALLBLADDER)       History of Present Illness: Jacqueline [name] is a 22 y.o. female who is seen today as an office consultation at the request of Dr. Room for evaluation of New Consultation (GALLBLADDER) .   Patient is a 22 year old female who comes in secondary to epigastric abdominal pain.  Patient was recently ER secondary to this pain.  She states that she has had difficulty with eating.  She has nausea vomiting with eating.  She states that she is unable to hold anything down.  She is unable to pinpoint any particular foods.   Patient in the ER and underwent CT scan and ultrasound.  This was significant for multiple gallstones.  Patient with normal LFTs.   She denies any previous abdominal surgery.       Review of Systems: A complete review of systems was obtained from the patient.  I have reviewed this information and discussed as appropriate with the patient.  See HPI as well for other ROS.   Review of Systems  Constitutional:  Negative for fever.  HENT:  Negative for congestion.   Eyes:  Negative for blurred vision.  Respiratory:  Negative for cough, shortness of breath and wheezing.   Cardiovascular:  Negative for chest pain and palpitations.  Gastrointestinal:  Positive for abdominal pain, nausea and vomiting. Negative for heartburn.  Genitourinary:  Negative for dysuria.  Musculoskeletal:  Negative for myalgias.  Skin:  Negative for rash.  Neurological:  Negative for dizziness and headaches.  Psychiatric/Behavioral:  Negative for depression and suicidal ideas.   All other systems reviewed and are negative.       Medical History: Past Medical History History reviewed. No pertinent past medical history.     Problem List There is no problem list on file for this patient.     Past Surgical History History reviewed. No pertinent surgical history.     Allergies Allergies Allergen Reactions  Penicillin Hives       Medications Ordered Prior to Encounter No current outpatient medications on file prior to visit.    No current facility-administered medications on file prior to visit.      Family History Family History Problem Relation Age of Onset  Diabetes Sister        Tobacco Use History Social History    Tobacco Use Smoking Status Never Smokeless Tobacco Never      Social History Social History    Socioeconomic History  Marital status: Single Tobacco Use  Smoking status: Never  Smokeless tobacco: Never Vaping Use  Vaping status: Never Used Substance and Sexual Activity  Alcohol use: Not Currently  Drug use: Never    Social Determinants of Health    Financial Resource Strain: Low Risk  (10/20/2018)   Received from [facility]   Overall Financial Resource Strain (CARDIA)    Difficulty of Paying Living Expenses: Not hard at all Food Insecurity: No Food Insecurity (08/18/2022)   Received from [facility]   Hunger Vital Sign    Worried About Running Out of Food in the Last Year: Never true    Ran Out of Food in the Last Year: Never true Transportation Needs: No Transportation Needs (08/18/2022)   Received from [facility] - Transportation    Lack of Transportation (Medical): No    Lack of Transportation (Non-Medical): No Stress: No Stress Concern Present (10/20/2018)   Received from [facility] - Occupational Stress Questionnaire  Feeling of Stress : Only a little      Objective:     Vitals:   01/15/23 1529 BP: 119/71 Pulse: 70 Temp: 37.1 C (98.8 F) SpO2: 98% Weight: 73.1 kg (161 lb 3.2 oz) Height: 162.6 cm (5\' 4" ) PainSc: 0-No pain   Body mass index is 27.67 kg/m.   Physical Exam Constitutional:      Appearance: Normal appearance.  HENT:     Head: Normocephalic and atraumatic.     Mouth/Throat:     Mouth: Mucous membranes are moist.     Pharynx: Oropharynx is clear.  Eyes:     General:  No scleral icterus.    Pupils: Pupils are equal, round, and reactive to light.  Cardiovascular:     Rate and Rhythm: Normal rate and regular rhythm.     Pulses: Normal pulses.     Heart sounds: No murmur heard.    No friction rub. No gallop.  Pulmonary:     Effort: Pulmonary effort is normal. No respiratory distress.     Breath sounds: Normal breath sounds. No stridor.  Abdominal:     General: Abdomen is flat.  Musculoskeletal:        General: No swelling.  Skin:    General: Skin is warm.  Neurological:     General: No focal deficit present.     Mental Status: She is alert and oriented to person, place, and time. Mental status is at baseline.  Psychiatric:        Mood and Affect: Mood normal.        Thought Content: Thought content normal.        Judgment: Judgment normal.        Assessment and Plan: Diagnoses and all orders for this visit:   Symptomatic cholelithiasis     Jacqueline [name] is a 22 y.o. female    1.  We will proceed to the OR for a lap cholecystectomy. 2. All risks and benefits were discussed with the patient to generally include: infection, bleeding, possible need for post op ERCP, damage to the bile ducts, and bile leak. Alternatives were offered and described.  All questions were answered and the patient voiced understanding of the procedure and wishes to proceed at this point with a laparoscopic cholecystectomy           No follow-ups on file.   Axel Filler, MD, [facility], [facility]

## 2023-03-05 ENCOUNTER — Ambulatory Visit (HOSPITAL_COMMUNITY): Payer: BLUE CROSS/BLUE SHIELD | Admitting: Anesthesiology

## 2023-03-05 ENCOUNTER — Encounter (HOSPITAL_COMMUNITY): Payer: Self-pay | Admitting: General Surgery

## 2023-03-05 ENCOUNTER — Other Ambulatory Visit: Payer: Self-pay

## 2023-03-05 ENCOUNTER — Encounter (HOSPITAL_COMMUNITY)
Admission: RE | Disposition: A | Discharge status: Home / Self Care | Payer: Self-pay | Source: Ambulatory Visit | Attending: General Surgery

## 2023-03-05 ENCOUNTER — Ambulatory Visit (HOSPITAL_COMMUNITY)
Admission: RE | Admit: 2023-03-05 | Discharge: 2023-03-05 | Disposition: A | Discharge status: Home / Self Care | Payer: BLUE CROSS/BLUE SHIELD | Source: Ambulatory Visit | Attending: General Surgery | Admitting: General Surgery

## 2023-03-05 DIAGNOSIS — Z01818 Encounter for other preprocedural examination: Secondary | ICD-10-CM

## 2023-03-05 DIAGNOSIS — I10 Essential (primary) hypertension: Secondary | ICD-10-CM | POA: Insufficient documentation

## 2023-03-05 DIAGNOSIS — Z87891 Personal history of nicotine dependence: Secondary | ICD-10-CM | POA: Diagnosis not present

## 2023-03-05 DIAGNOSIS — K8020 Calculus of gallbladder without cholecystitis without obstruction: Secondary | ICD-10-CM | POA: Diagnosis present

## 2023-03-05 DIAGNOSIS — K8010 Calculus of gallbladder with chronic cholecystitis without obstruction: Secondary | ICD-10-CM | POA: Insufficient documentation

## 2023-03-05 HISTORY — PX: CHOLECYSTECTOMY: SHX55

## 2023-03-05 LAB — POCT PREGNANCY, URINE: Preg Test, Ur: NEGATIVE

## 2023-03-05 SURGERY — LAPAROSCOPIC CHOLECYSTECTOMY
Anesthesia: General | Site: Abdomen

## 2023-03-05 MED ORDER — ONDANSETRON HCL 4 MG/2ML IJ SOLN
INTRAMUSCULAR | Status: DC | PRN
  Administered 2023-03-05: 4 mg via INTRAVENOUS

## 2023-03-05 MED ORDER — FENTANYL CITRATE PF 50 MCG/ML IJ SOSY
25.0000 ug | PREFILLED_SYRINGE | INTRAMUSCULAR | Status: DC | PRN
  Administered 2023-03-05: 50 ug via INTRAVENOUS

## 2023-03-05 MED ORDER — LIDOCAINE HCL (PF) 2 % IJ SOLN
INTRAMUSCULAR | Status: AC
  Filled 2023-03-05: qty 5

## 2023-03-05 MED ORDER — FENTANYL CITRATE (PF) 100 MCG/2ML IJ SOLN
INTRAMUSCULAR | Status: DC | PRN
  Administered 2023-03-05: 100 ug via INTRAVENOUS
  Administered 2023-03-05: 50 ug via INTRAVENOUS

## 2023-03-05 MED ORDER — ACETAMINOPHEN 500 MG PO TABS
1000.0000 mg | ORAL_TABLET | ORAL | Status: AC
  Administered 2023-03-05: 1000 mg via ORAL
  Filled 2023-03-05: qty 2

## 2023-03-05 MED ORDER — FENTANYL CITRATE (PF) 100 MCG/2ML IJ SOLN
INTRAMUSCULAR | Status: AC
  Filled 2023-03-05: qty 2

## 2023-03-05 MED ORDER — ROCURONIUM BROMIDE 10 MG/ML (PF) SYRINGE
PREFILLED_SYRINGE | INTRAVENOUS | Status: DC | PRN
  Administered 2023-03-05: 50 mg via INTRAVENOUS

## 2023-03-05 MED ORDER — MIDAZOLAM HCL 2 MG/2ML IJ SOLN
INTRAMUSCULAR | Status: DC | PRN
  Administered 2023-03-05: 2 mg via INTRAVENOUS

## 2023-03-05 MED ORDER — CEFAZOLIN SODIUM-DEXTROSE 2-4 GM/100ML-% IV SOLN
2.0000 g | INTRAVENOUS | Status: AC
  Administered 2023-03-05: 2 g via INTRAVENOUS
  Filled 2023-03-05: qty 100

## 2023-03-05 MED ORDER — SUGAMMADEX SODIUM 200 MG/2ML IV SOLN
INTRAVENOUS | Status: DC | PRN
  Administered 2023-03-05: 280 mg via INTRAVENOUS

## 2023-03-05 MED ORDER — ENSURE PRE-SURGERY PO LIQD
296.0000 mL | Freq: Once | ORAL | Status: DC
  Filled 2023-03-05: qty 296

## 2023-03-05 MED ORDER — MIDAZOLAM HCL 2 MG/2ML IJ SOLN
INTRAMUSCULAR | Status: AC
  Filled 2023-03-05: qty 2

## 2023-03-05 MED ORDER — BUPIVACAINE-EPINEPHRINE 0.25% -1:200000 IJ SOLN
INTRAMUSCULAR | Status: AC
  Filled 2023-03-05: qty 1

## 2023-03-05 MED ORDER — LACTATED RINGERS IV SOLN: INTRAVENOUS | Status: DC

## 2023-03-05 MED ORDER — CELECOXIB 200 MG PO CAPS
200.0000 mg | ORAL_CAPSULE | Freq: Once | ORAL | Status: AC
  Administered 2023-03-05: 200 mg via ORAL
  Filled 2023-03-05: qty 1

## 2023-03-05 MED ORDER — OXYCODONE HCL 5 MG/5ML PO SOLN: 5.0000 mg | Freq: Once | ORAL | Status: DC | PRN

## 2023-03-05 MED ORDER — CHLORHEXIDINE GLUCONATE 0.12 % MT SOLN
15.0000 mL | Freq: Once | OROMUCOSAL | Status: AC
  Administered 2023-03-05: 15 mL via OROMUCOSAL

## 2023-03-05 MED ORDER — TRAMADOL HCL 50 MG PO TABS
50.0000 mg | ORAL_TABLET | Freq: Four times a day (QID) | ORAL | 0 refills | Status: AC | PRN
Start: 2023-03-05 — End: 2024-03-04

## 2023-03-05 MED ORDER — LIDOCAINE 2% (20 MG/ML) 5 ML SYRINGE
INTRAMUSCULAR | Status: DC | PRN
  Administered 2023-03-05: 60 mg via INTRAVENOUS

## 2023-03-05 MED ORDER — 0.9 % SODIUM CHLORIDE (POUR BTL) OPTIME
TOPICAL | Status: DC | PRN
Start: 2023-03-05 — End: 2023-03-05
  Administered 2023-03-05: 1000 mL

## 2023-03-05 MED ORDER — LACTATED RINGERS IV SOLN
INTRAVENOUS | Status: DC | PRN
  Administered 2023-03-05: 1000 mL

## 2023-03-05 MED ORDER — PROPOFOL 10 MG/ML IV BOLUS
INTRAVENOUS | Status: AC
  Filled 2023-03-05: qty 20

## 2023-03-05 MED ORDER — ONDANSETRON HCL 4 MG/2ML IJ SOLN
INTRAMUSCULAR | Status: AC
  Filled 2023-03-05: qty 2

## 2023-03-05 MED ORDER — FENTANYL CITRATE PF 50 MCG/ML IJ SOSY
PREFILLED_SYRINGE | INTRAMUSCULAR | Status: AC
  Filled 2023-03-05: qty 1

## 2023-03-05 MED ORDER — CHLORHEXIDINE GLUCONATE CLOTH 2 % EX PADS: 6.0000 | MEDICATED_PAD | Freq: Once | CUTANEOUS | Status: DC

## 2023-03-05 MED ORDER — ACETAMINOPHEN 500 MG PO TABS: 1000.0000 mg | ORAL_TABLET | Freq: Once | ORAL | Status: DC

## 2023-03-05 MED ORDER — ORAL CARE MOUTH RINSE: 15.0000 mL | Freq: Once | OROMUCOSAL | Status: AC

## 2023-03-05 MED ORDER — OXYCODONE HCL 5 MG PO TABS: 5.0000 mg | ORAL_TABLET | Freq: Once | ORAL | Status: DC | PRN

## 2023-03-05 MED ORDER — PROPOFOL 10 MG/ML IV BOLUS
INTRAVENOUS | Status: DC | PRN
Start: 2023-03-05 — End: 2023-03-05
  Administered 2023-03-05: 150 mg via INTRAVENOUS

## 2023-03-05 MED ORDER — BUPIVACAINE-EPINEPHRINE (PF) 0.25% -1:200000 IJ SOLN
INTRAMUSCULAR | Status: DC | PRN
  Administered 2023-03-05: 7 mL

## 2023-03-05 MED ORDER — DEXAMETHASONE SODIUM PHOSPHATE 10 MG/ML IJ SOLN
INTRAMUSCULAR | Status: DC | PRN
  Administered 2023-03-05: 6 mg via INTRAVENOUS

## 2023-03-05 MED ORDER — DEXAMETHASONE SODIUM PHOSPHATE 10 MG/ML IJ SOLN
INTRAMUSCULAR | Status: AC
  Filled 2023-03-05: qty 1

## 2023-03-05 SURGICAL SUPPLY — 39 items
ADH SKN CLS APL DERMABOND .7 (GAUZE/BANDAGES/DRESSINGS) ×1
APL PRP STRL LF DISP 70% ISPRP (MISCELLANEOUS) ×1
APPLIER CLIP 5 13 M/L LIGAMAX5 (MISCELLANEOUS) ×1
APR CLP MED LRG 5 ANG JAW (MISCELLANEOUS) ×1
BAG COUNTER SPONGE SURGICOUNT (BAG) IMPLANT
BAG SPEC RTRVL 10 TROC 200 (ENDOMECHANICALS) ×1
BAG SPNG CNTER NS LX DISP (BAG)
CABLE HIGH FREQUENCY MONO STRZ (ELECTRODE) ×1 IMPLANT
CHLORAPREP W/TINT 26 (MISCELLANEOUS) ×1 IMPLANT
CLIP APPLIE 5 13 M/L LIGAMAX5 (MISCELLANEOUS) IMPLANT
CLIP LIGATING HEMO O LOK GREEN (MISCELLANEOUS) ×1 IMPLANT
COVER MAYO STAND XLG (MISCELLANEOUS) IMPLANT
COVER PROBE U/S 5X48 (MISCELLANEOUS) IMPLANT
DERMABOND ADVANCED .7 DNX12 (GAUZE/BANDAGES/DRESSINGS) ×1 IMPLANT
DRAPE C-ARM 42X120 X-RAY (DRAPES) IMPLANT
ELECT REM PT RETURN 15FT ADLT (MISCELLANEOUS) ×1 IMPLANT
GAUZE SPONGE 2X2 8PLY STRL LF (GAUZE/BANDAGES/DRESSINGS) ×1 IMPLANT
GLOVE BIO SURGEON STRL SZ7.5 (GLOVE) ×1 IMPLANT
GOWN STRL REUS W/ TWL XL LVL3 (GOWN DISPOSABLE) ×1 IMPLANT
GOWN STRL REUS W/TWL XL LVL3 (GOWN DISPOSABLE) ×1
GRASPER SUT TROCAR 14GX15 (MISCELLANEOUS) IMPLANT
IRRIG SUCT STRYKERFLOW 2 WTIP (MISCELLANEOUS) ×1
IRRIGATION SUCT STRKRFLW 2 WTP (MISCELLANEOUS) ×1 IMPLANT
KIT BASIN OR (CUSTOM PROCEDURE TRAY) ×1 IMPLANT
KIT TURNOVER KIT A (KITS) IMPLANT
NDL INSUFFLATION 14GA 120MM (NEEDLE) ×1 IMPLANT
NEEDLE INSUFFLATION 14GA 120MM (NEEDLE) ×1
PENCIL SMOKE EVACUATOR (MISCELLANEOUS) IMPLANT
POUCH RETRIEVAL ECOSAC 10 (ENDOMECHANICALS) IMPLANT
SCISSORS LAP 5X35 DISP (ENDOMECHANICALS) ×1 IMPLANT
SET CHOLANGIOGRAPH MIX (MISCELLANEOUS) IMPLANT
SET TUBE SMOKE EVAC HIGH FLOW (TUBING) ×1 IMPLANT
SLEEVE Z-THREAD 5X100MM (TROCAR) ×1 IMPLANT
SPIKE FLUID TRANSFER (MISCELLANEOUS) ×1 IMPLANT
SUT MNCRL AB 4-0 PS2 18 (SUTURE) ×1 IMPLANT
TOWEL OR 17X26 10 PK STRL BLUE (TOWEL DISPOSABLE) ×1 IMPLANT
TRAY LAPAROSCOPIC (CUSTOM PROCEDURE TRAY) ×1 IMPLANT
TROCAR 11X100 Z THREAD (TROCAR) ×1 IMPLANT
TROCAR Z-THREAD OPTICAL 5X100M (TROCAR) ×1 IMPLANT

## 2023-03-05 NOTE — Anesthesia Preprocedure Evaluation (Signed)
Anesthesia Evaluation  Patient identified by MRN, date of birth, ID band Patient awake    Reviewed: Allergy & Precautions, NPO status , Patient's Chart, lab work & pertinent test results  Airway Mallampati: II  TM Distance: >3 FB Neck ROM: Full    Dental no notable dental hx.    Pulmonary asthma , former smoker   Pulmonary exam normal        Cardiovascular hypertension,  Rhythm:Regular Rate:Normal     Neuro/Psych  Headaches  negative psych ROS   GI/Hepatic Neg liver ROS,,,Gallstones    Endo/Other  negative endocrine ROS    Renal/GU negative Renal ROS  negative genitourinary   Musculoskeletal negative musculoskeletal ROS (+)    Abdominal Normal abdominal exam  (+)   Peds  Hematology Lab Results      Component                Value               Date                      WBC                      5.1                 02/26/2023                HGB                      12.2                02/26/2023                HCT                      36.9                02/26/2023                MCV                      92.3                02/26/2023                PLT                      233                 02/26/2023             Lab Results      Component                Value               Date                      NA                       137                 12/22/2022                K  4.0                 12/22/2022                CO2                      24                  12/22/2022                GLUCOSE                  84                  12/22/2022                BUN                      13                  12/22/2022                CREATININE               0.89                12/22/2022                CALCIUM                  8.9                 12/22/2022                EGFR                     127                 05/10/2022                GFRNONAA                 >60                 12/22/2022               Anesthesia Other Findings   Reproductive/Obstetrics                             Anesthesia Physical Anesthesia Plan  ASA: 2  Anesthesia Plan: General   Post-op Pain Management:    Induction: Intravenous  PONV Risk Score and Plan: 3 and Ondansetron, Dexamethasone, Midazolam and Treatment may vary due to age or medical condition  Airway Management Planned: Mask and Oral ETT  Additional Equipment: None  Intra-op Plan:   Post-operative Plan: Extubation in OR  Informed Consent: I have reviewed the patients History and Physical, chart, labs and discussed the procedure including the risks, benefits and alternatives for the proposed anesthesia with the patient or authorized representative who has indicated his/her understanding and acceptance.     Dental advisory given  Plan Discussed with: CRNA  Anesthesia Plan Comments:        Anesthesia Quick Evaluation

## 2023-03-05 NOTE — Interval H&P Note (Signed)
History and Physical Interval Note:  03/05/2023 9:45 AM  Jacqueline [name]  has presented today for surgery, with the diagnosis of GALLSTONES.  The various methods of treatment have been discussed with the patient and family. After consideration of risks, benefits and other options for treatment, the patient has consented to  Procedure(s): LAPAROSCOPIC CHOLECYSTECTOMY (N/A) as a surgical intervention.  The patient's history has been reviewed, patient examined, no change in status, stable for surgery.  I have reviewed the patient's chart and labs.  Questions were answered to the patient's satisfaction.     Axel Filler

## 2023-03-05 NOTE — Anesthesia Procedure Notes (Signed)
Procedure Name: Intubation Date/Time: 03/05/2023 10:16 AM  Performed by: Florene Route, CRNAPatient Re-evaluated:Patient Re-evaluated prior to induction Oxygen Delivery Method: Circle system utilized Preoxygenation: Pre-oxygenation with 100% oxygen Induction Type: IV induction Laryngoscope Size: 2 and Miller Grade View: Grade I Tube type: Oral Tube size: 7.0 mm Number of attempts: 1 Airway Equipment and Method: Stylet Placement Confirmation: ETT inserted through vocal cords under direct vision, positive ETCO2 and breath sounds checked- equal and bilateral Secured at: 20 cm Tube secured with: Tape Dental Injury: Teeth and Oropharynx as per pre-operative assessment

## 2023-03-05 NOTE — Transfer of Care (Signed)
Immediate Anesthesia Transfer of Care Note  Patient: Jacqueline [name]  Procedure(s) Performed: LAPAROSCOPIC CHOLECYSTECTOMY (Abdomen)  Patient Location: PACU  Anesthesia Type:General  Level of Consciousness: drowsy  Airway & Oxygen Therapy: Patient Spontanous Breathing and Patient connected to face mask oxygen  Post-op Assessment: Report given to RN and Post -op Vital signs reviewed and stable  Post vital signs: Reviewed and stable  Last Vitals:  Vitals Value Taken Time  BP    Temp    Pulse 65 03/05/23 1105  Resp 17 03/05/23 1105  SpO2 100 % 03/05/23 1105  Vitals shown include unfiled device data.  Last Pain:  Vitals:   03/05/23 0929  TempSrc: Oral         Complications: No notable events documented.

## 2023-03-05 NOTE — Op Note (Signed)
03/05/2023  10:50 AM  PATIENT:  Jacqueline [name]  22 y.o. female  PRE-OPERATIVE DIAGNOSIS:  GALLSTONES  POST-OPERATIVE DIAGNOSIS:  GALLSTONES  PROCEDURE:  Procedure(s): LAPAROSCOPIC CHOLECYSTECTOMY (N/A)  SURGEON:  Surgeons and Role:    Axel Filler, MD - Primary  ANESTHESIA:   local and general  EBL:  10 mL   BLOOD ADMINISTERED:none  DRAINS: none   LOCAL MEDICATIONS USED:  BUPIVICAINE   SPECIMEN:  Source of Specimen:  gallbladder  DISPOSITION OF SPECIMEN:  PATHOLOGY  COUNTS:  YES  TOURNIQUET:  * No tourniquets in log *  DICTATION: .Dragon Dictation   EBL: <5cc   Complications: none   Counts: reported as correct x 2   Findings:chronically inflamed gallbladder  Indications for procedure: Pt is a 60F with RUQ pain and seen to have gallstones.   Details of the procedure: The patient was taken to the operating and placed in the supine position with bilateral SCDs in place. A time out was called and all facts were verified. A pneumoperitoneum was obtained via A Veress needle technique to a pressure of 14mm of mercury. A 5mm trochar was then placed in the right upper quadrant under visualization, and there were no injuries to any abdominal organs. A 11 mm port was then placed in the umbilical region after infiltrating with local anesthesia under direct visualization. A second epigastric port was placed under direct visualization.   The gallbladder was identified and retracted, the peritoneum was then sharply dissected from the gallbladder and this dissection was carried down to Calot's triangle. The cystic duct was identified and dissected circumferentially and seen going into the gallbladder 360.  The cystic artery was dissected away from the surrounding tissues.   The critical angle was obtained.   2 clips were placed proximally one distally and the cystic duct transected. The cystic artery was identified and 2 clips placed proximally and one distally and  transected. We then proceeded to remove the gallbladder off the hepatic fossa with Bovie cautery. A retrieval bag was then placed in the abdomen and gallbladder placed in the bag. The hepatic fossa was then reexamined and hemostasis was achieved with Bovie cautery and was excellent at this portion of the case. The subhepatic fossa and perihepatic fossa was then irrigated until the effluent was clear. The specimen bag and specimen were removed from the abdominal cavity.  The 11 mm trocar fascia was reapproximated with the Endo Close #1 Vicryl x1. The pneumoperitoneum was evacuated and all trochars removed under direct visulalization. The skin was then closed with 4-0 Monocryl and the skin dressed with Dermabond. The patient was awaken from general anesthesia and taken to the recovery room in stable condition.   PLAN OF CARE: Discharge to home after PACU  PATIENT DISPOSITION:  PACU - hemodynamically stable.   Delay start of Pharmacological VTE agent (>24hrs) due to surgical blood loss or risk of bleeding: not applicable

## 2023-03-05 NOTE — Anesthesia Postprocedure Evaluation (Signed)
Anesthesia Post Note  Patient: [place] Gair  Procedure(s) Performed: LAPAROSCOPIC CHOLECYSTECTOMY (Abdomen)     Patient location during evaluation: PACU Anesthesia Type: General Level of consciousness: awake and alert Pain management: pain level controlled Vital Signs Assessment: post-procedure vital signs reviewed and stable Respiratory status: spontaneous breathing, nonlabored ventilation, respiratory function stable and patient connected to nasal cannula oxygen Cardiovascular status: blood pressure returned to baseline and stable Postop Assessment: no apparent nausea or vomiting Anesthetic complications: no   No notable events documented.  Last Vitals:  Vitals:   03/05/23 1255 03/05/23 1312  BP:    Pulse: (!) 50   Resp: 12   Temp:  36.5 C  SpO2: 98%     Last Pain:  Vitals:   03/05/23 1312  TempSrc: Oral  PainSc:                  Nelle Don Darshana Curnutt

## 2023-03-05 NOTE — Discharge Instructions (Signed)

## 2023-03-06 ENCOUNTER — Encounter (HOSPITAL_COMMUNITY): Payer: Self-pay | Admitting: General Surgery

## 2023-03-10 LAB — SURGICAL PATHOLOGY

## 2023-05-16 ENCOUNTER — Ambulatory Visit (INDEPENDENT_AMBULATORY_CARE_PROVIDER_SITE_OTHER): Payer: BLUE CROSS/BLUE SHIELD

## 2023-05-16 VITALS — BP 105/57 | HR 62 | Ht 64.0 in | Wt 154.6 lb

## 2023-05-16 DIAGNOSIS — Z30011 Encounter for initial prescription of contraceptive pills: Secondary | ICD-10-CM

## 2023-05-16 DIAGNOSIS — Z30432 Encounter for removal of intrauterine contraceptive device: Secondary | ICD-10-CM | POA: Diagnosis not present

## 2023-05-16 MED ORDER — SLYND 4 MG PO TABS: 1.0000 | ORAL_TABLET | Freq: Every day | ORAL | 3 refills | Status: AC

## 2023-05-16 NOTE — Progress Notes (Signed)
Pt. Presents for IUD check. Pt. Says that she is having pain and pressure where the IUD is placed. Pt. Would like to be tested for pregnancy. Pt. Feels that IUD may not be in correctly.

## 2023-05-16 NOTE — Progress Notes (Signed)
    GYNECOLOGY OFFICE PROCEDURE NOTE  [place] Silber is a 22 y.o. [phone] here for Mirena IUD removal. No GYN concerns.  Last pap smear was on May 2024 and was normal. Patient reports she has been having pain x 3 weeks that is constant pressure.  She reports that she thinks the IUD has migrated. Patient desires oral contraception.  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   IUD Removal  Patient identified, informed consent performed, consent signed.  Patient was in the dorsal lithotomy position, normal external genitalia was noted.  A speculum was placed in the patient's vagina, bloody discharge was noted, no lesions. The cervix was visualized, no lesions, no abnormal discharge.  The strings of the IUD were grasped and pulled using ring forceps. The IUD was removed in its entirety. Patient tolerated the procedure well.    Patient will use Slynd for contraception.  Discussed daily usage and back up contraception with missed pills. Instructed to start pills today. Routine preventative health maintenance measures emphasized.   Cherre Robins MSN, CNM Advanced Practice Provider, [facility]

## 2024-07-07 ENCOUNTER — Telehealth: Payer: MEDICAID | Admitting: Physician Assistant

## 2024-07-07 DIAGNOSIS — R52 Pain, unspecified: Secondary | ICD-10-CM

## 2024-07-07 DIAGNOSIS — Z20828 Contact with and (suspected) exposure to other viral communicable diseases: Secondary | ICD-10-CM

## 2024-07-07 DIAGNOSIS — R509 Fever, unspecified: Secondary | ICD-10-CM

## 2024-07-07 DIAGNOSIS — R051 Acute cough: Secondary | ICD-10-CM | POA: Diagnosis not present

## 2024-07-07 DIAGNOSIS — J4521 Mild intermittent asthma with (acute) exacerbation: Secondary | ICD-10-CM

## 2024-07-07 DIAGNOSIS — R6889 Other general symptoms and signs: Secondary | ICD-10-CM

## 2024-07-07 MED ORDER — PROMETHAZINE-DM 6.25-15 MG/5ML PO SYRP: 5.0000 mL | ORAL_SOLUTION | Freq: Four times a day (QID) | ORAL | 0 refills | Status: AC | PRN

## 2024-07-07 MED ORDER — OSELTAMIVIR PHOSPHATE 75 MG PO CAPS: 75.0000 mg | ORAL_CAPSULE | Freq: Two times a day (BID) | ORAL | 0 refills | Status: AC

## 2024-07-07 MED ORDER — ALBUTEROL SULFATE HFA 108 (90 BASE) MCG/ACT IN AERS: 1.0000 | INHALATION_SPRAY | Freq: Four times a day (QID) | RESPIRATORY_TRACT | 0 refills | Status: AC | PRN

## 2024-07-07 MED ORDER — IBUPROFEN 600 MG PO TABS: 600.0000 mg | ORAL_TABLET | Freq: Three times a day (TID) | ORAL | 0 refills | Status: AC | PRN

## 2024-07-07 NOTE — Progress Notes (Signed)
 " Virtual Visit Consent   Jacqueline [name], you are scheduled for a virtual visit with a [facility] provider today. Just as with appointments in the office, your consent must be obtained to participate. Your consent will be active for this visit and any virtual visit you may have with one of our providers in the next 365 days. If you have a MyChart account, a copy of this consent can be sent to you electronically.  As this is a virtual visit, video technology does not allow for your provider to perform a traditional examination. This may limit your provider's ability to fully assess your condition. If your provider identifies any concerns that need to be evaluated in person or the need to arrange testing (such as labs, EKG, etc.), we will make arrangements to do so. Although advances in technology are sophisticated, we cannot ensure that it will always work on either your end or our end. If the connection with a video visit is poor, the visit may have to be switched to a telephone visit. With either a video or telephone visit, we are not always able to ensure that we have a secure connection.  By engaging in this virtual visit, you consent to the provision of healthcare and authorize for your insurance to be billed (if applicable) for the services provided during this visit. Depending on your insurance coverage, you may receive a charge related to this service.  I need to obtain your verbal consent now. Are you willing to proceed with your visit today? Jacqueline [name] has provided verbal consent on 07/07/2024 for a virtual visit (video or telephone). Jacqueline CHRISTELLA Dickinson, PA-C  Date: 07/07/2024 4:16 PM   Virtual Visit via Video Note   I, Jacqueline [name], connected with  Jacqueline [name]  ([id number], 10-Feb-2001) on 07/07/24 at  4:00 PM EST by a video-enabled telemedicine application and verified that I am speaking with the correct person using two identifiers.  Location: Patient: Virtual Visit  Location Patient: Home Provider: Virtual Visit Location Provider: Home Office   I discussed the limitations of evaluation and management by telemedicine and the availability of in person appointments. The patient expressed understanding and agreed to proceed.    History of Present Illness: Jacqueline [name] is a 24 y.o. who identifies as a female who was assigned female at birth, and is being seen today for flu-like symptoms.  HPI: URI  This is a new problem. The current episode started yesterday. The problem has been unchanged. The maximum temperature recorded prior to her arrival was 103 - 104 F (103.5). The fever has been present for Less than 1 day. Associated symptoms include congestion, coughing (intermittently productive), diarrhea, headaches, rhinorrhea, a sore throat (mild) and wheezing (only with activity). Pertinent negatives include no ear pain, nausea, plugged ear sensation, sinus pain or vomiting. Associated symptoms comments: Chills, body aches, eyes watering. She has tried acetaminophen  (tylenol , alka seltzer severe cold) for the symptoms. The treatment provided no relief.   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  Problems:  Patient Active Problem List   Diagnosis Date Noted   Unwanted fertility 05/10/2022   Current nicotine use 10/06/2019    Allergies: Allergies[1] Medications: Current Medications[2]  Observations/Objective: Patient is well-developed, well-nourished in no acute distress.  Resting comfortably at home.  Head is normocephalic, atraumatic.  No labored breathing.  Speech is clear and coherent with logical content.  Patient is alert and oriented at baseline.  Assessment and Plan: 1. Fever, unspecified fever cause (Primary) - oseltamivir  (TAMIFLU ) 75 MG capsule; Take 1 capsule (75 mg total) by mouth 2 (two) times daily.  Dispense: 10 capsule; Refill: 0 - ibuprofen  (ADVIL ) 600 MG tablet; Take 1 tablet (600  mg total) by mouth every 8 (eight) hours as needed.  Dispense: 30 tablet; Refill: 0  2. Body aches - oseltamivir  (TAMIFLU ) 75 MG capsule; Take 1 capsule (75 mg total) by mouth 2 (two) times daily.  Dispense: 10 capsule; Refill: 0 - ibuprofen  (ADVIL ) 600 MG tablet; Take 1 tablet (600 mg total) by mouth every 8 (eight) hours as needed.  Dispense: 30 tablet; Refill: 0  3. Acute cough - oseltamivir  (TAMIFLU ) 75 MG capsule; Take 1 capsule (75 mg total) by mouth 2 (two) times daily.  Dispense: 10 capsule; Refill: 0 - promethazine -dextromethorphan (PROMETHAZINE -DM) 6.25-15 MG/5ML syrup; Take 5 mLs by mouth 4 (four) times daily as needed.  Dispense: 118 mL; Refill: 0  4. Flu-like symptoms - oseltamivir  (TAMIFLU ) 75 MG capsule; Take 1 capsule (75 mg total) by mouth 2 (two) times daily.  Dispense: 10 capsule; Refill: 0 - ibuprofen  (ADVIL ) 600 MG tablet; Take 1 tablet (600 mg total) by mouth every 8 (eight) hours as needed.  Dispense: 30 tablet; Refill: 0 - promethazine -dextromethorphan (PROMETHAZINE -DM) 6.25-15 MG/5ML syrup; Take 5 mLs by mouth 4 (four) times daily as needed.  Dispense: 118 mL; Refill: 0  5. Exposure to the flu - oseltamivir  (TAMIFLU ) 75 MG capsule; Take 1 capsule (75 mg total) by mouth 2 (two) times daily.  Dispense: 10 capsule; Refill: 0  6. Mild intermittent asthma with exacerbation - albuterol  (VENTOLIN  HFA) 108 (90 Base) MCG/ACT inhaler; Inhale 1-2 puffs into the lungs every 6 (six) hours as needed.  Dispense: 8 g; Refill: 0  - Suspect influenza due to symptoms and positive exposure - Tamiflu  prescribed - Promethazine  DM for cough - Ibuprofen  for body aches - Albuterol  for asthma/wheezing - Continue OTC medication of choice for symptomatic management - Push fluids - Rest - Work note provided - Seek in person evaluation if symptoms worsen or fail to improve   Follow Up Instructions: I discussed the assessment and treatment plan with the patient. The patient was provided  an opportunity to ask questions and all were answered. The patient agreed with the plan and demonstrated an understanding of the instructions.  A copy of instructions were sent to the patient via MyChart unless otherwise noted below.    The patient was advised to call back or seek an in-person evaluation if the symptoms worsen or if the condition fails to improve as anticipated.    Jacqueline CHRISTELLA Dickinson, PA-C     [1]  Allergies Allergen Reactions   Feraheme  [Ferumoxytol ] Other (See Comments)    Nausea, back pain, dizziness   Tomato Other (See Comments)    Makes asthma worse  [2]  Current Outpatient Medications:    albuterol  (VENTOLIN  HFA) 108 (90 Base) MCG/ACT inhaler, Inhale 1-2 puffs into the lungs every 6 (six) hours as needed., Disp: 8 g, Rfl: 0   ibuprofen  (ADVIL ) 600 MG tablet, Take 1 tablet (600 mg total) by mouth every 8 (eight) hours as needed., Disp: 30 tablet, Rfl: 0   oseltamivir  (TAMIFLU ) 75 MG capsule, Take 1 capsule (75 mg total) by mouth 2 (two) times daily., Disp: 10 capsule, Rfl: 0   promethazine -dextromethorphan (PROMETHAZINE -DM) 6.25-15 MG/5ML syrup, Take 5 mLs by mouth 4 (four) times daily as needed., Disp: 118 mL, Rfl: 0  acetaminophen  (TYLENOL ) 325 MG tablet, Take 2 tablets (650 mg total) by mouth every 4 (four) hours as needed (for pain scale < 4)., Disp: 60 tablet, Rfl: 0   aspirin  EC 81 MG tablet, Take 1 tablet (81 mg total) by mouth daily. Take after 12 weeks for prevention of preeclampsia later in pregnancy (Patient not taking: Reported on 10/17/2022), Disp: 300 tablet, Rfl: 2   Drospirenone  (SLYND ) 4 MG TABS, Take 1 tablet (4 mg total) by mouth daily., Disp: 90 tablet, Rfl: 3   metroNIDAZOLE  (FLAGYL ) 500 MG tablet, Take 1 tablet (500 mg total) by mouth 2 (two) times daily. (Patient not taking: Reported on 02/24/2023), Disp: 14 tablet, Rfl: 0   Multiple Vitamin (MULTIVITAMIN WITH MINERALS) TABS tablet, Take 1 tablet by mouth daily., Disp: , Rfl:   "

## 2024-07-07 NOTE — Patient Instructions (Signed)
 " Jacqueline [name], thank you for joining Delon CHRISTELLA Dickinson, PA-C for today's virtual visit.  While this provider is not your primary care provider (PCP), if your PCP is located in our provider database this encounter information will be shared with them immediately following your visit.   A [facility] MyChart account gives you access to today's visit and all your visits, tests, and labs performed at [facility]  click here if you don't have a  MyChart account or go to [email or web address]  Consent: (Patient) Jacqueline [name] provided verbal consent for this virtual visit at the beginning of the encounter.  Current Medications:  Current Outpatient Medications:    albuterol  (VENTOLIN  HFA) 108 (90 Base) MCG/ACT inhaler, Inhale 1-2 puffs into the lungs every 6 (six) hours as needed., Disp: 8 g, Rfl: 0   ibuprofen  (ADVIL ) 600 MG tablet, Take 1 tablet (600 mg total) by mouth every 8 (eight) hours as needed., Disp: 30 tablet, Rfl: 0   oseltamivir  (TAMIFLU ) 75 MG capsule, Take 1 capsule (75 mg total) by mouth 2 (two) times daily., Disp: 10 capsule, Rfl: 0   promethazine -dextromethorphan (PROMETHAZINE -DM) 6.25-15 MG/5ML syrup, Take 5 mLs by mouth 4 (four) times daily as needed., Disp: 118 mL, Rfl: 0   acetaminophen  (TYLENOL ) 325 MG tablet, Take 2 tablets (650 mg total) by mouth every 4 (four) hours as needed (for pain scale < 4)., Disp: 60 tablet, Rfl: 0   aspirin  EC 81 MG tablet, Take 1 tablet (81 mg total) by mouth daily. Take after 12 weeks for prevention of preeclampsia later in pregnancy (Patient not taking: Reported on 10/17/2022), Disp: 300 tablet, Rfl: 2   Drospirenone  (SLYND ) 4 MG TABS, Take 1 tablet (4 mg total) by mouth daily., Disp: 90 tablet, Rfl: 3   metroNIDAZOLE  (FLAGYL ) 500 MG tablet, Take 1 tablet (500 mg total) by mouth 2 (two) times daily. (Patient not taking: Reported on 02/24/2023), Disp: 14 tablet, Rfl: 0   Multiple Vitamin (MULTIVITAMIN WITH MINERALS)  TABS tablet, Take 1 tablet by mouth daily., Disp: , Rfl:    Medications ordered in this encounter:  Meds ordered this encounter  Medications   oseltamivir  (TAMIFLU ) 75 MG capsule    Sig: Take 1 capsule (75 mg total) by mouth 2 (two) times daily.    Dispense:  10 capsule    Refill:  0    Supervising Provider:   LAMPTEY, PHILIP O [[id number]]   albuterol  (VENTOLIN  HFA) 108 (90 Base) MCG/ACT inhaler    Sig: Inhale 1-2 puffs into the lungs every 6 (six) hours as needed.    Dispense:  8 g    Refill:  0    Supervising Provider:   BLAISE ALEENE KIDD [[id number]]   ibuprofen  (ADVIL ) 600 MG tablet    Sig: Take 1 tablet (600 mg total) by mouth every 8 (eight) hours as needed.    Dispense:  30 tablet    Refill:  0    Supervising Provider:   LAMPTEY, PHILIP O [[id number]]   promethazine -dextromethorphan (PROMETHAZINE -DM) 6.25-15 MG/5ML syrup    Sig: Take 5 mLs by mouth 4 (four) times daily as needed.    Dispense:  118 mL    Refill:  0    Supervising Provider:   BLAISE ALEENE KIDD [[id number]]     *If you need refills on other medications prior to your next appointment, please contact your pharmacy*  Follow-Up: Call back or seek an in-person evaluation if the symptoms worsen or if the condition fails to improve as  anticipated.     Other Instructions The Flu (Influenza) in Adults: What to Know Influenza is also called the flu. It's an infection that affects your respiratory tract. This includes your nose, throat, windpipe, and lungs. The flu is contagious. This means it spreads easily from person to person. It causes symptoms that are like a cold. It can also cause a high fever and body aches. What are the causes? The flu is caused by the influenza virus. You can get it by: Breathing in droplets that are in the air after an infected person coughs or sneezes. Touching something that has the virus on it and then touching your mouth, nose, or eyes. What increases the risk? You may be more likely to get  the flu if: You don't wash your hands often. You're near a lot of people during cold and flu season. You touch your mouth, eyes, or nose without washing your hands first. You don't get a flu shot each year. You may also be more at risk for the flu and serious problems, such as a lung infection called pneumonia, if: You're older than 65. You're pregnant. Your immune system is weak. Your immune system is your body's defense system. You have a long-term, or chronic, condition, such as: Heart, kidney, or lung disease. Diabetes. A liver disorder. Asthma. You're very overweight. You have anemia. This is when you don't have enough red blood cells in your body. What are the signs or symptoms? Flu symptoms often start all of a sudden. They may last 4-14 days and include: Fever and chills. Headaches, body aches, or muscle aches. Sore throat. Cough. Runny or stuffy nose. Discomfort in your chest. Not wanting to eat as much as normal. Feeling weak or tired. Feeling dizzy. Nausea or vomiting. How is this diagnosed? The flu may be diagnosed based on your symptoms and medical history. You may also have a physical exam. A swab may be taken from your nose or throat and tested for the virus. How is this treated? If the flu is found early, you can be treated with antiviral medicine. This may be given to you by mouth or through an IV. It can help you feel less sick and get better faster. Taking care of yourself at home can also help your symptoms get better. Your health care provider may tell you to: Take over-the-counter medicines. Drink lots of fluids. The flu often goes away on its own. If you have very bad symptoms or problems caused by the flu, you may need to be treated in a hospital. Follow these instructions at home: Activity Rest as needed. Get lots of sleep. Stay home from work or school as told by your provider. Leave home only to go see your provider. Do not leave home for other  reasons until you don't have a fever for 24 hours without taking medicine. Eating and drinking Take an oral rehydration solution (ORS). This is a drink that is sold at pharmacies and stores. Drink enough fluid to keep your pee pale yellow. Try to drink small amounts of clear fluids. These include water, ice chips, fruit juice mixed with water, and low-calorie sports drinks. Try to eat bland foods that are easy to digest. These include bananas, applesauce, rice, lean meats, toast, and crackers. Avoid drinks that have a lot of sugar or caffeine  in them. These include energy drinks, regular sports drinks, and soda. Do not drink alcohol. Do not eat spicy or fatty foods. General instructions  Take your medicines only as told by your provider. Use a cool mist humidifier to add moisture to the air in your home. This can make it easier for you to breathe. You should also clean the humidifier every day. To do so: Empty the water. Pour clean water in. Cover your mouth and nose when you cough or sneeze. Wash your hands with soap and water often and for at least 20 seconds. It's extra important to do so after you cough or sneeze. If you can't use soap and water, use hand sanitizer. How is this prevented?  Get a flu shot every year. Ask your provider when you should get your flu shot. Stay away from people who are sick during fall and winter. Fall and winter are cold and flu season. Contact a health care provider if: You get new symptoms. You have chest pain. You have watery poop, also called diarrhea. You have a fever. Your cough gets worse. You start to have more mucus. You feel like you may vomit, or you vomit. Get help right away if: You become short of breath or have trouble breathing. Your skin or nails turn blue. You have very bad pain or stiffness in your neck. You get a sudden headache or pain in your face or ear. You vomit each time you eat or drink. These symptoms may be an  emergency. Call 911 right away. Do not wait to see if the symptoms will go away. Do not drive yourself to the hospital. This information is not intended to replace advice given to you by your health care provider. Make sure you discuss any questions you have with your health care provider. Document Revised: 03/30/2024 Document Reviewed: 06/27/2022 Elsevier Patient Education  [employer].   If you have been instructed to have an in-person evaluation today at a local Urgent Care facility, please use the link below. It will take you to a list of all of our available [facility], including address, phone number and hours of operation. Please do not delay care.  [facility]  If you or a family member do not have a primary care provider, use the link below to schedule a visit and establish care. When you choose a [facility] primary care physician or advanced practice provider, you gain a long-term partner in health. Find a Primary Care Provider  Learn more about [facility]'s in-office and virtual care options: [facility] - Get Care Now "
